# Patient Record
Sex: Male | Born: 1950
Health system: Southern US, Community
[De-identification: ages and names within clinical notes are randomized; demographics above are authoritative.]

## PROBLEM LIST (undated history)

## (undated) ENCOUNTER — Emergency Department (HOSPITAL_COMMUNITY): Admission: EM | Payer: Medicare Other | Source: Home / Self Care

## (undated) DIAGNOSIS — K5790 Diverticulosis of intestine, part unspecified, without perforation or abscess without bleeding: Secondary | ICD-10-CM

## (undated) DIAGNOSIS — I739 Peripheral vascular disease, unspecified: Secondary | ICD-10-CM

## (undated) DIAGNOSIS — I714 Abdominal aortic aneurysm, without rupture, unspecified: Secondary | ICD-10-CM

## (undated) DIAGNOSIS — I519 Heart disease, unspecified: Secondary | ICD-10-CM

## (undated) DIAGNOSIS — N2889 Other specified disorders of kidney and ureter: Secondary | ICD-10-CM

## (undated) DIAGNOSIS — Z8679 Personal history of other diseases of the circulatory system: Secondary | ICD-10-CM

## (undated) DIAGNOSIS — I10 Essential (primary) hypertension: Secondary | ICD-10-CM

## (undated) DIAGNOSIS — I7410 Embolism and thrombosis of unspecified parts of aorta: Secondary | ICD-10-CM

## (undated) DIAGNOSIS — C649 Malignant neoplasm of unspecified kidney, except renal pelvis: Secondary | ICD-10-CM

## (undated) DIAGNOSIS — I700 Atherosclerosis of aorta: Secondary | ICD-10-CM

## (undated) DIAGNOSIS — N281 Cyst of kidney, acquired: Secondary | ICD-10-CM

## (undated) DIAGNOSIS — Z905 Acquired absence of kidney: Secondary | ICD-10-CM

## (undated) DIAGNOSIS — I701 Atherosclerosis of renal artery: Secondary | ICD-10-CM

## (undated) DIAGNOSIS — K219 Gastro-esophageal reflux disease without esophagitis: Secondary | ICD-10-CM

## (undated) DIAGNOSIS — I517 Cardiomegaly: Secondary | ICD-10-CM

## (undated) DIAGNOSIS — E785 Hyperlipidemia, unspecified: Secondary | ICD-10-CM

## (undated) DIAGNOSIS — D1779 Benign lipomatous neoplasm of other sites: Secondary | ICD-10-CM

## (undated) DIAGNOSIS — G459 Transient cerebral ischemic attack, unspecified: Secondary | ICD-10-CM

## (undated) HISTORY — PX: VASCULAR SURGERY: SHX849

## (undated) HISTORY — PX: NEPHRECTOMY: SHX65

## (undated) HISTORY — DX: Atherosclerosis of renal artery: I70.1

## (undated) HISTORY — DX: Peripheral vascular disease, unspecified: I73.9

## (undated) HISTORY — PX: CHOLECYSTECTOMY: SHX55

## (undated) HISTORY — PX: COLONOSCOPY: SHX174

## (undated) HISTORY — DX: Hyperlipidemia, unspecified: E78.5

---

## 2013-04-24 DIAGNOSIS — G459 Transient cerebral ischemic attack, unspecified: Secondary | ICD-10-CM

## 2013-04-24 HISTORY — DX: Transient cerebral ischemic attack, unspecified: G45.9

## 2013-05-03 DIAGNOSIS — R200 Anesthesia of skin: Secondary | ICD-10-CM | POA: Insufficient documentation

## 2013-08-24 HISTORY — PX: RENAL ARTERY STENT: SHX2321

## 2013-12-07 ENCOUNTER — Encounter (HOSPITAL_COMMUNITY): Payer: Self-pay | Admitting: Emergency Medicine

## 2013-12-07 ENCOUNTER — Emergency Department (HOSPITAL_COMMUNITY): Payer: BC Managed Care – PPO

## 2013-12-07 ENCOUNTER — Emergency Department (HOSPITAL_COMMUNITY)
Admission: EM | Admit: 2013-12-07 | Discharge: 2013-12-07 | Disposition: A | Discharge status: Home / Self Care | Payer: BC Managed Care – PPO | Attending: Emergency Medicine | Admitting: Emergency Medicine

## 2013-12-07 DIAGNOSIS — I10 Essential (primary) hypertension: Secondary | ICD-10-CM | POA: Insufficient documentation

## 2013-12-07 DIAGNOSIS — E785 Hyperlipidemia, unspecified: Secondary | ICD-10-CM | POA: Insufficient documentation

## 2013-12-07 DIAGNOSIS — Z87891 Personal history of nicotine dependence: Secondary | ICD-10-CM | POA: Insufficient documentation

## 2013-12-07 DIAGNOSIS — Z8673 Personal history of transient ischemic attack (TIA), and cerebral infarction without residual deficits: Secondary | ICD-10-CM | POA: Insufficient documentation

## 2013-12-07 HISTORY — DX: Essential (primary) hypertension: I10

## 2013-12-07 HISTORY — DX: Transient cerebral ischemic attack, unspecified: G45.9

## 2013-12-07 LAB — CBC
HCT: 49.3 % (ref 39.0–52.0)
HEMOGLOBIN: 17.7 g/dL — AB (ref 13.0–17.0)
MCH: 33.1 pg (ref 26.0–34.0)
MCHC: 35.9 g/dL (ref 30.0–36.0)
MCV: 92.3 fL (ref 78.0–100.0)
Platelets: 144 10*3/uL — ABNORMAL LOW (ref 150–400)
RBC: 5.34 MIL/uL (ref 4.22–5.81)
RDW: 13.5 % (ref 11.5–15.5)
WBC: 8 10*3/uL (ref 4.0–10.5)

## 2013-12-07 LAB — I-STAT TROPONIN, ED: Troponin i, poc: 0 ng/mL (ref 0.00–0.08)

## 2013-12-07 LAB — I-STAT CHEM 8, ED
BUN: 19 mg/dL (ref 6–23)
CHLORIDE: 102 meq/L (ref 96–112)
CREATININE: 1.6 mg/dL — AB (ref 0.50–1.35)
Calcium, Ion: 1.17 mmol/L (ref 1.13–1.30)
GLUCOSE: 86 mg/dL (ref 70–99)
HEMATOCRIT: 52 % (ref 39.0–52.0)
Hemoglobin: 17.7 g/dL — ABNORMAL HIGH (ref 13.0–17.0)
Potassium: 3.8 mEq/L (ref 3.7–5.3)
SODIUM: 141 meq/L (ref 137–147)
TCO2: 29 mmol/L (ref 0–100)

## 2013-12-07 NOTE — ED Provider Notes (Signed)
CSN: [id number]     Arrival date & time 12/07/13  2045 History   First MD Initiated Contact with Patient 12/07/13 2139     Chief Complaint  Patient presents with  . Hypertension     (Consider location/radiation/quality/duration/timing/severity/associated sxs/prior Treatment) The history is provided by the patient and medical records.   History of present illness: 63 year old male who presented from urgent care for hypertension and concern for possible change on EKG. Patient presented to urgent care today because his blood pressure was elevated to 190/100. After getting off work today. He had seen his PCP yesterday who refilled his blood pressure medication. He was concerned that his blood pressure was still elevated so went to urgent care. He has not had any chest pain, shortness of breath, nausea, vomiting, diaphoresis today or at any time. EKG was obtained because patient was hypertensive. No history of coronary artery disease.  Past Medical History  Diagnosis Date  . Hypertension   . TIA (transient ischemic attack)    Past Surgical History  Procedure Laterality Date  . Cholecystectomy     History reviewed. No pertinent family history. History  Substance Use Topics  . Smoking status: Former Research scientist (life sciences)  . Smokeless tobacco: Not on file  . Alcohol Use: No    Review of Systems  Constitutional: Negative for fever and chills.  HENT: Negative.   Eyes: Negative.   Respiratory: Negative for cough and shortness of breath.   Cardiovascular: Negative for chest pain and palpitations.  Gastrointestinal: Negative for nausea, vomiting, abdominal pain, diarrhea and constipation.  Genitourinary: Negative.   Musculoskeletal: Negative.   Skin: Negative.   Neurological: Negative.   All other systems reviewed and are negative.     Allergies  Review of patient's allergies indicates no known allergies.  Home Medications   Prior to Admission medications   Not on File   BP 172/91  Pulse  59  Temp(Src) 98.5 F (36.9 C) (Oral)  Resp 18  Ht 6\' 2"  (1.88 m)  Wt 180 lb (81.647 kg)  BMI 23.10 kg/m2  SpO2 96% Physical Exam  Nursing note and vitals reviewed. Constitutional: He is oriented to person, place, and time. He appears well-developed and well-nourished. No distress.  HENT:  Head: Normocephalic and atraumatic.  Eyes: Conjunctivae are normal.  Neck: Neck supple.  Cardiovascular: Normal rate, regular rhythm, normal heart sounds and intact distal pulses.   Pulmonary/Chest: Effort normal and breath sounds normal. He has no wheezes. He has no rales.  Abdominal: Soft. He exhibits no distension. There is no tenderness.  Musculoskeletal: Normal range of motion.  Neurological: He is alert and oriented to person, place, and time.  Skin: Skin is warm and dry.    ED Course  Procedures (including critical care time) Labs Review Labs Reviewed  CBC - Abnormal; Notable for the following:    Hemoglobin 17.7 (*)    Platelets 144 (*)    All other components within normal limits  I-STAT CHEM 8, ED - Abnormal; Notable for the following:    Creatinine, Ser 1.60 (*)    Hemoglobin 17.7 (*)    All other components within normal limits  I-STAT TROPOININ, ED    Imaging Review Dg Chest 2 View  12/07/2013   CLINICAL DATA:  Right lower extremity numbness for several months  EXAM: CHEST  2 VIEW  COMPARISON:  None.  FINDINGS: The heart size and mediastinal contours are within normal limits. Both lungs are clear. The visualized skeletal structures are unremarkable.  IMPRESSION:  No active cardiopulmonary disease.   Electronically Signed   By: Kathreen Devoid   On: 12/07/2013 21:36     EKG Interpretation   Date/Time:  Thursday December 07 2013 21:01:11 EDT Ventricular Rate:  66 PR Interval:  144 QRS Duration: 92 QT Interval:  400 QTC Calculation: 419 R Axis:   76 Text Interpretation:  Normal sinus rhythm Normal ECG No old tracing to  compare Confirmed by GOLDSTON  MD, [place] ([id number]) on  12/07/2013 9:39:33 PM      MDM   Final diagnoses:  Hypertension    63 year old male with history of hypertension and hyperlipidemia was sent from urgent care with concern for possible EKG changes in the setting of hypertension. Patient has had no chest pain, shortness of breath, diaphoresis, nausea, vomiting, abdominal pain at any time. He presented to urgent care only because his blood pressure was high. EKG obtained at urgent care only because of patient's hypertension. On arrival here it was 184/95 with no intervention.  EKG from outside reviewed and new obtained here were essentially the same. Patient with only nonspecific T wave changes in aVL. No signs of acute ischemia on EKG.  Labs were obtained through triage as part of protocol. Troponin negative and labs otherwise unremarkable. As patient is asymptomatic and has had no history of chest pain or symptoms concerning for ACS, do not feel additional workup necessary at this time. Patient has close PCP followup arranged for the morning for adjustment of antihypertensives.    Date: 12/07/2013  Rate: 59  Rhythm: sinus bradycardia  QRS Axis: normal  Intervals: normal  ST/T Wave abnormalities: nonspecific T wave changes  Conduction Disutrbances:none  Narrative Interpretation: Non-specific T wave changes with otherwise normal EKG  Old EKG Reviewed: none available    Renaldo Reel, MD 12/07/13 2351

## 2013-12-07 NOTE — ED Notes (Signed)
Showed Dr. Wilson Singer [place] EKG and [place] 12 lead EKG

## 2013-12-07 NOTE — Discharge Instructions (Signed)
Followup with your doctor as soon as possible for adjustment of blood pressure medication.  SEEK IMMEDIATE MEDICAL ATTENTION IF: You have severe chest pain, especially if the pain is crushing or pressure-like and spreads to the arms, back, neck, or jaw, or if you have sweating, nausea (feeling sick to your stomach), or shortness of breath. THIS IS AN EMERGENCY. Don't wait to see if the pain will go away. Get medical help at once. Call 911 or 0 (operator). DO NOT drive yourself to the hospital.   You wake from sleep with chest pain or shortness of breath.  You feel dizzy or faint.  You have difficulty speaking. You have numbness, tingling, or weakness in your face, arms, or legs.

## 2013-12-07 NOTE — ED Notes (Signed)
Pt. Was transferred from ucc in [place] b/c of changes in 12 lead ecg. Pt. Had no cp on their visit but was given ntg sl. 0.4 mg and 325 mg asa. Pt. Went to ucc for just refills on bp meds. And asymptomatic.

## 2013-12-08 NOTE — ED Provider Notes (Signed)
I saw and evaluated the patient, reviewed the resident's note and I agree with the findings and plan.   EKG Interpretation   Date/Time:  Thursday December 07 2013 21:01:11 EDT Ventricular Rate:  66 PR Interval:  144 QRS Duration: 92 QT Interval:  400 QTC Calculation: 419 R Axis:   76 Text Interpretation:  Normal sinus rhythm Normal ECG No old tracing to  compare Confirmed by Milbern Doescher  MD, [place] Maiolo ([id number]) on 12/07/2013 9:39:33 PM       Patient with asymptomatic hypertension, referred from urgent care. EKG shows evidence of LVH but no acute ischemia. As such she also has no current chest symptoms and has not been having any chest symptoms recently. His blood pressure is poorly controlled and is on multiple blood pressure medicines. As he is asymptomatic and he is already scheduled to call his PCP tomorrow at I will discharge the patient and recommended close PCP first thing in the morning to help regulate better blood pressure control. I discussed return precautions including chest pain, stroke-like symptoms, dyspnea, or other concerning symptoms.  Ephraim Hamburger, MD 12/08/13 [phone]

## 2013-12-18 ENCOUNTER — Other Ambulatory Visit: Payer: Self-pay | Admitting: *Deleted

## 2013-12-18 DIAGNOSIS — M79609 Pain in unspecified limb: Secondary | ICD-10-CM

## 2013-12-18 DIAGNOSIS — I10 Essential (primary) hypertension: Secondary | ICD-10-CM

## 2013-12-18 DIAGNOSIS — R0989 Other specified symptoms and signs involving the circulatory and respiratory systems: Secondary | ICD-10-CM

## 2014-01-25 ENCOUNTER — Encounter: Payer: Self-pay | Admitting: Vascular Surgery

## 2014-01-26 ENCOUNTER — Ambulatory Visit (INDEPENDENT_AMBULATORY_CARE_PROVIDER_SITE_OTHER): Payer: BC Managed Care – PPO | Admitting: Vascular Surgery

## 2014-01-26 ENCOUNTER — Encounter (HOSPITAL_COMMUNITY): Payer: Self-pay | Admitting: Pharmacist

## 2014-01-26 ENCOUNTER — Ambulatory Visit (HOSPITAL_COMMUNITY)
Admission: RE | Admit: 2014-01-26 | Discharge: 2014-01-26 | Disposition: A | Discharge status: Home / Self Care | Payer: BC Managed Care – PPO | Source: Ambulatory Visit | Attending: Vascular Surgery | Admitting: Vascular Surgery

## 2014-01-26 ENCOUNTER — Ambulatory Visit (INDEPENDENT_AMBULATORY_CARE_PROVIDER_SITE_OTHER)
Admission: RE | Admit: 2014-01-26 | Discharge: 2014-01-26 | Disposition: A | Discharge status: Home / Self Care | Payer: BC Managed Care – PPO | Source: Ambulatory Visit | Attending: Vascular Surgery | Admitting: Vascular Surgery

## 2014-01-26 ENCOUNTER — Encounter: Payer: Self-pay | Admitting: Vascular Surgery

## 2014-01-26 VITALS — BP 168/85 | HR 49 | Ht 74.0 in | Wt 175.0 lb

## 2014-01-26 DIAGNOSIS — M79609 Pain in unspecified limb: Secondary | ICD-10-CM

## 2014-01-26 DIAGNOSIS — R0989 Other specified symptoms and signs involving the circulatory and respiratory systems: Secondary | ICD-10-CM | POA: Insufficient documentation

## 2014-01-26 DIAGNOSIS — I70219 Atherosclerosis of native arteries of extremities with intermittent claudication, unspecified extremity: Secondary | ICD-10-CM

## 2014-01-26 DIAGNOSIS — I701 Atherosclerosis of renal artery: Secondary | ICD-10-CM | POA: Insufficient documentation

## 2014-01-26 DIAGNOSIS — I10 Essential (primary) hypertension: Secondary | ICD-10-CM | POA: Insufficient documentation

## 2014-01-26 NOTE — Progress Notes (Signed)
Referred by:  [name]  Reason for referral: poorly controlled HTN and right leg numbness  History of Present Illness  Jeffrey Stout is a 63 y.o. (25-Mar-1951) male who presents with chief complaint: Right leg numbness.  Onset of symptom occurred years ago without obvious trigger.  Pt denies pain but notes numbness from right hip down to leg with ambulation.  Patient notes this resolves with rest.  The patient has no rest pain or classic intermittent claudication symptoms. He has never had ulcers or gangrene.  Additionally, this patient with known history of HTN since 63 y/o has had recently escalation in his BP despite being on 5 agents per the patient.  He was recently seen in the ED for poorly controlled BP.  The patient denies any change in urinary habits.  He denies any sx consistent with a pheochromocytoma.  Atherosclerotic risk factors include: HTN, prior smoking history.  Past Medical History  Diagnosis Date  . Hypertension   . TIA (transient ischemic attack)     Past Surgical History  Procedure Laterality Date  . Cholecystectomy      History   Social History  . Marital Status: Married    Spouse Name: N/A    Number of Children: N/A  . Years of Education: N/A   Occupational History  . Not on file.   Social History Main Topics  . Smoking status: Former Smoker    Quit date: 04/24/2013  . Smokeless tobacco: Not on file  . Alcohol Use: No  . Drug Use: No  . Sexual Activity: Not on file   Other Topics Concern  . Not on file   Social History Narrative  . No narrative on file    Family History  Problem Relation Age of Onset  . Diabetes Mother   . Hypertension Brother   . Heart attack Brother     Current Outpatient Prescriptions  Medication Sig Dispense Refill  . amLODipine (NORVASC) 10 MG tablet Take 10 mg by mouth daily.      . benazepril (LOTENSIN) 40 MG tablet Take 40 mg by mouth daily.      . carvedilol (COREG) 6.25 MG tablet Take 6.25 mg by  mouth.      . cloNIDine (CATAPRES - DOSED IN MG/24 HR) 0.1 mg/24hr patch Place 1 patch onto the skin.      . hydrochlorothiazide (HYDRODIURIL) 25 MG tablet Take 25 mg by mouth daily.      . pravastatin (PRAVACHOL) 40 MG tablet Take 40 mg by mouth.      Marland Kitchen aspirin 81 MG tablet Take 81 mg by mouth daily.       No current facility-administered medications for this visit.     No Known Allergies  REVIEW OF SYSTEMS:  (Positives checked otherwise negative)  CARDIOVASCULAR:  [ ]  chest pain, [ ]  chest pressure, [ ]  palpitations, [ ]  shortness of breath when laying flat, [x]  shortness of breath with exertion,   [x]  pain in feet when walking, [ ]  pain in feet when laying flat, [ ]  history of blood clot in veins (DVT), [ ]  history of phlebitis, [ ]  swelling in legs, [ ]  varicose veins  PULMONARY:  [ ]  productive cough, [ ]  asthma, [ ]  wheezing  NEUROLOGIC:  [ ]  weakness in arms or legs, [x]  numbness in arms or legs, [ ]  difficulty speaking or slurred speech, [ ]  temporary loss of vision in one eye, [ ]  dizziness  HEMATOLOGIC:  [ ]   bleeding problems, [ ]  problems with blood clotting too easily  MUSCULOSKEL:  [ ]  joint pain, [ ]  joint swelling  GASTROINTEST:  [ ]   Vomiting blood, [ ]   Blood in stool     GENITOURINARY:  [ ]   Burning with urination, [ ]   Blood in urine  PSYCHIATRIC:  [ ]  history of major depression  INTEGUMENTARY:  [ ]  rashes, [ ]  ulcers  CONSTITUTIONAL:  [ ]  fever, [ ]  chills  For VQI Use Only  PRE-ADM LIVING: Home  AMB STATUS: Ambulatory  CAD Sx: None  PRIOR CHF: None  STRESS TEST: [x]  No, [ ]  Normal, [ ]  + ischemia, [ ]  + MI, [ ]  Both  Physical Examination Filed Vitals:   01/26/14 0951  BP: 168/85  Pulse: 49  Height: 6\' 2"  (1.88 m)  Weight: 175 lb (79.379 kg)  SpO2: 100%   Body mass index is 22.46 kg/(m^2).  General: A&O x 3, WDWN  Head: [place]/AT  Ear/Nose/Throat: Hearing grossly intact, nares w/o erythema or drainage, oropharynx w/o  Erythema/Exudate  Eyes: PERRLA, EOMI  Neck: Supple, no nuchal rigidity, no palpable LAD  Pulmonary: Sym exp, good air movt, CTAB, no rales, rhonchi, & wheezing  Cardiac: RRR, Nl S1, S2, no Murmurs, rubs or gallops  Vascular: Vessel Right Left  Radial Palpable Palpable  Brachial  Palpable Palpable  Carotid Palpable, without bruit Palpable, without bruit  Aorta Not palpable N/A  Femoral Not Palpable Palpable  Popliteal Not palpable Not palpable  PT Palpable Palpable  DP Palpable Palpable   Gastrointestinal: soft, NTND, -G/R, - HSM, - masses, - CVAT B  Musculoskeletal: M/S 5/5 throughout , Extremities without ischemic changes   Neurologic: CN 2-12 intact , Pain and light touch intact in extremities , Motor exam as listed above  Psychiatric: Judgment intact, Mood & affect appropriatefor pt's clinical situation  Dermatologic: See M/S exam for extremity exam, no rashes otherwise noted  Lymph : No Cervical, Axillary, or Inguinal lymphadenopathy   Non-Invasive Vascular Imaging  ABI (Date: 01/26/2014)  R: 0.59, DP: mono, PT: mono, TBI: 0.49, R CFA: mono  L: 0.93, DP: bi, PT: bi, TBI: 0.71  B renal duplex (01/26/2014)  L: kidney 12.3 cm, RAR 5.7 c/w >60% stenosis, RA PSV 337 c/s  R: kidney 9.1 cm, RAR 1.2  Outside Studies/Documentation 3 pages of outside documents were reviewed including: outpatient PCP records.  Medical Decision Making  Jeffrey Stout is a 63 y.o. male who presents with: RLE PAD with likely inflow disease, malignant hypertension, possible L renal artery stenosis   The patient's right leg sx are no consistent with type intermittent claudication but his ABI are consistent with such.  He also likely has an inflow lesion in his R iliac system.  He should be evaluated for a possible mechanical or neurologic etiology for his right leg sx also.  I discussed with the patient the natural history of intermittent claudication: 75% of patients have stable or improved  symptoms in a year an only 2% require amputation. Eventually 20% may require intervention in a year.  I discussed in depth with the patient the nature of atherosclerosis, and emphasized the importance of maximal medical management including strict control of blood pressure, blood glucose, and lipid levels, antiplatelet agent, obtaining regular exercise, and cessation of smoking.    The patient is aware that without maximal medical management the underlying atherosclerotic disease process will progress, limiting the benefit of any interventions.  I discussed briefly a walking plan but I  doubt he will be able to complete such given his neurologic sx.  In regards to his malignant hypertension, it is concerning that on 4-5 agents this patient's BP remains elevated.  I have some questions of compliance in this patient.  The left renal duplex suggests a lesion, so I would recommend: Aortogram, bilateral leg runoff, possible left renal intervention, possible right iliac intervention. I discussed with the patient the nature of angiographic procedures, especially the limited patencies of any endovascular intervention.  The patient is aware of that the risks of an angiographic procedure include but are not limited to: bleeding, infection, access site complications, renal failure, embolization, rupture of vessel, dissection, possible need for emergent surgical intervention, possible need for surgical procedures to treat the patient's pathology, anaphylactic reaction to contrast, and stroke and death.   The patient is aware of the risks and agrees to proceed. The patient is currently on a statin: Pravachol. The patient is currently on an anti-platelet: ASA.  Thank you for allowing [place] to participate in this patient's care.  Adele Barthel, MD [facility] [place] Office: [phone] Pager: [phone]  01/26/2014, 10:28 AM

## 2014-01-31 ENCOUNTER — Other Ambulatory Visit: Payer: Self-pay

## 2014-01-31 MED ORDER — SODIUM CHLORIDE 0.9 % IV SOLN
INTRAVENOUS | Status: DC
  Administered 2014-02-01: 10:00:00 via INTRAVENOUS

## 2014-02-01 ENCOUNTER — Ambulatory Visit (HOSPITAL_COMMUNITY)
Admission: RE | Admit: 2014-02-01 | Discharge: 2014-02-01 | Disposition: A | Discharge status: Home / Self Care | Payer: BC Managed Care – PPO | Source: Ambulatory Visit | Attending: Vascular Surgery | Admitting: Vascular Surgery

## 2014-02-01 ENCOUNTER — Other Ambulatory Visit: Payer: Self-pay

## 2014-02-01 ENCOUNTER — Encounter (HOSPITAL_COMMUNITY)
Admission: RE | Disposition: A | Discharge status: Home / Self Care | Payer: BC Managed Care – PPO | Source: Ambulatory Visit | Attending: Vascular Surgery

## 2014-02-01 DIAGNOSIS — Z538 Procedure and treatment not carried out for other reasons: Secondary | ICD-10-CM | POA: Insufficient documentation

## 2014-02-01 DIAGNOSIS — Z87891 Personal history of nicotine dependence: Secondary | ICD-10-CM | POA: Insufficient documentation

## 2014-02-01 DIAGNOSIS — Z7982 Long term (current) use of aspirin: Secondary | ICD-10-CM | POA: Insufficient documentation

## 2014-02-01 DIAGNOSIS — Z8673 Personal history of transient ischemic attack (TIA), and cerebral infarction without residual deficits: Secondary | ICD-10-CM | POA: Insufficient documentation

## 2014-02-01 DIAGNOSIS — I10 Essential (primary) hypertension: Secondary | ICD-10-CM | POA: Insufficient documentation

## 2014-02-01 DIAGNOSIS — R209 Unspecified disturbances of skin sensation: Secondary | ICD-10-CM | POA: Insufficient documentation

## 2014-02-01 LAB — POCT I-STAT, CHEM 8
BUN: 33 mg/dL — ABNORMAL HIGH (ref 6–23)
Calcium, Ion: 1.17 mmol/L (ref 1.13–1.30)
Chloride: 103 mEq/L (ref 96–112)
Creatinine, Ser: 2.1 mg/dL — ABNORMAL HIGH (ref 0.50–1.35)
Glucose, Bld: 106 mg/dL — ABNORMAL HIGH (ref 70–99)
HCT: 46 % (ref 39.0–52.0)
Hemoglobin: 15.6 g/dL (ref 13.0–17.0)
POTASSIUM: 4 meq/L (ref 3.7–5.3)
SODIUM: 140 meq/L (ref 137–147)
TCO2: 22 mmol/L (ref 0–100)

## 2014-02-01 SURGERY — ABDOMINAL AORTAGRAM
Anesthesia: LOCAL

## 2014-02-01 NOTE — Interval H&P Note (Signed)
[facility] [place]  History and Physical Update  The patient was interviewed and re-examined.  The patient's previous History and Physical has been reviewed and is unchanged.  There is no change in the plan of care: Aortgram, bilateral leg runoff, possible right iliac and left renal intervention.  Adele Barthel, MD [facility] [place] Office: [phone] Pager: [phone]  02/01/2014, 9:42 AM

## 2014-02-01 NOTE — H&P (View-Only) (Signed)
Referred by:  [facility]  Reason for referral: poorly controlled HTN and right leg numbness  History of Present Illness  Jeffrey Stout is a 63 y.o. ([birth year]) male who presents with chief complaint: Right leg numbness.  Onset of symptom occurred years ago without obvious trigger.  Pt denies pain but notes numbness from right hip down to leg with ambulation.  Patient notes this resolves with rest.  The patient has no rest pain or classic intermittent claudication symptoms. He has never had ulcers or gangrene.  Additionally, this patient with known history of HTN since 63 y/o has had recently escalation in his BP despite being on 5 agents per the patient.  He was recently seen in the ED for poorly controlled BP.  The patient denies any change in urinary habits.  He denies any sx consistent with a pheochromocytoma.  Atherosclerotic risk factors include: HTN, prior smoking history.  Past Medical History  Diagnosis Date  . Hypertension   . TIA (transient ischemic attack)     Past Surgical History  Procedure Laterality Date  . Cholecystectomy      History   Social History  . Marital Status: Married    Spouse Name: N/A    Number of Children: N/A  . Years of Education: N/A   Occupational History  . Not on file.   Social History Main Topics  . Smoking status: Former Smoker    Quit date: 04/24/2013  . Smokeless tobacco: Not on file  . Alcohol Use: No  . Drug Use: No  . Sexual Activity: Not on file   Other Topics Concern  . Not on file   Social History Narrative  . No narrative on file    Family History  Problem Relation Age of Onset  . Diabetes Mother   . Hypertension Brother   . Heart attack Brother     Current Outpatient Prescriptions  Medication Sig Dispense Refill  . amLODipine (NORVASC) 10 MG tablet Take 10 mg by mouth daily.      . benazepril (LOTENSIN) 40 MG tablet Take 40 mg by mouth daily.      . carvedilol (COREG) 6.25 MG tablet Take 6.25 mg by  mouth.      . cloNIDine (CATAPRES - DOSED IN MG/24 HR) 0.1 mg/24hr patch Place 1 patch onto the skin.      . hydrochlorothiazide (HYDRODIURIL) 25 MG tablet Take 25 mg by mouth daily.      . pravastatin (PRAVACHOL) 40 MG tablet Take 40 mg by mouth.      Marland Kitchen aspirin 81 MG tablet Take 81 mg by mouth daily.       No current facility-administered medications for this visit.     No Known Allergies  REVIEW OF SYSTEMS:  (Positives checked otherwise negative)  CARDIOVASCULAR:  [ ]  chest pain, [ ]  chest pressure, [ ]  palpitations, [ ]  shortness of breath when laying flat, [x]  shortness of breath with exertion,   [x]  pain in feet when walking, [ ]  pain in feet when laying flat, [ ]  history of blood clot in veins (DVT), [ ]  history of phlebitis, [ ]  swelling in legs, [ ]  varicose veins  PULMONARY:  [ ]  productive cough, [ ]  asthma, [ ]  wheezing  NEUROLOGIC:  [ ]  weakness in arms or legs, [x]  numbness in arms or legs, [ ]  difficulty speaking or slurred speech, [ ]  temporary loss of vision in one eye, [ ]  dizziness  HEMATOLOGIC:  [ ]   bleeding problems, [ ]  problems with blood clotting too easily  MUSCULOSKEL:  [ ]  joint pain, [ ]  joint swelling  GASTROINTEST:  [ ]   Vomiting blood, [ ]   Blood in stool     GENITOURINARY:  [ ]   Burning with urination, [ ]   Blood in urine  PSYCHIATRIC:  [ ]  history of major depression  INTEGUMENTARY:  [ ]  rashes, [ ]  ulcers  CONSTITUTIONAL:  [ ]  fever, [ ]  chills  For VQI Use Only  PRE-ADM LIVING: Home  AMB STATUS: Ambulatory  CAD Sx: None  PRIOR CHF: None  STRESS TEST: [x]  No, [ ]  Normal, [ ]  + ischemia, [ ]  + MI, [ ]  Both  Physical Examination Filed Vitals:   01/26/14 0951  BP: 168/85  Pulse: 49  Height: 6\' 2"  (1.88 m)  Weight: 175 lb (79.379 kg)  SpO2: 100%   Body mass index is 22.46 kg/(m^2).  General: A&O x 3, WDWN  Head: [place]/AT  Ear/Nose/Throat: Hearing grossly intact, nares w/o erythema or drainage, oropharynx w/o  Erythema/Exudate  Eyes: PERRLA, EOMI  Neck: Supple, no nuchal rigidity, no palpable LAD  Pulmonary: Sym exp, good air movt, CTAB, no rales, rhonchi, & wheezing  Cardiac: RRR, Nl S1, S2, no Murmurs, rubs or gallops  Vascular: Vessel Right Left  Radial Palpable Palpable  Brachial  Palpable Palpable  Carotid Palpable, without bruit Palpable, without bruit  Aorta Not palpable N/A  Femoral Not Palpable Palpable  Popliteal Not palpable Not palpable  PT Palpable Palpable  DP Palpable Palpable   Gastrointestinal: soft, NTND, -G/R, - HSM, - masses, - CVAT B  Musculoskeletal: M/S 5/5 throughout , Extremities without ischemic changes   Neurologic: CN 2-12 intact , Pain and light touch intact in extremities , Motor exam as listed above  Psychiatric: Judgment intact, Mood & affect appropriatefor pt's clinical situation  Dermatologic: See M/S exam for extremity exam, no rashes otherwise noted  Lymph : No Cervical, Axillary, or Inguinal lymphadenopathy   Non-Invasive Vascular Imaging  ABI (Date: 01/26/2014)  R: 0.59, DP: mono, PT: mono, TBI: 0.49, R CFA: mono  L: 0.93, DP: bi, PT: bi, TBI: 0.71  B renal duplex (01/26/2014)  L: kidney 12.3 cm, RAR 5.7 c/w >60% stenosis, RA PSV 337 c/s  R: kidney 9.1 cm, RAR 1.2  Outside Studies/Documentation 3 pages of outside documents were reviewed including: outpatient PCP records.  Medical Decision Making  Jeffrey Stout is a 63 y.o. male who presents with: RLE PAD with likely inflow disease, malignant hypertension, possible L renal artery stenosis   The patient's right leg sx are no consistent with type intermittent claudication but his ABI are consistent with such.  He also likely has an inflow lesion in his R iliac system.  He should be evaluated for a possible mechanical or neurologic etiology for his right leg sx also.  I discussed with the patient the natural history of intermittent claudication: 75% of patients have stable or improved  symptoms in a year an only 2% require amputation. Eventually 20% may require intervention in a year.  I discussed in depth with the patient the nature of atherosclerosis, and emphasized the importance of maximal medical management including strict control of blood pressure, blood glucose, and lipid levels, antiplatelet agent, obtaining regular exercise, and cessation of smoking.    The patient is aware that without maximal medical management the underlying atherosclerotic disease process will progress, limiting the benefit of any interventions.  I discussed briefly a walking plan but I  doubt he will be able to complete such given his neurologic sx.  In regards to his malignant hypertension, it is concerning that on 4-5 agents this patient's BP remains elevated.  I have some questions of compliance in this patient.  The left renal duplex suggests a lesion, so I would recommend: Aortogram, bilateral leg runoff, possible left renal intervention, possible right iliac intervention. I discussed with the patient the nature of angiographic procedures, especially the limited patencies of any endovascular intervention.  The patient is aware of that the risks of an angiographic procedure include but are not limited to: bleeding, infection, access site complications, renal failure, embolization, rupture of vessel, dissection, possible need for emergent surgical intervention, possible need for surgical procedures to treat the patient's pathology, anaphylactic reaction to contrast, and stroke and death.   The patient is aware of the risks and agrees to proceed. The patient is currently on a statin: Pravachol. The patient is currently on an anti-platelet: ASA.  Thank you for allowing [place] to participate in this patient's care.  Adele Barthel, MD [facility] [place] Office: [phone] Pager: [phone]  01/26/2014, 10:28 AM

## 2014-02-07 MED FILL — Sodium Chloride IV Soln 0.9%: INTRAVENOUS | Qty: 1000 | Status: AC

## 2014-02-13 ENCOUNTER — Encounter (HOSPITAL_COMMUNITY): Payer: Self-pay | Admitting: Pharmacy Technician

## 2014-02-14 ENCOUNTER — Observation Stay (HOSPITAL_COMMUNITY)
Admission: RE | Admit: 2014-02-14 | Discharge: 2014-02-16 | Disposition: A | Discharge status: Home / Self Care | Payer: BC Managed Care – PPO | Source: Ambulatory Visit | Attending: Vascular Surgery | Admitting: Vascular Surgery

## 2014-02-14 ENCOUNTER — Encounter (HOSPITAL_COMMUNITY): Payer: Self-pay | Admitting: General Practice

## 2014-02-14 ENCOUNTER — Ambulatory Visit (HOSPITAL_COMMUNITY)
Admission: RE | Admit: 2014-02-14 | Payer: BC Managed Care – PPO | Source: Ambulatory Visit | Admitting: Vascular Surgery

## 2014-02-14 DIAGNOSIS — I10 Essential (primary) hypertension: Secondary | ICD-10-CM | POA: Insufficient documentation

## 2014-02-14 DIAGNOSIS — Z7982 Long term (current) use of aspirin: Secondary | ICD-10-CM | POA: Insufficient documentation

## 2014-02-14 DIAGNOSIS — Z87891 Personal history of nicotine dependence: Secondary | ICD-10-CM | POA: Insufficient documentation

## 2014-02-14 DIAGNOSIS — I739 Peripheral vascular disease, unspecified: Secondary | ICD-10-CM | POA: Diagnosis present

## 2014-02-14 DIAGNOSIS — Z8673 Personal history of transient ischemic attack (TIA), and cerebral infarction without residual deficits: Secondary | ICD-10-CM | POA: Insufficient documentation

## 2014-02-14 DIAGNOSIS — I70219 Atherosclerosis of native arteries of extremities with intermittent claudication, unspecified extremity: Principal | ICD-10-CM | POA: Insufficient documentation

## 2014-02-14 DIAGNOSIS — I701 Atherosclerosis of renal artery: Secondary | ICD-10-CM | POA: Insufficient documentation

## 2014-02-14 LAB — COMPREHENSIVE METABOLIC PANEL
ALBUMIN: 4.2 g/dL (ref 3.5–5.2)
ALT: 23 U/L (ref 0–53)
AST: 17 U/L (ref 0–37)
Alkaline Phosphatase: 76 U/L (ref 39–117)
BILIRUBIN TOTAL: 0.4 mg/dL (ref 0.3–1.2)
BUN: 22 mg/dL (ref 6–23)
CHLORIDE: 101 meq/L (ref 96–112)
CO2: 26 mEq/L (ref 19–32)
CREATININE: 1.56 mg/dL — AB (ref 0.50–1.35)
Calcium: 9.6 mg/dL (ref 8.4–10.5)
GFR, EST AFRICAN AMERICAN: 53 mL/min — AB (ref >90)
GFR, EST NON AFRICAN AMERICAN: 46 mL/min — AB (ref >90)
GLUCOSE: 81 mg/dL (ref 70–99)
Potassium: 4 mEq/L (ref 3.7–5.3)
Sodium: 140 mEq/L (ref 137–147)
Total Protein: 7.3 g/dL (ref 6.0–8.3)

## 2014-02-14 LAB — CBC
HEMATOCRIT: 46.6 % (ref 39.0–52.0)
Hemoglobin: 16.5 g/dL (ref 13.0–17.0)
MCH: 32.6 pg (ref 26.0–34.0)
MCHC: 35.4 g/dL (ref 30.0–36.0)
MCV: 92.1 fL (ref 78.0–100.0)
Platelets: 142 10*3/uL — ABNORMAL LOW (ref 150–400)
RBC: 5.06 MIL/uL (ref 4.22–5.81)
RDW: 12.8 % (ref 11.5–15.5)
WBC: 6.1 10*3/uL (ref 4.0–10.5)

## 2014-02-14 LAB — PROTIME-INR
INR: 0.99 (ref 0.00–1.49)
PROTHROMBIN TIME: 13.1 s (ref 11.6–15.2)

## 2014-02-14 MED ORDER — ACETAMINOPHEN 650 MG RE SUPP: 325.0000 mg | RECTAL | Status: DC | PRN

## 2014-02-14 MED ORDER — ASPIRIN EC 81 MG PO TBEC
81.0000 mg | DELAYED_RELEASE_TABLET | Freq: Every day | ORAL | Status: DC
  Administered 2014-02-15 – 2014-02-16 (×2): 81 mg via ORAL
  Filled 2014-02-14 (×2): qty 1

## 2014-02-14 MED ORDER — OXYCODONE HCL 5 MG PO TABS
5.0000 mg | ORAL_TABLET | ORAL | Status: DC | PRN
  Administered 2014-02-15: 5 mg via ORAL
  Filled 2014-02-14: qty 1

## 2014-02-14 MED ORDER — LABETALOL HCL 5 MG/ML IV SOLN
10.0000 mg | INTRAVENOUS | Status: DC | PRN
  Filled 2014-02-14: qty 4

## 2014-02-14 MED ORDER — CARVEDILOL 6.25 MG PO TABS
6.2500 mg | ORAL_TABLET | Freq: Two times a day (BID) | ORAL | Status: DC
  Administered 2014-02-15 – 2014-02-16 (×2): 6.25 mg via ORAL
  Filled 2014-02-14 (×6): qty 1

## 2014-02-14 MED ORDER — ENOXAPARIN SODIUM 40 MG/0.4ML ~~LOC~~ SOLN
40.0000 mg | SUBCUTANEOUS | Status: DC
  Administered 2014-02-14 – 2014-02-15 (×2): 40 mg via SUBCUTANEOUS
  Filled 2014-02-14 (×4): qty 0.4

## 2014-02-14 MED ORDER — ONDANSETRON HCL 4 MG/2ML IJ SOLN: 4.0000 mg | Freq: Four times a day (QID) | INTRAMUSCULAR | Status: DC | PRN

## 2014-02-14 MED ORDER — PHENOL 1.4 % MT LIQD
1.0000 | OROMUCOSAL | Status: DC | PRN
  Filled 2014-02-14: qty 177

## 2014-02-14 MED ORDER — ALUM & MAG HYDROXIDE-SIMETH 200-200-20 MG/5ML PO SUSP: 15.0000 mL | ORAL | Status: DC | PRN

## 2014-02-14 MED ORDER — AMLODIPINE BESYLATE 10 MG PO TABS
10.0000 mg | ORAL_TABLET | Freq: Every day | ORAL | Status: DC
  Administered 2014-02-15 – 2014-02-16 (×2): 10 mg via ORAL
  Filled 2014-02-14 (×2): qty 1

## 2014-02-14 MED ORDER — SIMVASTATIN 20 MG PO TABS
20.0000 mg | ORAL_TABLET | Freq: Every day | ORAL | Status: DC
  Administered 2014-02-14 – 2014-02-15 (×2): 20 mg via ORAL
  Filled 2014-02-14 (×3): qty 1

## 2014-02-14 MED ORDER — HYDRALAZINE HCL 20 MG/ML IJ SOLN
10.0000 mg | INTRAMUSCULAR | Status: DC | PRN
Start: 2014-02-14 — End: 2014-02-16

## 2014-02-14 MED ORDER — HYDROCHLOROTHIAZIDE 25 MG PO TABS
25.0000 mg | ORAL_TABLET | Freq: Every day | ORAL | Status: DC
  Administered 2014-02-15: 25 mg via ORAL
  Filled 2014-02-14 (×3): qty 1

## 2014-02-14 MED ORDER — PANTOPRAZOLE SODIUM 40 MG PO TBEC
40.0000 mg | DELAYED_RELEASE_TABLET | Freq: Every day | ORAL | Status: DC
  Administered 2014-02-15: 40 mg via ORAL
  Filled 2014-02-14 (×2): qty 1

## 2014-02-14 MED ORDER — SODIUM CHLORIDE 0.9 % IV SOLN
INTRAVENOUS | Status: DC
  Administered 2014-02-14 – 2014-02-16 (×4): via INTRAVENOUS

## 2014-02-14 MED ORDER — CLONIDINE HCL 0.1 MG/24HR TD PTWK
0.1000 mg | MEDICATED_PATCH | TRANSDERMAL | Status: DC
  Administered 2014-02-15: 0.1 mg via TRANSDERMAL
  Filled 2014-02-14: qty 1

## 2014-02-14 MED ORDER — ACETAMINOPHEN 325 MG PO TABS: 325.0000 mg | ORAL_TABLET | ORAL | Status: DC | PRN

## 2014-02-14 MED ORDER — METOPROLOL TARTRATE 1 MG/ML IV SOLN: 2.0000 mg | INTRAVENOUS | Status: DC | PRN

## 2014-02-14 MED ORDER — GUAIFENESIN-DM 100-10 MG/5ML PO SYRP: 15.0000 mL | ORAL_SOLUTION | ORAL | Status: DC | PRN

## 2014-02-14 NOTE — H&P (Signed)
[facility] [place]  History and Physical Update  The patient was interviewed and re-examined.  The patient's previous History and Physical has been reviewed and is unchanged from my consult on: 01/26/14 except for: interval cancellation last week due to renal insufficiency.  There is no change in the plan of care: aortogram, possible left renal angiogram, possible intervention, and possible right iliac intervention.  Adele Barthel, MD [facility] [place] Office: [phone] Pager: [phone]  02/14/2014, 1:42 PM '

## 2014-02-15 ENCOUNTER — Encounter (HOSPITAL_COMMUNITY)
Admission: RE | Disposition: A | Discharge status: Home / Self Care | Payer: Self-pay | Source: Ambulatory Visit | Attending: Vascular Surgery

## 2014-02-15 ENCOUNTER — Other Ambulatory Visit: Payer: Self-pay | Admitting: *Deleted

## 2014-02-15 DIAGNOSIS — I739 Peripheral vascular disease, unspecified: Secondary | ICD-10-CM

## 2014-02-15 DIAGNOSIS — I70219 Atherosclerosis of native arteries of extremities with intermittent claudication, unspecified extremity: Secondary | ICD-10-CM

## 2014-02-15 DIAGNOSIS — Z7689 Persons encountering health services in other specified circumstances: Secondary | ICD-10-CM

## 2014-02-15 HISTORY — PX: ABDOMINAL AORTAGRAM: SHX5454

## 2014-02-15 LAB — CBC
HCT: 41.8 % (ref 39.0–52.0)
HEMATOCRIT: 44.1 % (ref 39.0–52.0)
HEMOGLOBIN: 15.6 g/dL (ref 13.0–17.0)
Hemoglobin: 14.7 g/dL (ref 13.0–17.0)
MCH: 32.1 pg (ref 26.0–34.0)
MCH: 32.4 pg (ref 26.0–34.0)
MCHC: 35.2 g/dL (ref 30.0–36.0)
MCHC: 35.4 g/dL (ref 30.0–36.0)
MCV: 91.3 fL (ref 78.0–100.0)
MCV: 91.5 fL (ref 78.0–100.0)
PLATELETS: 124 10*3/uL — AB (ref 150–400)
Platelets: 133 10*3/uL — ABNORMAL LOW (ref 150–400)
RBC: 4.58 MIL/uL (ref 4.22–5.81)
RBC: 4.82 MIL/uL (ref 4.22–5.81)
RDW: 12.9 % (ref 11.5–15.5)
RDW: 12.7 % (ref 11.5–15.5)
WBC: 6.2 10*3/uL (ref 4.0–10.5)
WBC: 8.6 10*3/uL (ref 4.0–10.5)

## 2014-02-15 LAB — BASIC METABOLIC PANEL
BUN: 21 mg/dL (ref 6–23)
CALCIUM: 8.8 mg/dL (ref 8.4–10.5)
CO2: 26 meq/L (ref 19–32)
Chloride: 104 mEq/L (ref 96–112)
Creatinine, Ser: 1.65 mg/dL — ABNORMAL HIGH (ref 0.50–1.35)
GFR calc Af Amer: 49 mL/min — ABNORMAL LOW (ref >90)
GFR calc non Af Amer: 43 mL/min — ABNORMAL LOW (ref >90)
Glucose, Bld: 145 mg/dL — ABNORMAL HIGH (ref 70–99)
POTASSIUM: 3.5 meq/L — AB (ref 3.7–5.3)
Sodium: 142 mEq/L (ref 137–147)

## 2014-02-15 LAB — POCT ACTIVATED CLOTTING TIME
ACTIVATED CLOTTING TIME: 174 s
Activated Clotting Time: 220 seconds

## 2014-02-15 LAB — CREATININE, SERUM
Creatinine, Ser: 1.47 mg/dL — ABNORMAL HIGH (ref 0.50–1.35)
GFR calc Af Amer: 57 mL/min — ABNORMAL LOW (ref >90)
GFR calc non Af Amer: 49 mL/min — ABNORMAL LOW (ref >90)

## 2014-02-15 SURGERY — ABDOMINAL AORTAGRAM
Anesthesia: LOCAL

## 2014-02-15 MED ORDER — LIDOCAINE HCL (PF) 1 % IJ SOLN
INTRAMUSCULAR | Status: AC
  Filled 2014-02-15: qty 30

## 2014-02-15 MED ORDER — CLOPIDOGREL BISULFATE 300 MG PO TABS
300.0000 mg | ORAL_TABLET | Freq: Once | ORAL | Status: AC
  Administered 2014-02-15: 300 mg via ORAL
  Filled 2014-02-15: qty 1

## 2014-02-15 MED ORDER — ONDANSETRON HCL 4 MG/2ML IJ SOLN
INTRAMUSCULAR | Status: AC
  Filled 2014-02-15: qty 2

## 2014-02-15 MED ORDER — CLOPIDOGREL BISULFATE 75 MG PO TABS
75.0000 mg | ORAL_TABLET | Freq: Every day | ORAL | Status: DC
Start: 2014-02-16 — End: 2014-02-16
  Administered 2014-02-16: 75 mg via ORAL
  Filled 2014-02-15: qty 1

## 2014-02-15 MED ORDER — HEPARIN SODIUM (PORCINE) 1000 UNIT/ML IJ SOLN
INTRAMUSCULAR | Status: AC
  Filled 2014-02-15: qty 1

## 2014-02-15 MED ORDER — HEPARIN (PORCINE) IN NACL 2-0.9 UNIT/ML-% IJ SOLN
INTRAMUSCULAR | Status: AC
  Filled 2014-02-15: qty 1000

## 2014-02-15 MED ORDER — MIDAZOLAM HCL 2 MG/2ML IJ SOLN
INTRAMUSCULAR | Status: AC
  Filled 2014-02-15: qty 2

## 2014-02-15 MED ORDER — ENOXAPARIN SODIUM 40 MG/0.4ML ~~LOC~~ SOLN: 40.0000 mg | SUBCUTANEOUS | Status: DC

## 2014-02-15 MED ORDER — FENTANYL CITRATE 0.05 MG/ML IJ SOLN
INTRAMUSCULAR | Status: AC
  Filled 2014-02-15: qty 2

## 2014-02-15 MED ORDER — NITROGLYCERIN 0.2 MG/ML ON CALL CATH LAB
INTRAVENOUS | Status: AC
  Filled 2014-02-15: qty 1

## 2014-02-15 NOTE — Progress Notes (Signed)
Called Dr. Bridgett Larsson regarding HR of 58 and bp in 180's/90's and Dr. Bridgett Larsson stated it was okay to apply the pt's clonidine patch and give him his coreg.  Both were administered.  Will continue to monitor.

## 2014-02-15 NOTE — H&P (View-Only) (Signed)
Referred by:  [facility]  Reason for referral: poorly controlled HTN and right leg numbness  History of Present Illness  Jeffrey Stout is a 63 y.o. (1950/12/18) male who presents with chief complaint: Right leg numbness.  Onset of symptom occurred years ago without obvious trigger.  Pt denies pain but notes numbness from right hip down to leg with ambulation.  Patient notes this resolves with rest.  The patient has no rest pain or classic intermittent claudication symptoms. He has never had ulcers or gangrene.  Additionally, this patient with known history of HTN since 63 y/o has had recently escalation in his BP despite being on 5 agents per the patient.  He was recently seen in the ED for poorly controlled BP.  The patient denies any change in urinary habits.  He denies any sx consistent with a pheochromocytoma.  Atherosclerotic risk factors include: HTN, prior smoking history.  Past Medical History  Diagnosis Date  . Hypertension   . TIA (transient ischemic attack)     Past Surgical History  Procedure Laterality Date  . Cholecystectomy      History   Social History  . Marital Status: Married    Spouse Name: N/A    Number of Children: N/A  . Years of Education: N/A   Occupational History  . Not on file.   Social History Main Topics  . Smoking status: Former Smoker    Quit date: 04/24/2013  . Smokeless tobacco: Not on file  . Alcohol Use: No  . Drug Use: No  . Sexual Activity: Not on file   Other Topics Concern  . Not on file   Social History Narrative  . No narrative on file    Family History  Problem Relation Age of Onset  . Diabetes Mother   . Hypertension Brother   . Heart attack Brother     Current Outpatient Prescriptions  Medication Sig Dispense Refill  . amLODipine (NORVASC) 10 MG tablet Take 10 mg by mouth daily.      . benazepril (LOTENSIN) 40 MG tablet Take 40 mg by mouth daily.      . carvedilol (COREG) 6.25 MG tablet Take 6.25 mg by  mouth.      . cloNIDine (CATAPRES - DOSED IN MG/24 HR) 0.1 mg/24hr patch Place 1 patch onto the skin.      . hydrochlorothiazide (HYDRODIURIL) 25 MG tablet Take 25 mg by mouth daily.      . pravastatin (PRAVACHOL) 40 MG tablet Take 40 mg by mouth.      Marland Kitchen aspirin 81 MG tablet Take 81 mg by mouth daily.       No current facility-administered medications for this visit.     No Known Allergies  REVIEW OF SYSTEMS:  (Positives checked otherwise negative)  CARDIOVASCULAR:  [ ]  chest pain, [ ]  chest pressure, [ ]  palpitations, [ ]  shortness of breath when laying flat, [x]  shortness of breath with exertion,   [x]  pain in feet when walking, [ ]  pain in feet when laying flat, [ ]  history of blood clot in veins (DVT), [ ]  history of phlebitis, [ ]  swelling in legs, [ ]  varicose veins  PULMONARY:  [ ]  productive cough, [ ]  asthma, [ ]  wheezing  NEUROLOGIC:  [ ]  weakness in arms or legs, [x]  numbness in arms or legs, [ ]  difficulty speaking or slurred speech, [ ]  temporary loss of vision in one eye, [ ]  dizziness  HEMATOLOGIC:  [ ]   bleeding problems, [ ]  problems with blood clotting too easily  MUSCULOSKEL:  [ ]  joint pain, [ ]  joint swelling  GASTROINTEST:  [ ]   Vomiting blood, [ ]   Blood in stool     GENITOURINARY:  [ ]   Burning with urination, [ ]   Blood in urine  PSYCHIATRIC:  [ ]  history of major depression  INTEGUMENTARY:  [ ]  rashes, [ ]  ulcers  CONSTITUTIONAL:  [ ]  fever, [ ]  chills  For VQI Use Only  PRE-ADM LIVING: Home  AMB STATUS: Ambulatory  CAD Sx: None  PRIOR CHF: None  STRESS TEST: [x]  No, [ ]  Normal, [ ]  + ischemia, [ ]  + MI, [ ]  Both  Physical Examination Filed Vitals:   01/26/14 0951  BP: 168/85  Pulse: 49  Height: 6\' 2"  (1.88 m)  Weight: 175 lb (79.379 kg)  SpO2: 100%   Body mass index is 22.46 kg/(m^2).  General: A&O x 3, WDWN  Head: [place]/AT  Ear/Nose/Throat: Hearing grossly intact, nares w/o erythema or drainage, oropharynx w/o  Erythema/Exudate  Eyes: PERRLA, EOMI  Neck: Supple, no nuchal rigidity, no palpable LAD  Pulmonary: Sym exp, good air movt, CTAB, no rales, rhonchi, & wheezing  Cardiac: RRR, Nl S1, S2, no Murmurs, rubs or gallops  Vascular: Vessel Right Left  Radial Palpable Palpable  Brachial  Palpable Palpable  Carotid Palpable, without bruit Palpable, without bruit  Aorta Not palpable N/A  Femoral Not Palpable Palpable  Popliteal Not palpable Not palpable  PT Palpable Palpable  DP Palpable Palpable   Gastrointestinal: soft, NTND, -G/R, - HSM, - masses, - CVAT B  Musculoskeletal: M/S 5/5 throughout , Extremities without ischemic changes   Neurologic: CN 2-12 intact , Pain and light touch intact in extremities , Motor exam as listed above  Psychiatric: Judgment intact, Mood & affect appropriatefor pt's clinical situation  Dermatologic: See M/S exam for extremity exam, no rashes otherwise noted  Lymph : No Cervical, Axillary, or Inguinal lymphadenopathy   Non-Invasive Vascular Imaging  ABI (Date: 01/26/2014)  R: 0.59, DP: mono, PT: mono, TBI: 0.49, R CFA: mono  L: 0.93, DP: bi, PT: bi, TBI: 0.71  B renal duplex (01/26/2014)  L: kidney 12.3 cm, RAR 5.7 c/w >60% stenosis, RA PSV 337 c/s  R: kidney 9.1 cm, RAR 1.2  Outside Studies/Documentation 3 pages of outside documents were reviewed including: outpatient PCP records.  Medical Decision Making  Jeffrey Stout is a 63 y.o. male who presents with: RLE PAD with likely inflow disease, malignant hypertension, possible L renal artery stenosis   The patient's right leg sx are no consistent with type intermittent claudication but his ABI are consistent with such.  He also likely has an inflow lesion in his R iliac system.  He should be evaluated for a possible mechanical or neurologic etiology for his right leg sx also.  I discussed with the patient the natural history of intermittent claudication: 75% of patients have stable or improved  symptoms in a year an only 2% require amputation. Eventually 20% may require intervention in a year.  I discussed in depth with the patient the nature of atherosclerosis, and emphasized the importance of maximal medical management including strict control of blood pressure, blood glucose, and lipid levels, antiplatelet agent, obtaining regular exercise, and cessation of smoking.    The patient is aware that without maximal medical management the underlying atherosclerotic disease process will progress, limiting the benefit of any interventions.  I discussed briefly a walking plan but I  doubt he will be able to complete such given his neurologic sx.  In regards to his malignant hypertension, it is concerning that on 4-5 agents this patient's BP remains elevated.  I have some questions of compliance in this patient.  The left renal duplex suggests a lesion, so I would recommend: Aortogram, bilateral leg runoff, possible left renal intervention, possible right iliac intervention. I discussed with the patient the nature of angiographic procedures, especially the limited patencies of any endovascular intervention.  The patient is aware of that the risks of an angiographic procedure include but are not limited to: bleeding, infection, access site complications, renal failure, embolization, rupture of vessel, dissection, possible need for emergent surgical intervention, possible need for surgical procedures to treat the patient's pathology, anaphylactic reaction to contrast, and stroke and death.   The patient is aware of the risks and agrees to proceed. The patient is currently on a statin: Pravachol. The patient is currently on an anti-platelet: ASA.  Thank you for allowing [place] to participate in this patient's care.  Adele Barthel, MD [facility] [place] Office: [phone] Pager: [phone]  01/26/2014, 10:28 AM

## 2014-02-15 NOTE — Op Note (Signed)
OPERATIVE NOTE   PROCEDURE: 1.  Bilateral common femoral artery cannulation under ultrasound guidance 2.  Placement of catheter in aorta 3.  Aortogram 4.  Angioplasty and stenting left common iliac artery (iCAST 10 mm x 38 mm) 5.  Left renal angiogram 6.  Left angioplasty and stenting left renal artery (6 mm x 12 mm)  PRE-OPERATIVE DIAGNOSIS: poorly controlled hypertension, right leg claudication  POST-OPERATIVE DIAGNOSIS: same as above   SURGEON: Adele Barthel, MD  ANESTHESIA: conscious sedation  ESTIMATED BLOOD LOSS: 50 cc  CONTRAST: 115 cc  FINDING(S):  Aorta: Patent but diffusely disease with extensive calcification, intrarenal stenoses <50%, <30%  Superior mesenteric artery: Incompletely imaged Celiac artery: Patent   Right Left  RA Not visualized Heavily calcified disease shortly after orifice (>75%): resolved with stenting  CIA Occluded chronically with heavy calcification Patent with 75-90% stenosis with heavy calcification: resolved after stenting  EIA Reconstituted via collaterals via lumbar artery Patent  IIA Patent Patent with orifical stenosis 50-75%  CFA Patent Patent   SPECIMEN(S):  none  INDICATIONS:   Jeffrey Stout is a 63 y.o. male who presents with poorly controlled hypertension requiring 5 medications for control and lifestyle limiting claudication and likely right iliac artery occlusion.  The patient presents for: aortogram, bilateral leg runoff, and possible left renal intervention and possible right iliac intervention.  The patient was initially scheduled two weeks ago but he had acute worsening of his renal function so he was rescheduled.  I discussed with the patient the nature of angiographic procedures, especially the limited patencies of any endovascular intervention.  The patient is aware of that the risks of an angiographic procedure include but are not limited to: bleeding, infection, access site complications, renal failure, embolization,  rupture of vessel, dissection, possible need for emergent surgical intervention, possible need for surgical procedures to treat the patient's pathology, and stroke and death.  The patient is aware of the risks and agrees to proceed.  DESCRIPTION: After full informed consent was obtained from the patient, the patient was brought back to the angiography suite.  The patient was placed supine upon the angiography table and connected to monitoring equipment.  The patient was then given conscious sedation, the amounts of which are documented in the patient's chart.  The patient was prepped and drape in the standard fashion for an angiographic procedure.  At this point, attention was turned to the left groin.  Under ultrasound guidance, the left common femoral artery was cannulated with a micropuncture needle.  The microwire was advanced into the iliac arterial system.  The needle was exchanged for a microsheath, which was loaded into the common femoral artery over the wire.  The microwire was exchanged for a [facility] wire which was advanced into the common iliac artery.  The microsheath was then exchanged for a 5-Fr sheath which was loaded into the common femoral artery.  The [facility] wire would not advance any further up the left iliac system, so I exchanged it for a Glidewire and KMP catheter.  Using this combination, I able to traverse a tight stenosis in the left common iliac artery.  With difficulty, the Omniflush catheter was then loaded over the wire up to the level of L1.  The catheter was connected to the power injector circuit.  After de-airring and de-clotting the circuit, a carbon dioxide aortogram was completed.  This demonstrated near occlusion of left renal artery and right common iliac artery, so I felt contrast was going to be needed.  The catheter was connected to the power injector circuit.  An power injector aortogram was completed.  The findings are listed above.  I felt an attempt at endovascular  recannulation of the right common iliac artery was indicated, though likely to failed due to the extensive calcification in the right common iliac artery.  Under ultrasound guidance, the right common femoral artery was cannulated with a micropuncture needle.  The microwire was advanced into the iliac arterial system.  The needle was exchanged for a microsheath, which was loaded into the common femoral artery over the wire.  The microwire was exchanged for a [facility] wire which was advanced into the aorta.  The microsheath was then exchanged for a 5-Fr sheath which was loaded into the common femoral artery.  I placed the glidewire and KMP in the right sheath.  Despite multiple attempts, the right common iliac artery occlusion could not be traversed.    At this point, my plan was to intervene on the left common iliac artery to facilitate a future femorofemoral bypass.  I exchanged the left wire for a Rosen wire.  Based on measurements, a 10 mm x 38 mm iCAST stent was selected as I had concerns the left common iliac artery would rupture due to the severe calcification.  The left sheath was exchanged for a long 7-Fr sheath.  The patient was given 7000 units of Heparin intravenously, which was a therapeutic bolus.  In total, 9000 units of Heparin was administrated to achieve and maintain a therapeutic level of anticoagulation.  I did another hand injection to verify position of the stenosis.  I positioned the stent to extend from the orifice of left common iliac artery to distal to the stenosis.  The stent was deployed at 14 atm.  Hand injection demonstrated near resolution of the lesion.    At this point, I felt further interrogation of the left renal artery was necessary.  I exchanged the long 7-Fr sheath for a short 7-Fr sheath.  Using a SOS catheter and glidewire, I selected the renal artery.  The wire would not advance, however.  I did a hand injection which verified >75% heavily calcified left renal artery  orifice with severe disease extending into the proximal 1-1.5 cm of the left renal artery.  The orifice had an asymmetric funnel anatomy, so unfortunately positioning the stent to address the entire inflow would require extending the stent more into the aorta than usual.  The wire was exchanged for a Versacore wire and the catheter was exchanged double curved renal guide.  Using the guide and Versacore, I was able to get into the renal artery main channel.  I placed an endhole catheter over the wire and exchanged the wire for a 0.014" stabilizer wire.  I exchanged the catheter for 6 mm x 1.2 mm uncovered balloon expandable stent.  I engaged the renal artery and delivered the stent.  I then pulled the guide back and did a hand injection.  I adjusted the stent position so it aligned with lower edge of the renal artery.  The stent was deployed at nominal pressure, demonstrating significant waist in the process.  I deflated the balloon and recaptured it with the guide.  Complete angiogram demonstrated patent renal artery stent with substantial calcific disease surrounding the stent.  As anticipated, the lower edge of the stent was aligned with renal artery but the superior edge was protruding 3 mm into the lumen.  As this was an open cell stent, no obstruction  of blood flow was visualized.  At this point, I pulled back the guide and wire.  Both sheaths were aspirated.  No clots were present in either sheath, and both sheaths were reloaded with heparinized saline.  The plan is to obtain preoperative cardiac optimization prior to proceeding with left-to-right femorofemoral bypass.  The patient will remain admitted overnight to continue hydration and observe for possible drop in blood pressure due to correction of left renal artery stenosis.  COMPLICATIONS: none  CONDITION: stable  Adele Barthel, MD [facility] [place] Office: [phone] Pager: [phone]  02/15/2014, 1:06 PM

## 2014-02-15 NOTE — Care Management Note (Unsigned)
    Page 1 of 1   02/15/2014     3:45:08 PM CARE MANAGEMENT NOTE 02/15/2014  Patient:  Jeffrey Stout, Jeffrey Stout   Account Number:  [id number]  Date Initiated:  02/15/2014  Documentation initiated by:  AMERSON,JULIE  Subjective/Objective Assessment:   Pt adm on 02/14/13 for IVF prior to angiogram on 02/15/14. PTA, pt independent, lives with spouse.     Action/Plan:   Will follow for dc needs as pt progresses.   Anticipated DC Date:  02/16/2014   Anticipated DC Plan:  [place]  CM consult      Choice offered to / List presented to:             Status of service:  In process, will continue to follow Medicare Important Message given?   (If response is "NO", the following Medicare IM given date fields will be blank) Date Medicare IM given:   Date Additional Medicare IM given:    Discharge Disposition:    Per UR Regulation:  Reviewed for med. necessity/level of care/duration of stay  If discussed at [place] of Stay Meetings, dates discussed:    Comments:

## 2014-02-15 NOTE — Progress Notes (Signed)
Utilization review completed.  

## 2014-02-15 NOTE — Interval H&P Note (Signed)
[facility] [place]  History and Physical Update  The patient was interviewed and re-examined.  The patient's previous History and Physical has been reviewed and is unchanged.  There is no change in the plan of care: aortogram with carbon dioxide, possible renal angiogram, possible right iliac intervention.  Adele Barthel, MD [facility] [place] Office: [phone] Pager: [phone]  02/15/2014, 10:41 AM

## 2014-02-16 LAB — HEMOGLOBIN A1C
HEMOGLOBIN A1C: 6 % — AB (ref <5.7)
MEAN PLASMA GLUCOSE: 126 mg/dL — AB (ref <117)

## 2014-02-16 MED ORDER — CLOPIDOGREL BISULFATE 75 MG PO TABS: 75.0000 mg | ORAL_TABLET | Freq: Every day | ORAL | Status: DC

## 2014-02-16 NOTE — Discharge Summary (Signed)
[facility] Discharge Summary  Jeffrey Stout [birth year] 63 y.o. male  [id number]  Admission Date: 02/14/2014  Discharge Date: 02/16/2014  Physician: Adele Barthel, MD  Admission Diagnosis: Poorly controlled hypertension, right leg claudication   HPI:   This is a 63 y.o. male who presented with chief complaint: Right leg numbness. Onset of symptom occurred years ago without obvious trigger. Pt denies pain but notes numbness from right hip down to leg with ambulation. Patient notes this resolves with rest. The patient has no rest pain or classic intermittent claudication symptoms. He has never had ulcers or gangrene.  Additionally, this patient with known history of HTN since 63 y/o has had recently escalation in his BP despite being on 5 agents per the patient. He was recently seen in the ED for poorly controlled BP. The patient denies any change in urinary habits. He denies any sx consistent with a pheochromocytoma. Atherosclerotic risk factors include: HTN, prior smoking history.  Hospital Course:  The patient was admitted to the hospital and taken to the angiography suite on 02/15/2014 - 02/15/2014 and underwent:  1. Bilateral common femoral artery cannulation under ultrasound guidance  2. Placement of catheter in aorta  3. Aortogram  4. Angioplasty and stenting left common iliac artery (iCAST 10 mm x 38 mm)  5. Left renal angiogram  6. Left angioplasty and stenting left renal artery (6 mm x 12 mm)    The pt tolerated the procedure well and was admitted overnight for hydration and blood pressure monitoring due to correction of left renal artery stenosis.   On POD 1, he experienced an improvement in his creatinine. He had no complications with his bilateral groin sheath sties. He was discharged home on POD 1. He was discharged with plavix. The patient was told to monitor his blood pressure twice daily and to call his PCP if it drops below 120/80. He will ill follow up  in the [facility] clinic in 2-3 weeks following outpatient cardiac risk stratification for a fem-fem bypass graft. He will need to reduplex his right renal artery.     CBC    Component Value Date/Time   WBC 8.6 02/15/2014 1914   RBC 4.82 02/15/2014 1914   HGB 15.6 02/15/2014 1914   HCT 44.1 02/15/2014 1914   PLT 133* 02/15/2014 1914   MCV 91.5 02/15/2014 1914   MCH 32.4 02/15/2014 1914   MCHC 35.4 02/15/2014 1914   RDW 12.7 02/15/2014 1914    BMET    Component Value Date/Time   NA 142 02/15/2014 0255   K 3.5* 02/15/2014 0255   CL 104 02/15/2014 0255   CO2 26 02/15/2014 0255   GLUCOSE 145* 02/15/2014 0255   BUN 21 02/15/2014 0255   CREATININE 1.47* 02/15/2014 1914   CALCIUM 8.8 02/15/2014 0255   GFRNONAA 49* 02/15/2014 1914   GFRAA 57* 02/15/2014 1914     Discharge Instructions:   The patient is discharged to home with extensive instructions on wound care and progressive ambulation.  They are instructed not to drive or perform any heavy lifting until returning to see the physician in his office.  Discharge Instructions   Call MD for:  redness, tenderness, or signs of infection (pain, swelling, bleeding, redness, odor or green/yellow discharge around incision site)    Complete by:  As directed      Call MD for:  severe or increased pain, loss or decreased feeling  in affected limb(s)    Complete by:  As directed  Call MD for:  temperature >100.5    Complete by:  As directed      Driving Restrictions    Complete by:  As directed   No driving restrictions.     Increase activity slowly    Complete by:  As directed   Walk with assistance use walker or cane as needed     Lifting restrictions    Complete by:  As directed   No lifting for 2 weeks     No dressing needed    Complete by:  As directed   May apply bandage to groins, but no dressings necessary.     Resume previous diet    Complete by:  As directed            Discharge Diagnosis:  Poorly controlled hypertension, right leg  claudication   Secondary Diagnosis: Patient Active Problem List   Diagnosis Date Noted  . PAD (peripheral artery disease) 02/14/2014  . Unspecified essential hypertension 01/26/2014  . Atherosclerosis of native arteries of the extremities with intermittent claudication 01/26/2014  . Renal artery stenosis 01/26/2014  . Loss of feeling or sensation 05/03/2013   Past Medical History  Diagnosis Date  . Hypertension   . TIA (transient ischemic attack)        Medication List         amLODipine 10 MG tablet  Commonly known as:  NORVASC  Take 10 mg by mouth daily.     aspirin 81 MG tablet  Take 81 mg by mouth daily.     benazepril 40 MG tablet  Commonly known as:  LOTENSIN  Take 40 mg by mouth daily.     carvedilol 6.25 MG tablet  Commonly known as:  COREG  Take 6.25 mg by mouth 2 (two) times daily with a meal.     cloNIDine 0.1 mg/24hr patch  Commonly known as:  CATAPRES - Dosed in mg/24 hr  Place 1 patch onto the skin once a week. Change patch on Thursdays     clopidogrel 75 MG tablet  Commonly known as:  PLAVIX  Take 1 tablet (75 mg total) by mouth daily.     hydrochlorothiazide 25 MG tablet  Commonly known as:  HYDRODIURIL  Take 25 mg by mouth daily.     pravastatin 40 MG tablet  Commonly known as:  PRAVACHOL  Take 40 mg by mouth at bedtime.        Prescriptions: Plavix 75mg  daily #30, 11 RFs  Disposition: Home  Patient's condition: is Good  Follow up: 1. Dr. Bridgett Larsson in 2-3 weeks after outpatient cardiac risk stratification for a fem-fem BPG and right renal artery duplex.    Virgina Jock, PA-C [facility] [phone] 02/16/2014  12:39 PM  Addendum  I have independently interviewed and examined the patient, and I agree with the physician assistant's discharge summary.  This patient was admitted for pre-procedural hydration given significant increase of creatinine two weeks ago when his angiographic procedure was scheduled.  On  02/15/14, he underwent aortogram which revealed near occlusion of L renal artery, CTO R CIA not amendable to endovascular recannulation, and L CIA stenosis.  He underwent L RAS and L CIA stenting.  Of note, I could not visualize a R renal artery despite having one present pre-procedure.  He will need repeat R renal duplex upon follow up.  After preop cardiac risk stratification, I will likely offer the patient a L-to-R fem-fem BPG.  Adele Barthel, MD [facility] [place] Office: [phone] Pager: [phone]  02/16/2014, 8:28 PM

## 2014-02-16 NOTE — Progress Notes (Signed)
Nursing note Patient discharge instructions reviewed with patient and wife medication list and AVS given to patient. Patient prescription sent to personal pharmacy. Will discharge home as ordered. [place], [employer] RNe

## 2014-02-16 NOTE — Progress Notes (Signed)
  [facility] Progress Note  02/16/2014 8:36 AM 1 Day Post-Op  Subjective:  Patient doing well this am. No complaints.   Tmax 97.6 BP sys 140s-180s 02 99% RA  Filed Vitals:   02/16/14 0750  BP: 156/94  Pulse: 73  Temp:   Resp:     Physical Exam: Incisions:  Bilateral groin sheath sites without active bleeding. No hematoma.  Extremities:  Right 1+ DP pulse. Non palpable right PT pulse. Left 2+ DP/PT pulses.   CBC    Component Value Date/Time   WBC 8.6 02/15/2014 1914   RBC 4.82 02/15/2014 1914   HGB 15.6 02/15/2014 1914   HCT 44.1 02/15/2014 1914   PLT 133* 02/15/2014 1914   MCV 91.5 02/15/2014 1914   MCH 32.4 02/15/2014 1914   MCHC 35.4 02/15/2014 1914   RDW 12.7 02/15/2014 1914    BMET    Component Value Date/Time   NA 142 02/15/2014 0255   K 3.5* 02/15/2014 0255   CL 104 02/15/2014 0255   CO2 26 02/15/2014 0255   GLUCOSE 145* 02/15/2014 0255   BUN 21 02/15/2014 0255   CREATININE 1.47* 02/15/2014 1914   CALCIUM 8.8 02/15/2014 0255   GFRNONAA 49* 02/15/2014 1914   GFRAA 57* 02/15/2014 1914    INR    Component Value Date/Time   INR 0.99 02/14/2014 1437     Intake/Output Summary (Last 24 hours) at 02/16/14 0836 Last data filed at 02/16/14 0807  Gross per 24 hour  Intake 1551.67 ml  Output   3350 ml  Net -1798.33 ml     Assessment:  63 y.o. male is s/p:  1. Bilateral common femoral artery cannulation under ultrasound guidance  2. Placement of catheter in aorta  3. Aortogram  4. Angioplasty and stenting left common iliac artery (iCAST 10 mm x 38 mm)  5. Left renal angiogram  6. Left angioplasty and stenting left renal artery (6 mm x 12 mm)  1 Day Post-Op  Plan: -Creatinine trending down. 1.47 today from 1.65 earlier this morning.  -Palpable bilateral pulses  -Will discharge home today. Patient will follow-up as an outpatient with Dr. Bridgett Larsson after cardiac work-up.    Virgina Jock, PA-C [facility] Office:  [phone] Pager: [phone] 02/16/2014 8:36 AM

## 2014-02-16 NOTE — Progress Notes (Signed)
Utilization review completed.  

## 2014-02-16 NOTE — Progress Notes (Signed)
   Daily Progress Note  Assessment/Planning: POD #1 s/p L RAS, L CIA PTA+S   Interval improvement in creatinine hopeful for improvement long term   Will need to reduplex right renal artery in clinic, as I could not see on aortogram  Continue plavix  Discussed with patient need to BID BP measurements as his HTN may resolve if truly renovascular in nature.  If BP starts to drop below 120/80, he will call his PCP for further Rx titration  D/C home  F/U in 2-3 weeks after outpatient cardiac risk stratification for a fem-fem BPG  Subjective  - 1 Day Post-Op  No complains  Objective Filed Vitals:   02/15/14 1815 02/15/14 2031 02/16/14 0402 02/16/14 0750  BP: 148/68 146/79 146/85 156/94  Pulse: 60 74 67 73  Temp:  97.4 F (36.3 C) 97.6 F (36.4 C)   TempSrc:  Oral Oral   Resp:  18 18   Height:      Weight:      SpO2:  97% 97%     Intake/Output Summary (Last 24 hours) at 02/16/14 0841 Last data filed at 02/16/14 0807  Gross per 24 hour  Intake 1551.67 ml  Output   3350 ml  Net -1798.33 ml    PULM  CTAB CV  RRR GI  soft, NTND VASC  No hematoma or PSA in either groin, palpable L pedal pulses, room temp R foot  Laboratory CBC    Component Value Date/Time   WBC 8.6 02/15/2014 1914   HGB 15.6 02/15/2014 1914   HCT 44.1 02/15/2014 1914   PLT 133* 02/15/2014 1914    BMET    Component Value Date/Time   NA 142 02/15/2014 0255   K 3.5* 02/15/2014 0255   CL 104 02/15/2014 0255   CO2 26 02/15/2014 0255   GLUCOSE 145* 02/15/2014 0255   BUN 21 02/15/2014 0255   CREATININE 1.47* 02/15/2014 1914   CALCIUM 8.8 02/15/2014 0255   GFRNONAA 49* 02/15/2014 1914   GFRAA 57* 02/15/2014 1914    Adele Barthel, MD [facility] [place] Office: [phone] Pager: [phone]  02/16/2014, 8:41 AM

## 2014-02-20 ENCOUNTER — Telehealth: Payer: Self-pay | Admitting: Vascular Surgery

## 2014-02-20 NOTE — Telephone Encounter (Signed)
Message copied by Gena Fray on Tue Feb 20, 2014  1:19 PM ------      Message from: Peter Minium K      Created: Thu Feb 15, 2014  4:45 PM      Regarding: Schedule                   ----- Message -----         From: Conrad [place], MD         Sent: 02/15/2014   1:31 PM           To: [facility] [address]            Jeffrey Stout      [id number]      Jan 08, 1951            PROCEDURE:      1.  Bilateral common femoral artery cannulation under ultrasound guidance      2.  Placement of catheter in aorta      3.  Aortogram      4.  Angioplasty and stenting left common iliac artery (iCAST 10 mm x 38 mm)      5.  Left renal angiogram      6.  Left angioplasty and stenting left renal artery (6 mm x 12 mm)            Follow-up: 2-3 weeks            Orders(s) for follow-up:       1.  Preop cardiology evaluation (pt prefers a [place] MD)      2.  Referral to [facility] (pt prefers a [place] MD)             ------

## 2014-02-20 NOTE — Telephone Encounter (Signed)
02/20/14: spoke with Jeffrey Stout to inform him of an appointment with Jeffrey Stout ([place]) on 03/20/2014 @ 8:30am, [address], [address] [place]  A referral has been placed to [facility]- I am waiting to hear back from their office. Jeffrey Stout is aware that I will contact him once I receive an appointment with the kidney MDs

## 2014-02-22 NOTE — Telephone Encounter (Signed)
02/22/14: received a call from Borden at [facility]. She received the faxed clinical notes, labwork and demographics. She stated that she could not schedule until she had received the "New Patient Referral Form" that is specific to their office.   This was faxed back to Rhonda's attention on 02/22/14.  Received another call from [place] on 02/22/14 stating that the MD had reviewed the information and was not sure why we were referring to Nephrology. I stated that his creatinine had been steadily rising. She stated "it is not that elevated" and "we need more to go on" to schedule the patient.  This is all the information that I have documented. I will have Dr Bridgett Larsson call MD at [facility] to expedite the referral.  DPM

## 2014-02-26 ENCOUNTER — Telehealth: Payer: Self-pay | Admitting: Vascular Surgery

## 2014-02-26 NOTE — Telephone Encounter (Signed)
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 I included last 2 months worth of lab work to show elevated creatinine.             I received a call today from [place] with referrals stating that the MD reviewing the information did not see the reason for the referral to nephrology. (refer to my telephone encounter dated 02/20/14)  I have sent all our notes and have nothing else to send.             Would you like to call [facility] regarding the clinical reason for referral? If so, their phone # is [phone].            Thanks,      Hinton Dyer       ------

## 2014-02-26 NOTE — Telephone Encounter (Signed)
Faxed additional diagnosis from below along with referral sheet to [facility], dpm

## 2014-03-05 ENCOUNTER — Telehealth: Payer: Self-pay | Admitting: *Deleted

## 2014-03-05 NOTE — Telephone Encounter (Signed)
Patient called inquiring if he could return to work.  Upon discharge he was instructed to wait 2 weeks and no lifting anything heavier than 10 lbs. Patient is returning to work 03/06/14 but will notify us of any complications.

## 2014-03-20 ENCOUNTER — Ambulatory Visit (INDEPENDENT_AMBULATORY_CARE_PROVIDER_SITE_OTHER): Payer: BC Managed Care – PPO | Admitting: Cardiovascular Disease

## 2014-03-20 ENCOUNTER — Encounter: Payer: Self-pay | Admitting: Cardiovascular Disease

## 2014-03-20 VITALS — BP 124/86 | HR 58 | Ht 74.0 in | Wt 175.8 lb

## 2014-03-20 DIAGNOSIS — E782 Mixed hyperlipidemia: Secondary | ICD-10-CM

## 2014-03-20 DIAGNOSIS — I70219 Atherosclerosis of native arteries of extremities with intermittent claudication, unspecified extremity: Secondary | ICD-10-CM

## 2014-03-20 DIAGNOSIS — I739 Peripheral vascular disease, unspecified: Secondary | ICD-10-CM

## 2014-03-20 DIAGNOSIS — I10 Essential (primary) hypertension: Secondary | ICD-10-CM

## 2014-03-20 DIAGNOSIS — E785 Hyperlipidemia, unspecified: Secondary | ICD-10-CM

## 2014-03-20 DIAGNOSIS — Z79899 Other long term (current) drug therapy: Secondary | ICD-10-CM

## 2014-03-20 DIAGNOSIS — Z01818 Encounter for other preprocedural examination: Secondary | ICD-10-CM

## 2014-03-20 LAB — HEPATIC FUNCTION PANEL
ALT: 36 U/L (ref 0–53)
AST: 25 U/L (ref 0–37)
Albumin: 4.7 g/dL (ref 3.5–5.2)
Alkaline Phosphatase: 70 U/L (ref 39–117)
BILIRUBIN DIRECT: 0.1 mg/dL (ref 0.0–0.3)
BILIRUBIN INDIRECT: 0.5 mg/dL (ref 0.2–1.2)
Total Bilirubin: 0.6 mg/dL (ref 0.2–1.2)
Total Protein: 7.6 g/dL (ref 6.0–8.3)

## 2014-03-20 LAB — LIPID PANEL
Cholesterol: 119 mg/dL (ref 0–200)
HDL: 34 mg/dL — ABNORMAL LOW (ref >39)
LDL CALC: 52 mg/dL (ref 0–99)
TRIGLYCERIDES: 163 mg/dL — AB (ref <150)
Total CHOL/HDL Ratio: 3.5 Ratio
VLDL: 33 mg/dL (ref 0–40)

## 2014-03-20 NOTE — Progress Notes (Signed)
03/20/2014 Jeffrey Stout   06-Feb-1951  [id number]  Primary Physician No primary provider on file. Primary Cardiologist: Lorretta Harp MD Renae Gloss   HPI:  Jeffrey Stout is a very pleasant 63 year old thin appearing married Caucasian male father of 3 children, grandfather 5 grandchildren who works at a [employer]. He was referred by Dr. Bridgett Larsson for cardiovascular clearance before elective lower extremity surgical revascularization. His carotids are 2+ positive to 75 pack years of tobacco abuse having quit one year ago. History of hypertension, hyperlipidemia. His mother also had a history of heart disease. He has never had a heart attack or stroke, denies chest pain or shortness of breath.   Current Outpatient Prescriptions  Medication Sig Dispense Refill  . amLODipine (NORVASC) 10 MG tablet Take 10 mg by mouth daily.      Marland Kitchen aspirin 81 MG tablet Take 81 mg by mouth daily.      . benazepril (LOTENSIN) 40 MG tablet Take 40 mg by mouth. TAKES 1/2 TABLET      . clopidogrel (PLAVIX) 75 MG tablet Take 1 tablet (75 mg total) by mouth daily.  30 tablet  11  . hydrochlorothiazide (HYDRODIURIL) 25 MG tablet Take 25 mg by mouth daily.      . pravastatin (PRAVACHOL) 40 MG tablet Take 40 mg by mouth at bedtime.        No current facility-administered medications for this visit.    No Known Allergies  History   Social History  . Marital Status: Married    Spouse Name: N/A    Number of Children: N/A  . Years of Education: N/A   Occupational History  . Not on file.   Social History Main Topics  . Smoking status: Former Smoker    Quit date: 04/24/2013  . Smokeless tobacco: Never Used  . Alcohol Use: No  . Drug Use: No  . Sexual Activity: Not on file   Other Topics Concern  . Not on file   Social History Narrative  . No narrative on file     Review of Systems: General: negative for chills, fever, night sweats or weight changes.  Cardiovascular: negative for chest  pain, dyspnea on exertion, edema, orthopnea, palpitations, paroxysmal nocturnal dyspnea or shortness of breath Dermatological: negative for rash Respiratory: negative for cough or wheezing Urologic: negative for hematuria Abdominal: negative for nausea, vomiting, diarrhea, bright red blood per rectum, melena, or hematemesis Neurologic: negative for visual changes, syncope, or dizziness All other systems reviewed and are otherwise negative except as noted above.    Blood pressure 124/86, pulse 58, height 6\' 2"  (1.88 m), weight 175 lb 12.8 oz (79.742 kg).  General appearance: alert and no distress Neck: no adenopathy, no carotid bruit, no JVD, supple, symmetrical, trachea midline and thyroid not enlarged, symmetric, no tenderness/mass/nodules Lungs: clear to auscultation bilaterally Heart: regular rate and rhythm, S1, S2 normal, no murmur, click, rub or gallop Extremities: extremities normal, atraumatic, no cyanosis or edema and absent pedal pulses  EKG sinus bradycardia at 58 without ST or T wave changes  ASSESSMENT AND PLAN:   PAD (peripheral artery disease) Patient was referred by Dr. Bridgett Larsson for preoperative clearance before open surgical revascularization of his lower extremities. He recently had a left common iliac artery covered stent placed as well as left renal artery intervention. Factors include family history, hypertension and hyperlipidemia. He denies chest pain or shortness of breath. I'm going to get a pharmacologic Myoview stress test because of his inability  to exercise risk stratify him prior to his upcoming open surgical revascularization procedure.  Unspecified essential hypertension Controlled on current medications  Hyperlipidemia On statin therapy. We will check a lipid and liver profile      Lorretta Harp MD [facility], [facility] 03/20/2014 10:23 AM

## 2014-03-20 NOTE — Assessment & Plan Note (Signed)
Controlled on current medications 

## 2014-03-20 NOTE — Patient Instructions (Addendum)
Your physician has requested that you have a lexiscan myoview. For further information please visit [email or web address]. Please follow instruction sheet, as given.  Dr Gwenlyn Found has ordered to have blood work done at your earliest convienence. (Lipid, Liver)  Your physician recommends that you schedule a follow-up appointment as needed to see Dr. Gwenlyn Found.

## 2014-03-20 NOTE — Assessment & Plan Note (Signed)
Patient was referred by Dr. Bridgett Larsson for preoperative clearance before open surgical revascularization of his lower extremities. He recently had a left common iliac artery covered stent placed as well as left renal artery intervention. Factors include family history, hypertension and hyperlipidemia. He denies chest pain or shortness of breath. I'm going to get a pharmacologic Myoview stress test because of his inability to exercise risk stratify him prior to his upcoming open surgical revascularization procedure.

## 2014-03-20 NOTE — Assessment & Plan Note (Signed)
On statin therapy. We will check a lipid and liver profile

## 2014-03-22 ENCOUNTER — Encounter: Payer: Self-pay | Admitting: Vascular Surgery

## 2014-03-23 ENCOUNTER — Encounter: Payer: Self-pay | Admitting: *Deleted

## 2014-03-23 ENCOUNTER — Encounter: Payer: Self-pay | Admitting: Vascular Surgery

## 2014-03-23 ENCOUNTER — Ambulatory Visit (INDEPENDENT_AMBULATORY_CARE_PROVIDER_SITE_OTHER): Payer: BC Managed Care – PPO | Admitting: Vascular Surgery

## 2014-03-23 VITALS — BP 129/80 | HR 85 | Ht 74.0 in | Wt 175.4 lb

## 2014-03-23 DIAGNOSIS — I70219 Atherosclerosis of native arteries of extremities with intermittent claudication, unspecified extremity: Secondary | ICD-10-CM

## 2014-03-23 DIAGNOSIS — I701 Atherosclerosis of renal artery: Secondary | ICD-10-CM

## 2014-03-23 DIAGNOSIS — I150 Renovascular hypertension: Secondary | ICD-10-CM

## 2014-03-23 NOTE — Progress Notes (Signed)
Postoperative Visit   History of Present Illness  Jeffrey Stout is a 63 y.o. male who presents for postoperative follow-up from procedure on Date: 02/16/15: L CIA PTA+S, L renal artery PTA+S.  The angiogram demonstrated calcified CTO R CIA.  His right leg sx remain as previously: numbness in R leg not consistent with his PAD.  He denies rest pain or classic intermittent claudication sx.  Pt is scheduled for a nuclear stress test as part of his preop evaluation for possible femorofemoral bypass.  In regards to his HTN: he has come down from 5 medications to 2 medications.  He denies any change in bowel and bladder habits.    Past Medical History  Diagnosis Date  . Hypertension   . TIA (transient ischemic attack)   . Peripheral arterial disease   . Hyperlipidemia     Past Surgical History  Procedure Laterality Date  . Cholecystectomy      History   Social History  . Marital Status: Married    Spouse Name: N/A    Number of Children: N/A  . Years of Education: N/A   Occupational History  . Not on file.   Social History Main Topics  . Smoking status: Former Smoker    Quit date: 04/24/2013  . Smokeless tobacco: Never Used  . Alcohol Use: No  . Drug Use: No  . Sexual Activity: Not on file   Other Topics Concern  . Not on file   Social History Narrative  . No narrative on file    Family History  Problem Relation Age of Onset  . Diabetes Mother   . Hypertension Brother   . Heart attack Brother      Current Outpatient Prescriptions on File Prior to Visit  Medication Sig Dispense Refill  . amLODipine (NORVASC) 10 MG tablet Take 10 mg by mouth daily.      Marland Kitchen aspirin 81 MG tablet Take 81 mg by mouth daily.      . benazepril (LOTENSIN) 40 MG tablet Take 40 mg by mouth. TAKES 1/2 TABLET      . clopidogrel (PLAVIX) 75 MG tablet Take 1 tablet (75 mg total) by mouth daily.  30 tablet  11  . hydrochlorothiazide (HYDRODIURIL) 25 MG tablet Take 25 mg by mouth daily.        . pravastatin (PRAVACHOL) 40 MG tablet Take 40 mg by mouth at bedtime.        No current facility-administered medications on file prior to visit.    No Known Allergies  REVIEW OF SYSTEMS:  (Positives checked otherwise negative)  CARDIOVASCULAR:  []  chest pain, []  chest pressure, []  palpitations, []  shortness of breath when laying flat, []  shortness of breath with exertion,  []  pain in feet when walking, []  pain in feet when laying flat, []  history of blood clot in veins (DVT), []  history of phlebitis, []  swelling in legs, []  varicose veins  PULMONARY:  []  productive cough, []  asthma, []  wheezing  NEUROLOGIC:  []  weakness in arms or legs, [x]  numbness in arms or legs, []  difficulty speaking or slurred speech, []  temporary loss of vision in one eye, []  dizziness  HEMATOLOGIC:  []  bleeding problems, []  problems with blood clotting too easily  MUSCULOSKEL:  []  joint pain, []  joint swelling  GASTROINTEST:  []  vomiting blood, []  blood in stool     GENITOURINARY:  []  burning with urination, []  blood in urine  PSYCHIATRIC:  []  history of major depression  INTEGUMENTARY:  []   rashes, []  ulcers     For VQI Use Only  PRE-ADM LIVING: Home  AMB STATUS: Ambulatory  Physical Examination  Filed Vitals:   03/23/14 1331  BP: 129/80  Pulse: 85  Height: 6\' 2"  (1.88 m)  Weight: 175 lb 6.4 oz (79.561 kg)  SpO2: 98%   Body mass index is 22.51 kg/(m^2).  General: A&O x 3, WDWN  Pulmonary: Sym exp, good air movt, CTAB, no rales, rhonchi, & wheezing  Cardiac: RRR, Nl S1, S2, no Murmurs, rubs or gallops  Vascular: Vessel Right Left  Radial Palpable Palpable  Ulnar Palpable Palpable  Brachial Palpable Palpable  Carotid Palpable, without bruit Palpable, without bruit  Aorta Not palpable N/A  Femoral Palpable Palpable  Popliteal Not palpable Not palpable  PT Not Palpable Palpable  DP Not Palpable Palpable    Gastrointestinal: soft, NTND, -G/R, - HSM, - masses, - CVAT  B  Musculoskeletal: M/S 5/5 throughout , Extremities without ischemic changes , Bil. groin without hematoma, no echymosis present at either cannulation sites  Neurologic:  Pain and light touch intact in extremities , Motor exam as listed above  Medical Decision Making  Jeffrey Stout is a 63 y.o. male who presents s/p L RAS for renovascular HTN, L CIA PTA+S, R CIA CTO.  Surprisingly this patient had a significant result to stenting the L renal artery leading to significant reduction in his BP regimen.  This strongly suggest renovascular HTN in this patient.  He will need q3 month renal duplex to watch this L RAS.  The pt's anatomy was not compatible with commercial stent so there is protrusion of the anterior edge of the stent into the flow lumen of the aorta/renal artery border Based on his angiographic findings, this patient needs: L to R femorofemoral bypass. The risk, benefits, and alternative for bypass operations were discussed with the patient.  The patient is aware the risks include but are not limited to: bleeding, infection, myocardial infarction, stroke, limb loss, nerve damage, need for additional procedures in the future, wound complications, and inability to complete the bypass.   The patient is aware of these risks and agreed to proceed.  He will be scheduled for 19 AUG 15 assuming his cardiac risk stratification and optimization does not delay his case. I discussed in depth with the patient the nature of atherosclerosis, and emphasized the importance of maximal medical management including strict control of blood pressure, blood glucose, and lipid levels, obtaining regular exercise, and cessation of smoking.  The patient is aware that without maximal medical management the underlying atherosclerotic disease process will progress, limiting the benefit of any interventions. The patient is currently on a statin: Pravachol.    The patient is currently on an anti-platelet: Plavix.     Thank you for allowing [place] to participate in this patient's care.  Adele Barthel, MD [facility] [place] Office: [phone] Pager: [phone]

## 2014-03-28 ENCOUNTER — Telehealth (HOSPITAL_COMMUNITY): Payer: Self-pay

## 2014-03-28 NOTE — Telephone Encounter (Signed)
Encounter complete. 

## 2014-03-30 ENCOUNTER — Other Ambulatory Visit: Payer: Self-pay

## 2014-03-30 ENCOUNTER — Ambulatory Visit (HOSPITAL_COMMUNITY)
Admission: RE | Admit: 2014-03-30 | Discharge: 2014-03-30 | Disposition: A | Discharge status: Home / Self Care | Payer: BC Managed Care – PPO | Source: Ambulatory Visit | Attending: Cardiology | Admitting: Cardiology

## 2014-03-30 DIAGNOSIS — I739 Peripheral vascular disease, unspecified: Secondary | ICD-10-CM

## 2014-03-30 DIAGNOSIS — Z01810 Encounter for preprocedural cardiovascular examination: Secondary | ICD-10-CM

## 2014-03-30 DIAGNOSIS — Z01818 Encounter for other preprocedural examination: Secondary | ICD-10-CM | POA: Insufficient documentation

## 2014-03-30 MED ORDER — REGADENOSON 0.4 MG/5ML IV SOLN
0.4000 mg | Freq: Once | INTRAVENOUS | Status: AC
  Administered 2014-03-30: 0.4 mg via INTRAVENOUS

## 2014-03-30 MED ORDER — TECHNETIUM TC 99M SESTAMIBI GENERIC - CARDIOLITE
30.0000 | Freq: Once | INTRAVENOUS | Status: AC | PRN
  Administered 2014-03-30: 30 via INTRAVENOUS

## 2014-03-30 MED ORDER — TECHNETIUM TC 99M SESTAMIBI GENERIC - CARDIOLITE
10.0000 | Freq: Once | INTRAVENOUS | Status: AC | PRN
  Administered 2014-03-30: 10 via INTRAVENOUS

## 2014-03-30 NOTE — Procedures (Addendum)
[facility] [address] [place] [address] [phone]  Cardiology Nuclear Med Study  Burt Piatek is a 63 y.o. male     MRN : [id number]     DOB: 1950-09-17  Procedure Date: 03/30/2014  Nuclear Med Background Indication for Stress Test:  Surgical Clearance and [facility] History:  stent;No priro respiratory history reported;No prior NUC MPI for comparison. Cardiac Risk Factors: Carotid Disease, Claudication, Family History - CAD, History of Smoking, Hypertension, Lipids, PVD, TIA and Renal artery stenosis.  Symptoms:  Dizziness, Light-Headedness and Palpitations   Nuclear Pre-Procedure Caffeine/Decaff Intake:  9:00pm NPO After: 7:00am   IV Site: R Hand  IV 0.9% NS with Angio Cath:  22g  Chest Size (in):  42"  IV Started by: Rolene Course, RN  Height: 6\' 2"  (1.88 m)  Cup Size: n/a  BMI:  Body mass index is 22.46 kg/(m^2). Weight:  175 lb (79.379 kg)   Tech Comments:  n/a    Nuclear Med Study 1 or 2 day study: 1 day  Stress Test Type:  [place] Provider:  Quay Burow, MD   Resting Radionuclide: Technetium 71m Sestamibi  Resting Radionuclide Dose: 10.4 mCi   Stress Radionuclide:  Technetium 25m Sestamibi  Stress Radionuclide Dose: 31.9 mCi           Stress Protocol Rest HR:57 Stress HR: 61  Rest BP: 132/78 Stress BP: 139/84  Exercise Time (min): n/a METS: n/a   Predicted Max HR: 157 bpm % Max HR: 56.05 bpm Rate Pressure Product: 12584  Dose of Adenosine (mg):  n/a Dose of Lexiscan: 0.4 mg  Dose of Atropine (mg): n/a Dose of Dobutamine: n/a mcg/kg/min (at max HR)  Stress Test Technologist: Leane Para, CCT Nuclear Technologist: Otho Perl, CNMT   Rest Procedure:  Myocardial perfusion imaging was performed at rest 45 minutes following the intravenous administration of Technetium 51m Sestamibi. Stress Procedure:  The patient received IV Lexiscan 0.4 mg over 15-seconds.   Technetium 16m Sestamibi injected IV at 30-seconds.  There were no significant changes with Lexiscan.  Quantitative spect images were obtained after a 45 minute delay.  Transient Ischemic Dilatation (Normal <1.22):  1.22 QGS EDV:  136 ml QGS ESV:  80 ml LV Ejection Fraction: 41%        Rest ECG: NSR - Normal EKG  Stress ECG: No significant change from baseline ECG  QPS Raw Data Images:  Normal; no motion artifact; normal heart/lung ratio. Stress Images:  There is decreased uptake in the inferior wall. Rest Images:  There is decreased uptake in the inferior wall. Subtraction (SDS):  There is a fixed inferior defect that is most consistent with diaphragmatic attenuation.  Impression Exercise Capacity:  Lexiscan with no exercise. BP Response:  Normal blood pressure response. Clinical Symptoms:  No significant symptoms noted. ECG Impression:  No significant ST segment change suggestive of ischemia. Comparison with Prior Nuclear Study: No images to compare  Overall Impression:  Diapgragmatic attenuation with apical thinning  LV Wall Motion:  Global hypokinesia. EF 41%. Suggest 2D echo to further characterize   Lorretta Harp, MD  04/02/2014 7:19 AM

## 2014-04-03 ENCOUNTER — Telehealth: Payer: Self-pay | Admitting: *Deleted

## 2014-04-03 ENCOUNTER — Telehealth: Payer: Self-pay | Admitting: Cardiovascular Disease

## 2014-04-03 ENCOUNTER — Encounter (HOSPITAL_COMMUNITY): Payer: Self-pay | Admitting: Pharmacy Technician

## 2014-04-03 DIAGNOSIS — I519 Heart disease, unspecified: Secondary | ICD-10-CM

## 2014-04-03 NOTE — Telephone Encounter (Signed)
Zigmund Daniel is calling to see whether Mr. Renteria has been cleared for surgery on the 19th of this month .Marland Kitchen Please Call  Thanks

## 2014-04-03 NOTE — Telephone Encounter (Signed)
Patient agreeable to proceed with echo.  Test ordered and sent to [facility] for scheduling.

## 2014-04-03 NOTE — Telephone Encounter (Signed)
L-> R fem fem scheduled for 19th

## 2014-04-03 NOTE — Telephone Encounter (Signed)
We will await echo results

## 2014-04-03 NOTE — Telephone Encounter (Signed)
Message forwarded to Dr. Cecil Cobbs, RN to advise on clearance

## 2014-04-03 NOTE — Telephone Encounter (Signed)
Message copied by Chauncy Lean on Tue Apr 03, 2014 12:23 PM ------      Message from: Lorretta Harp      Created: Mon Apr 02, 2014  8:27 PM       Low risk myoview. Check 2D echo to confirm LV fxn ------

## 2014-04-04 ENCOUNTER — Ambulatory Visit (HOSPITAL_COMMUNITY)
Admission: RE | Admit: 2014-04-04 | Discharge: 2014-04-04 | Disposition: A | Discharge status: Home / Self Care | Payer: BC Managed Care – PPO | Source: Ambulatory Visit | Attending: Cardiovascular Disease | Admitting: Cardiovascular Disease

## 2014-04-04 DIAGNOSIS — I519 Heart disease, unspecified: Secondary | ICD-10-CM | POA: Insufficient documentation

## 2014-04-04 DIAGNOSIS — Z01810 Encounter for preprocedural cardiovascular examination: Secondary | ICD-10-CM

## 2014-04-04 DIAGNOSIS — Z01818 Encounter for other preprocedural examination: Secondary | ICD-10-CM | POA: Diagnosis present

## 2014-04-04 DIAGNOSIS — I517 Cardiomegaly: Secondary | ICD-10-CM

## 2014-04-04 DIAGNOSIS — I5189 Other ill-defined heart diseases: Secondary | ICD-10-CM

## 2014-04-04 HISTORY — DX: Cardiomegaly: I51.7

## 2014-04-04 HISTORY — DX: Other ill-defined heart diseases: I51.89

## 2014-04-04 NOTE — Progress Notes (Signed)
2D Echo Performed 04/04/2014    Jeffrey Stout, RCS

## 2014-04-05 ENCOUNTER — Other Ambulatory Visit: Payer: Self-pay

## 2014-04-05 ENCOUNTER — Encounter (HOSPITAL_COMMUNITY)
Admission: RE | Admit: 2014-04-05 | Discharge: 2014-04-05 | Disposition: A | Discharge status: Home / Self Care | Payer: BC Managed Care – PPO | Source: Ambulatory Visit | Attending: Vascular Surgery | Admitting: Vascular Surgery

## 2014-04-05 ENCOUNTER — Encounter (HOSPITAL_COMMUNITY): Payer: Self-pay

## 2014-04-05 DIAGNOSIS — Z01812 Encounter for preprocedural laboratory examination: Secondary | ICD-10-CM | POA: Insufficient documentation

## 2014-04-05 DIAGNOSIS — Z01818 Encounter for other preprocedural examination: Secondary | ICD-10-CM | POA: Insufficient documentation

## 2014-04-05 HISTORY — DX: Gastro-esophageal reflux disease without esophagitis: K21.9

## 2014-04-05 HISTORY — DX: Personal history of other diseases of the circulatory system: Z86.79

## 2014-04-05 LAB — URINALYSIS, ROUTINE W REFLEX MICROSCOPIC
BILIRUBIN URINE: NEGATIVE
GLUCOSE, UA: NEGATIVE mg/dL
Hgb urine dipstick: NEGATIVE
KETONES UR: NEGATIVE mg/dL
LEUKOCYTES UA: NEGATIVE
Nitrite: NEGATIVE
PH: 6.5 (ref 5.0–8.0)
Protein, ur: NEGATIVE mg/dL
SPECIFIC GRAVITY, URINE: 1.009 (ref 1.005–1.030)
Urobilinogen, UA: 0.2 mg/dL (ref 0.0–1.0)

## 2014-04-05 LAB — CBC
HEMATOCRIT: 50.2 % (ref 39.0–52.0)
Hemoglobin: 17.8 g/dL — ABNORMAL HIGH (ref 13.0–17.0)
MCH: 33.8 pg (ref 26.0–34.0)
MCHC: 35.5 g/dL (ref 30.0–36.0)
MCV: 95.4 fL (ref 78.0–100.0)
PLATELETS: 185 10*3/uL (ref 150–400)
RBC: 5.26 MIL/uL (ref 4.22–5.81)
RDW: 13.2 % (ref 11.5–15.5)
WBC: 9.5 10*3/uL (ref 4.0–10.5)

## 2014-04-05 LAB — COMPREHENSIVE METABOLIC PANEL
ALT: 41 U/L (ref 0–53)
ANION GAP: 15 (ref 5–15)
AST: 25 U/L (ref 0–37)
Albumin: 4.3 g/dL (ref 3.5–5.2)
Alkaline Phosphatase: 79 U/L (ref 39–117)
BUN: 17 mg/dL (ref 6–23)
CALCIUM: 9.6 mg/dL (ref 8.4–10.5)
CO2: 26 mEq/L (ref 19–32)
Chloride: 95 mEq/L — ABNORMAL LOW (ref 96–112)
Creatinine, Ser: 1.4 mg/dL — ABNORMAL HIGH (ref 0.50–1.35)
GFR calc non Af Amer: 52 mL/min — ABNORMAL LOW (ref >90)
GFR, EST AFRICAN AMERICAN: 60 mL/min — AB (ref >90)
Glucose, Bld: 91 mg/dL (ref 70–99)
Potassium: 3.8 mEq/L (ref 3.7–5.3)
Sodium: 136 mEq/L — ABNORMAL LOW (ref 137–147)
Total Bilirubin: 0.5 mg/dL (ref 0.3–1.2)
Total Protein: 7.9 g/dL (ref 6.0–8.3)

## 2014-04-05 LAB — PROTIME-INR
INR: 1.02 (ref 0.00–1.49)
Prothrombin Time: 13.4 seconds (ref 11.6–15.2)

## 2014-04-05 LAB — TYPE AND SCREEN
ABO/RH(D): O POS
Antibody Screen: NEGATIVE

## 2014-04-05 LAB — SURGICAL PCR SCREEN
MRSA, PCR: NEGATIVE
Staphylococcus aureus: NEGATIVE

## 2014-04-05 LAB — APTT: aPTT: 30 seconds (ref 24–37)

## 2014-04-05 LAB — ABO/RH: ABO/RH(D): O POS

## 2014-04-05 NOTE — Pre-Procedure Instructions (Signed)
Traquan Duarte  04/05/2014   Your procedure is scheduled on:  Wednesday, August 19th  Report to [facility] at 0630 AM.  Call this number if you have problems the morning of surgery: [phone]   Remember:   Do not eat food or drink liquids after midnight.   Take these medicines the morning of surgery with A SIP OF WATER: norvasc   Do not wear jewelry.  Do not wear lotions, powders, or perfumes. You may wear deodorant.  Do not shave 48 hours prior to surgery. Men may shave face and neck.  Do not bring valuables to the hospital.  [facility] is not responsible  for any belongings or valuables.               Contacts, dentures or bridgework may not be worn into surgery.  Leave suitcase in the car. After surgery it may be brought to your room.  For patients admitted to the hospital, discharge time is determined by your   treatment team.               Patients discharged the day of surgery will not be allowed to drive home.  Please read over the following fact sheets that you were given: Pain Booklet, Coughing and Deep Breathing, Blood Transfusion Information, MRSA Information and Surgical Site Infection Prevention [place] - Preparing for Surgery  Before surgery, you can play an important role.  Because skin is not sterile, your skin needs to be as free of germs as possible.  You can reduce the number of germs on you skin by washing with CHG (chlorahexidine gluconate) soap before surgery.  CHG is an antiseptic cleaner which kills germs and bonds with the skin to continue killing germs even after washing.  Please DO NOT use if you have an allergy to CHG or antibacterial soaps.  If your skin becomes reddened/irritated stop using the CHG and inform your nurse when you arrive at Short Stay.  Do not shave (including legs and underarms) for at least 48 hours prior to the first CHG shower.  You may shave your face.  Please follow these instructions carefully:   1.   Shower with CHG Soap the night before surgery and the morning of Surgery.  2.  If you choose to wash your hair, wash your hair first as usual with your normal shampoo.  3.  After you shampoo, rinse your hair and body thoroughly to remove the shampoo.  4.  Use CHG as you would any other liquid soap.  You can apply CHG directly to the skin and wash gently with scrungie or a clean washcloth.  5.  Apply the CHG Soap to your body ONLY FROM THE NECK DOWN.  Do not use on open wounds or open sores.  Avoid contact with your eyes, ears, mouth and genitals (private parts).  Wash genitals (private parts) with your normal soap.  6.  Wash thoroughly, paying special attention to the area where your surgery will be performed.  7.  Thoroughly rinse your body with warm water from the neck down.  8.  DO NOT shower/wash with your normal soap after using and rinsing off the CHG Soap.  9.  Pat yourself dry with a clean towel.            10.  Wear clean pajamas.            11.  Place clean sheets on your bed the night of your first  shower and do not sleep with pets.  Day of Surgery  Do not apply any lotions/deoderants the morning of surgery.  Please wear clean clothes to the hospital/surgery center.

## 2014-04-09 NOTE — Telephone Encounter (Signed)
Cleared for PV surgery with Dr. Bridgett Larsson at low risk  JJB

## 2014-04-10 MED ORDER — DEXTROSE 5 % IV SOLN
1.5000 g | INTRAVENOUS | Status: AC
  Administered 2014-04-11: 1.5 g via INTRAVENOUS
  Filled 2014-04-10: qty 1.5

## 2014-04-10 NOTE — Telephone Encounter (Signed)
Left message for Jeffrey Stout at Dr Lianne Moris office that Mr Kinker was cleared for surgery.

## 2014-04-10 NOTE — Telephone Encounter (Signed)
Patient notified of results.

## 2014-04-10 NOTE — Anesthesia Preprocedure Evaluation (Addendum)
Anesthesia Evaluation  Patient identified by MRN, date of birth, ID band Patient awake    Reviewed: Allergy & Precautions, H&P , NPO status , Patient's Chart, lab work & pertinent test results  History of Anesthesia Complications Negative for: history of anesthetic complications  Airway Mallampati: II TM Distance: >3 FB Neck ROM: Full    Dental no notable dental hx. (+) Edentulous Upper, Edentulous Lower, Dental Advisory Given   Pulmonary Current Smoker, former smoker,  breath sounds clear to auscultation  Pulmonary exam normal       Cardiovascular hypertension, Pt. on medications + Peripheral Vascular Disease Rhythm:Regular Rate:Normal  Echo 04/07/2014 - Left ventricle: Mid and basal inferior wall hypokinesis. The  cavity size was mildly dilated. Wall thickness was increased in a  pattern of mild LVH. Systolic function was normal. The estimated  ejection fraction was in the range of 50% to 55%. Doppler  parameters are consistent with abnormal left ventricular  relaxation (grade 1 diastolic dysfunction). - Aortic valve: There was trivial regurgitation. - Atrial septum: No defect or patent foramen ovale was identified.   Neuro/Psych TIAnegative psych ROS   GI/Hepatic Neg liver ROS, GERD-  ,  Endo/Other  negative endocrine ROS  Renal/GU Renal disease     Musculoskeletal negative musculoskeletal ROS (+)   Abdominal   Peds  Hematology negative hematology ROS (+)   Anesthesia Other Findings   Reproductive/Obstetrics                        Anesthesia Physical Anesthesia Plan  ASA: II  Anesthesia Plan: General   Post-op Pain Management:    Induction: Intravenous  Airway Management Planned: Oral ETT  Additional Equipment: Arterial line  Intra-op Plan:   Post-operative Plan: Extubation in OR  Informed Consent: I have reviewed the patients History and Physical, chart, labs and discussed the  procedure including the risks, benefits and alternatives for the proposed anesthesia with the patient or authorized representative who has indicated his/her understanding and acceptance.   Dental advisory given and Dental Advisory Given  Plan Discussed with: CRNA and Anesthesiologist  Anesthesia Plan Comments:       Anesthesia Quick Evaluation

## 2014-04-11 ENCOUNTER — Encounter (HOSPITAL_COMMUNITY): Payer: Self-pay | Admitting: *Deleted

## 2014-04-11 ENCOUNTER — Inpatient Hospital Stay (HOSPITAL_COMMUNITY): Payer: BC Managed Care – PPO | Admitting: Anesthesiology

## 2014-04-11 ENCOUNTER — Encounter (HOSPITAL_COMMUNITY)
Admission: RE | Disposition: A | Discharge status: Home / Self Care | Payer: Self-pay | Source: Ambulatory Visit | Attending: Vascular Surgery

## 2014-04-11 ENCOUNTER — Inpatient Hospital Stay (HOSPITAL_COMMUNITY)
Admission: RE | Admit: 2014-04-11 | Discharge: 2014-04-13 | DRG: 254 | Disposition: A | Discharge status: Home / Self Care | Payer: BC Managed Care – PPO | Source: Ambulatory Visit | Attending: Vascular Surgery | Admitting: Vascular Surgery

## 2014-04-11 ENCOUNTER — Encounter (HOSPITAL_COMMUNITY): Payer: BC Managed Care – PPO | Admitting: Vascular Surgery

## 2014-04-11 DIAGNOSIS — I70209 Unspecified atherosclerosis of native arteries of extremities, unspecified extremity: Principal | ICD-10-CM | POA: Diagnosis present

## 2014-04-11 DIAGNOSIS — I150 Renovascular hypertension: Secondary | ICD-10-CM | POA: Diagnosis present

## 2014-04-11 DIAGNOSIS — Z8673 Personal history of transient ischemic attack (TIA), and cerebral infarction without residual deficits: Secondary | ICD-10-CM | POA: Diagnosis not present

## 2014-04-11 DIAGNOSIS — Z87891 Personal history of nicotine dependence: Secondary | ICD-10-CM | POA: Diagnosis not present

## 2014-04-11 DIAGNOSIS — Z7902 Long term (current) use of antithrombotics/antiplatelets: Secondary | ICD-10-CM | POA: Diagnosis not present

## 2014-04-11 DIAGNOSIS — I739 Peripheral vascular disease, unspecified: Secondary | ICD-10-CM

## 2014-04-11 DIAGNOSIS — I745 Embolism and thrombosis of iliac artery: Secondary | ICD-10-CM | POA: Diagnosis present

## 2014-04-11 DIAGNOSIS — Z7982 Long term (current) use of aspirin: Secondary | ICD-10-CM | POA: Diagnosis not present

## 2014-04-11 DIAGNOSIS — Z79899 Other long term (current) drug therapy: Secondary | ICD-10-CM

## 2014-04-11 DIAGNOSIS — I708 Atherosclerosis of other arteries: Secondary | ICD-10-CM | POA: Diagnosis present

## 2014-04-11 DIAGNOSIS — E785 Hyperlipidemia, unspecified: Secondary | ICD-10-CM | POA: Diagnosis present

## 2014-04-11 DIAGNOSIS — I743 Embolism and thrombosis of arteries of the lower extremities: Secondary | ICD-10-CM

## 2014-04-11 HISTORY — PX: FEMORAL-FEMORAL BYPASS GRAFT: SHX936

## 2014-04-11 LAB — CREATININE, SERUM
CREATININE: 1.4 mg/dL — AB (ref 0.50–1.35)
GFR, EST AFRICAN AMERICAN: 60 mL/min — AB (ref >90)
GFR, EST NON AFRICAN AMERICAN: 52 mL/min — AB (ref >90)

## 2014-04-11 LAB — CBC
HEMATOCRIT: 40 % (ref 39.0–52.0)
HEMOGLOBIN: 13.8 g/dL (ref 13.0–17.0)
MCH: 33.3 pg (ref 26.0–34.0)
MCHC: 34.5 g/dL (ref 30.0–36.0)
MCV: 96.6 fL (ref 78.0–100.0)
Platelets: 120 10*3/uL — ABNORMAL LOW (ref 150–400)
RBC: 4.14 MIL/uL — ABNORMAL LOW (ref 4.22–5.81)
RDW: 13.4 % (ref 11.5–15.5)
WBC: 10.1 10*3/uL (ref 4.0–10.5)

## 2014-04-11 SURGERY — CREATION, BYPASS, ARTERIAL, FEMORAL TO FEMORAL, USING GRAFT
Anesthesia: General | Site: Groin | Laterality: Bilateral

## 2014-04-11 MED ORDER — SODIUM CHLORIDE 0.9 % IV SOLN
10.0000 mg | INTRAVENOUS | Status: DC | PRN
  Administered 2014-04-11: 25 ug/min via INTRAVENOUS

## 2014-04-11 MED ORDER — 0.9 % SODIUM CHLORIDE (POUR BTL) OPTIME
TOPICAL | Status: DC | PRN
  Administered 2014-04-11 (×4): 1000 mL

## 2014-04-11 MED ORDER — PROPOFOL 10 MG/ML IV BOLUS
INTRAVENOUS | Status: DC | PRN
  Administered 2014-04-11: 150 mg via INTRAVENOUS

## 2014-04-11 MED ORDER — PANTOPRAZOLE SODIUM 40 MG PO TBEC
40.0000 mg | DELAYED_RELEASE_TABLET | Freq: Every day | ORAL | Status: DC
  Administered 2014-04-11 – 2014-04-13 (×3): 40 mg via ORAL
  Filled 2014-04-11 (×3): qty 1

## 2014-04-11 MED ORDER — NEOSTIGMINE METHYLSULFATE 10 MG/10ML IV SOLN
INTRAVENOUS | Status: AC
  Filled 2014-04-11: qty 2

## 2014-04-11 MED ORDER — THROMBIN 20000 UNITS EX SOLR
CUTANEOUS | Status: AC
  Filled 2014-04-11: qty 20000

## 2014-04-11 MED ORDER — ONDANSETRON HCL 4 MG/2ML IJ SOLN
INTRAMUSCULAR | Status: DC | PRN
  Administered 2014-04-11: 4 mg via INTRAVENOUS

## 2014-04-11 MED ORDER — ASPIRIN 81 MG PO TABS
81.0000 mg | ORAL_TABLET | Freq: Every day | ORAL | Status: DC
  Administered 2014-04-12 – 2014-04-13 (×2): 81 mg via ORAL
  Filled 2014-04-11 (×3): qty 1

## 2014-04-11 MED ORDER — SUCCINYLCHOLINE CHLORIDE 20 MG/ML IJ SOLN
INTRAMUSCULAR | Status: AC
  Filled 2014-04-11: qty 1

## 2014-04-11 MED ORDER — GUAIFENESIN-DM 100-10 MG/5ML PO SYRP
15.0000 mL | ORAL_SOLUTION | ORAL | Status: DC | PRN
Start: 2014-04-11 — End: 2014-04-13

## 2014-04-11 MED ORDER — PROTAMINE SULFATE 10 MG/ML IV SOLN
INTRAVENOUS | Status: DC | PRN
  Administered 2014-04-11 (×2): 20 mg via INTRAVENOUS
  Administered 2014-04-11: 10 mg via INTRAVENOUS

## 2014-04-11 MED ORDER — BENAZEPRIL HCL 20 MG PO TABS
20.0000 mg | ORAL_TABLET | Freq: Every day | ORAL | Status: DC
  Administered 2014-04-11 – 2014-04-13 (×3): 20 mg via ORAL
  Filled 2014-04-11 (×3): qty 1

## 2014-04-11 MED ORDER — ACETAMINOPHEN 325 MG PO TABS: 325.0000 mg | ORAL_TABLET | ORAL | Status: DC | PRN

## 2014-04-11 MED ORDER — ALBUMIN HUMAN 5 % IV SOLN
INTRAVENOUS | Status: DC | PRN
  Administered 2014-04-11: 10:00:00 via INTRAVENOUS

## 2014-04-11 MED ORDER — LIDOCAINE HCL (CARDIAC) 20 MG/ML IV SOLN
INTRAVENOUS | Status: DC | PRN
  Administered 2014-04-11: 50 mg via INTRAVENOUS

## 2014-04-11 MED ORDER — MAGNESIUM SULFATE 40 MG/ML IJ SOLN
2.0000 g | Freq: Every day | INTRAMUSCULAR | Status: DC | PRN
  Filled 2014-04-11: qty 50

## 2014-04-11 MED ORDER — MIDAZOLAM HCL 2 MG/2ML IJ SOLN
INTRAMUSCULAR | Status: AC
  Filled 2014-04-11: qty 2

## 2014-04-11 MED ORDER — HEPARIN SODIUM (PORCINE) 1000 UNIT/ML IJ SOLN
INTRAMUSCULAR | Status: DC | PRN
  Administered 2014-04-11: 6000 [IU] via INTRAVENOUS
  Administered 2014-04-11 (×2): 2000 [IU] via INTRAVENOUS

## 2014-04-11 MED ORDER — AMLODIPINE BESYLATE 10 MG PO TABS
10.0000 mg | ORAL_TABLET | Freq: Every day | ORAL | Status: DC
  Administered 2014-04-13: 10 mg via ORAL
  Filled 2014-04-11 (×2): qty 1

## 2014-04-11 MED ORDER — HYDROCHLOROTHIAZIDE 25 MG PO TABS
25.0000 mg | ORAL_TABLET | Freq: Every day | ORAL | Status: DC
  Administered 2014-04-13: 25 mg via ORAL
  Filled 2014-04-11 (×2): qty 1

## 2014-04-11 MED ORDER — DOPAMINE-DEXTROSE 3.2-5 MG/ML-% IV SOLN: 3.0000 ug/kg/min | INTRAVENOUS | Status: DC

## 2014-04-11 MED ORDER — STERILE WATER FOR IRRIGATION IR SOLN
Status: DC | PRN
  Administered 2014-04-11: 1000 mL

## 2014-04-11 MED ORDER — ROCURONIUM BROMIDE 50 MG/5ML IV SOLN
INTRAVENOUS | Status: AC
  Filled 2014-04-11: qty 1

## 2014-04-11 MED ORDER — FENTANYL CITRATE 0.05 MG/ML IJ SOLN
INTRAMUSCULAR | Status: AC
  Filled 2014-04-11: qty 5

## 2014-04-11 MED ORDER — MORPHINE SULFATE 2 MG/ML IJ SOLN
2.0000 mg | INTRAMUSCULAR | Status: DC | PRN
  Administered 2014-04-11: 2 mg via INTRAVENOUS
  Filled 2014-04-11: qty 1

## 2014-04-11 MED ORDER — DEXTROSE 5 % IV SOLN
1.5000 g | Freq: Two times a day (BID) | INTRAVENOUS | Status: AC
  Administered 2014-04-11 – 2014-04-12 (×2): 1.5 g via INTRAVENOUS
  Filled 2014-04-11 (×2): qty 1.5

## 2014-04-11 MED ORDER — GLYCOPYRROLATE 0.2 MG/ML IJ SOLN
INTRAMUSCULAR | Status: AC
  Filled 2014-04-11: qty 4

## 2014-04-11 MED ORDER — SENNOSIDES-DOCUSATE SODIUM 8.6-50 MG PO TABS
1.0000 | ORAL_TABLET | Freq: Every evening | ORAL | Status: DC | PRN
  Administered 2014-04-11: 1 via ORAL
  Filled 2014-04-11: qty 1

## 2014-04-11 MED ORDER — SODIUM CHLORIDE 0.9 % IV SOLN
500.0000 mL | Freq: Once | INTRAVENOUS | Status: AC | PRN
  Administered 2014-04-11: 500 mL via INTRAVENOUS

## 2014-04-11 MED ORDER — OXYCODONE-ACETAMINOPHEN 5-325 MG PO TABS
1.0000 | ORAL_TABLET | ORAL | Status: DC | PRN
  Administered 2014-04-11: 2 via ORAL
  Administered 2014-04-12: 1 via ORAL
  Filled 2014-04-11: qty 1
  Filled 2014-04-11: qty 2
  Filled 2014-04-11: qty 1

## 2014-04-11 MED ORDER — MEPERIDINE HCL 25 MG/ML IJ SOLN: 6.2500 mg | INTRAMUSCULAR | Status: DC | PRN

## 2014-04-11 MED ORDER — SODIUM CHLORIDE 0.9 % IV SOLN: INTRAVENOUS | Status: DC

## 2014-04-11 MED ORDER — FENTANYL CITRATE 0.05 MG/ML IJ SOLN
INTRAMUSCULAR | Status: DC | PRN
  Administered 2014-04-11: 50 ug via INTRAVENOUS
  Administered 2014-04-11: 100 ug via INTRAVENOUS
  Administered 2014-04-11 (×2): 50 ug via INTRAVENOUS

## 2014-04-11 MED ORDER — LABETALOL HCL 5 MG/ML IV SOLN
10.0000 mg | INTRAVENOUS | Status: DC | PRN
  Filled 2014-04-11: qty 4

## 2014-04-11 MED ORDER — PHENYLEPHRINE HCL 10 MG/ML IJ SOLN
INTRAMUSCULAR | Status: DC | PRN
  Administered 2014-04-11: 120 ug via INTRAVENOUS
  Administered 2014-04-11 (×2): 80 ug via INTRAVENOUS

## 2014-04-11 MED ORDER — ONDANSETRON HCL 4 MG/2ML IJ SOLN
INTRAMUSCULAR | Status: AC
  Filled 2014-04-11: qty 2

## 2014-04-11 MED ORDER — NEOSTIGMINE METHYLSULFATE 10 MG/10ML IV SOLN
INTRAVENOUS | Status: DC | PRN
  Administered 2014-04-11: 3 mg via INTRAVENOUS

## 2014-04-11 MED ORDER — SODIUM CHLORIDE 0.9 % IV SOLN
INTRAVENOUS | Status: DC
  Administered 2014-04-11 – 2014-04-12 (×2): via INTRAVENOUS

## 2014-04-11 MED ORDER — METOPROLOL TARTRATE 1 MG/ML IV SOLN
2.0000 mg | INTRAVENOUS | Status: DC | PRN
Start: 2014-04-11 — End: 2014-04-13

## 2014-04-11 MED ORDER — VECURONIUM BROMIDE 10 MG IV SOLR
INTRAVENOUS | Status: DC | PRN
  Administered 2014-04-11: 2 mg via INTRAVENOUS

## 2014-04-11 MED ORDER — CHLORHEXIDINE GLUCONATE 4 % EX LIQD
60.0000 mL | Freq: Once | CUTANEOUS | Status: DC
  Filled 2014-04-11: qty 60

## 2014-04-11 MED ORDER — SODIUM CHLORIDE 0.9 % IR SOLN
Status: DC | PRN
  Administered 2014-04-11: 09:00:00

## 2014-04-11 MED ORDER — VECURONIUM BROMIDE 10 MG IV SOLR
INTRAVENOUS | Status: AC
  Filled 2014-04-11: qty 10

## 2014-04-11 MED ORDER — THROMBIN 20000 UNITS EX SOLR
OROMUCOSAL | Status: DC | PRN
  Administered 2014-04-11: 13:00:00 via TOPICAL

## 2014-04-11 MED ORDER — EPHEDRINE SULFATE 50 MG/ML IJ SOLN
INTRAMUSCULAR | Status: AC
  Filled 2014-04-11: qty 1

## 2014-04-11 MED ORDER — OXYCODONE HCL 5 MG/5ML PO SOLN: 5.0000 mg | Freq: Once | ORAL | Status: DC | PRN

## 2014-04-11 MED ORDER — SIMVASTATIN 20 MG PO TABS
20.0000 mg | ORAL_TABLET | Freq: Every day | ORAL | Status: DC
Start: 2014-04-11 — End: 2014-04-13
  Administered 2014-04-11 – 2014-04-12 (×2): 20 mg via ORAL
  Filled 2014-04-11 (×3): qty 1

## 2014-04-11 MED ORDER — STERILE WATER FOR INJECTION IJ SOLN
INTRAMUSCULAR | Status: AC
  Filled 2014-04-11: qty 10

## 2014-04-11 MED ORDER — ROCURONIUM BROMIDE 100 MG/10ML IV SOLN
INTRAVENOUS | Status: DC | PRN
  Administered 2014-04-11: 50 mg via INTRAVENOUS

## 2014-04-11 MED ORDER — ONDANSETRON HCL 4 MG/2ML IJ SOLN: 4.0000 mg | Freq: Four times a day (QID) | INTRAMUSCULAR | Status: DC | PRN

## 2014-04-11 MED ORDER — LACTATED RINGERS IV SOLN
INTRAVENOUS | Status: DC | PRN
  Administered 2014-04-11 (×2): via INTRAVENOUS

## 2014-04-11 MED ORDER — PROTAMINE SULFATE 10 MG/ML IV SOLN
INTRAVENOUS | Status: AC
  Filled 2014-04-11: qty 5

## 2014-04-11 MED ORDER — HYDROMORPHONE HCL PF 1 MG/ML IJ SOLN: 0.2500 mg | INTRAMUSCULAR | Status: DC | PRN

## 2014-04-11 MED ORDER — MIDAZOLAM HCL 5 MG/5ML IJ SOLN
INTRAMUSCULAR | Status: DC | PRN
  Administered 2014-04-11: 2 mg via INTRAVENOUS

## 2014-04-11 MED ORDER — PHENYLEPHRINE HCL 10 MG/ML IJ SOLN
INTRAMUSCULAR | Status: AC
  Filled 2014-04-11: qty 2

## 2014-04-11 MED ORDER — LIDOCAINE HCL (CARDIAC) 20 MG/ML IV SOLN
INTRAVENOUS | Status: AC
  Filled 2014-04-11: qty 5

## 2014-04-11 MED ORDER — CLOPIDOGREL BISULFATE 75 MG PO TABS
75.0000 mg | ORAL_TABLET | Freq: Every day | ORAL | Status: DC
  Administered 2014-04-11 – 2014-04-13 (×3): 75 mg via ORAL
  Filled 2014-04-11 (×3): qty 1

## 2014-04-11 MED ORDER — ALUM & MAG HYDROXIDE-SIMETH 200-200-20 MG/5ML PO SUSP: 15.0000 mL | ORAL | Status: DC | PRN

## 2014-04-11 MED ORDER — ARTIFICIAL TEARS OP OINT
TOPICAL_OINTMENT | OPHTHALMIC | Status: AC
  Filled 2014-04-11: qty 3.5

## 2014-04-11 MED ORDER — GLYCOPYRROLATE 0.2 MG/ML IJ SOLN
INTRAMUSCULAR | Status: DC | PRN
  Administered 2014-04-11: .4 mg via INTRAVENOUS
  Administered 2014-04-11: .2 mg via INTRAVENOUS

## 2014-04-11 MED ORDER — SODIUM CHLORIDE 0.9 % IJ SOLN
INTRAMUSCULAR | Status: AC
  Filled 2014-04-11: qty 10

## 2014-04-11 MED ORDER — PROMETHAZINE HCL 25 MG/ML IJ SOLN: 6.2500 mg | INTRAMUSCULAR | Status: DC | PRN

## 2014-04-11 MED ORDER — ENOXAPARIN SODIUM 30 MG/0.3ML ~~LOC~~ SOLN
30.0000 mg | SUBCUTANEOUS | Status: DC
  Administered 2014-04-12: 30 mg via SUBCUTANEOUS
  Filled 2014-04-11 (×2): qty 0.3

## 2014-04-11 MED ORDER — GLYCOPYRROLATE 0.2 MG/ML IJ SOLN
INTRAMUSCULAR | Status: AC
  Filled 2014-04-11: qty 1

## 2014-04-11 MED ORDER — BISACODYL 10 MG RE SUPP: 10.0000 mg | Freq: Every day | RECTAL | Status: DC | PRN

## 2014-04-11 MED ORDER — LACTATED RINGERS IV SOLN
INTRAVENOUS | Status: DC | PRN
  Administered 2014-04-11 (×3): via INTRAVENOUS

## 2014-04-11 MED ORDER — POTASSIUM CHLORIDE CRYS ER 20 MEQ PO TBCR
20.0000 meq | EXTENDED_RELEASE_TABLET | Freq: Every day | ORAL | Status: AC | PRN
  Administered 2014-04-12: 40 meq via ORAL
  Filled 2014-04-11: qty 2

## 2014-04-11 MED ORDER — THROMBIN 20000 UNITS EX SOLR
CUTANEOUS | Status: DC | PRN
  Administered 2014-04-11: 11:00:00 via TOPICAL

## 2014-04-11 MED ORDER — ARTIFICIAL TEARS OP OINT
TOPICAL_OINTMENT | OPHTHALMIC | Status: DC | PRN
  Administered 2014-04-11: 1 via OPHTHALMIC

## 2014-04-11 MED ORDER — ACETAMINOPHEN 650 MG RE SUPP: 325.0000 mg | RECTAL | Status: DC | PRN

## 2014-04-11 MED ORDER — PHENOL 1.4 % MT LIQD: 1.0000 | OROMUCOSAL | Status: DC | PRN

## 2014-04-11 MED ORDER — HEPARIN SODIUM (PORCINE) 1000 UNIT/ML IJ SOLN
INTRAMUSCULAR | Status: AC
  Filled 2014-04-11: qty 1

## 2014-04-11 MED ORDER — DOCUSATE SODIUM 100 MG PO CAPS
100.0000 mg | ORAL_CAPSULE | Freq: Every day | ORAL | Status: DC
  Administered 2014-04-12 – 2014-04-13 (×2): 100 mg via ORAL
  Filled 2014-04-11 (×2): qty 1

## 2014-04-11 MED ORDER — OXYCODONE HCL 5 MG PO TABS: 5.0000 mg | ORAL_TABLET | Freq: Once | ORAL | Status: DC | PRN

## 2014-04-11 MED ORDER — EPHEDRINE SULFATE 50 MG/ML IJ SOLN
INTRAMUSCULAR | Status: DC | PRN
  Administered 2014-04-11: 15 mg via INTRAVENOUS
  Administered 2014-04-11 (×2): 10 mg via INTRAVENOUS

## 2014-04-11 MED ORDER — HYDRALAZINE HCL 20 MG/ML IJ SOLN: 10.0000 mg | INTRAMUSCULAR | Status: DC | PRN

## 2014-04-11 SURGICAL SUPPLY — 47 items
BLADE 10 SAFETY STRL DISP (BLADE) ×3 IMPLANT
CANISTER SUCTION 2500CC (MISCELLANEOUS) ×3 IMPLANT
CLIP TI MEDIUM 24 (CLIP) ×3 IMPLANT
CLIP TI WIDE RED SMALL 24 (CLIP) ×3 IMPLANT
COVER PROBE W GEL 5X96 (DRAPES) ×3 IMPLANT
COVER SURGICAL LIGHT HANDLE (MISCELLANEOUS) ×3 IMPLANT
DERMABOND ADVANCED (GAUZE/BANDAGES/DRESSINGS) ×2
DERMABOND ADVANCED .7 DNX12 (GAUZE/BANDAGES/DRESSINGS) ×1 IMPLANT
DRAIN CHANNEL 15F RND FF W/TCR (WOUND CARE) IMPLANT
DRAPE WARM FLUID 44X44 (DRAPE) ×3 IMPLANT
ELECT REM PT RETURN 9FT ADLT (ELECTROSURGICAL) ×3
ELECTRODE REM PT RTRN 9FT ADLT (ELECTROSURGICAL) ×1 IMPLANT
EVACUATOR SILICONE 100CC (DRAIN) IMPLANT
GAUZE SPONGE 4X4 16PLY XRAY LF (GAUZE/BANDAGES/DRESSINGS) ×3 IMPLANT
GLOVE BIO SURGEON STRL SZ7 (GLOVE) ×3 IMPLANT
GLOVE BIOGEL PI IND STRL 7.5 (GLOVE) ×1 IMPLANT
GLOVE BIOGEL PI INDICATOR 7.5 (GLOVE) ×2
GOWN STRL REUS W/ TWL LRG LVL3 (GOWN DISPOSABLE) ×3 IMPLANT
GOWN STRL REUS W/TWL LRG LVL3 (GOWN DISPOSABLE) ×6
GRAFT HEMASHIELD 8MM (Vascular Products) ×2 IMPLANT
GRAFT VASC STRG 30X8KNIT (Vascular Products) ×1 IMPLANT
INSERT FOGARTY SM (MISCELLANEOUS) ×3 IMPLANT
KIT BASIN OR (CUSTOM PROCEDURE TRAY) ×3 IMPLANT
KIT ROOM TURNOVER OR (KITS) ×3 IMPLANT
LOOP VESSEL MINI RED (MISCELLANEOUS) ×9 IMPLANT
NS IRRIG 1000ML POUR BTL (IV SOLUTION) ×6 IMPLANT
PACK PERIPHERAL VASCULAR (CUSTOM PROCEDURE TRAY) ×3 IMPLANT
PAD ARMBOARD 7.5X6 YLW CONV (MISCELLANEOUS) ×6 IMPLANT
PATCH VASCULAR VASCU GUARD 1X6 (Vascular Products) ×6 IMPLANT
PENCIL BUTTON HOLSTER BLD 10FT (ELECTRODE) ×3 IMPLANT
SPONGE SURGIFOAM ABS GEL 100 (HEMOSTASIS) ×6 IMPLANT
STAPLER VISISTAT 35W (STAPLE) IMPLANT
SUT MNCRL AB 4-0 PS2 18 (SUTURE) ×6 IMPLANT
SUT PROLENE 5 0 C 1 24 (SUTURE) ×9 IMPLANT
SUT PROLENE 6 0 BV (SUTURE) ×42 IMPLANT
SUT SILK 2 0 FS (SUTURE) IMPLANT
SUT SILK 3 0 (SUTURE) ×4
SUT SILK 3-0 18XBRD TIE 12 (SUTURE) ×2 IMPLANT
SUT VIC AB 2-0 CT1 27 (SUTURE) ×4
SUT VIC AB 2-0 CT1 TAPERPNT 27 (SUTURE) ×2 IMPLANT
SUT VIC AB 3-0 SH 27 (SUTURE) ×4
SUT VIC AB 3-0 SH 27X BRD (SUTURE) ×2 IMPLANT
TOWEL OR 17X24 6PK STRL BLUE (TOWEL DISPOSABLE) ×6 IMPLANT
TOWEL OR 17X26 10 PK STRL BLUE (TOWEL DISPOSABLE) ×3 IMPLANT
TRAY FOLEY CATH 16FRSI W/METER (SET/KITS/TRAYS/PACK) ×3 IMPLANT
UNDERPAD 30X30 INCONTINENT (UNDERPADS AND DIAPERS) ×3 IMPLANT
WATER STERILE IRR 1000ML POUR (IV SOLUTION) ×3 IMPLANT

## 2014-04-11 NOTE — Progress Notes (Signed)
Care assumed from d walker rn

## 2014-04-11 NOTE — Anesthesia Postprocedure Evaluation (Signed)
Anesthesia Post Note  Patient: Jeffrey Stout  Procedure(s) Performed: Procedure(s) (LRB): BYPASS GRAFT LEFT-RIGHT FEMORAL-FEMORAL ARTERY and bilateral femoral endarterectomies with Bovine Patch Angioplasties. (Bilateral)  Anesthesia type: General  Patient location: PACU  Post pain: Pain level controlled  Post assessment: Post-op Vital signs reviewed  Last Vitals: BP 110/59  Pulse 68  Temp(Src) 36.1 C (Oral)  Resp 12  Ht 6\' 2"  (1.88 m)  Wt 176 lb (79.833 kg)  BMI 22.59 kg/m2  SpO2 100%  Post vital signs: Reviewed  Level of consciousness: sedated  Complications: No apparent anesthesia complications

## 2014-04-11 NOTE — H&P (View-Only) (Signed)
Postoperative Visit   History of Present Illness  Jeffrey Stout is a 63 y.o. male who presents for postoperative follow-up from procedure on Date: 02/16/15: L CIA PTA+S, L renal artery PTA+S.  The angiogram demonstrated calcified CTO R CIA.  His right leg sx remain as previously: numbness in R leg not consistent with his PAD.  He denies rest pain or classic intermittent claudication sx.  Pt is scheduled for a nuclear stress test as part of his preop evaluation for possible femorofemoral bypass.  In regards to his HTN: he has come down from 5 medications to 2 medications.  He denies any change in bowel and bladder habits.    Past Medical History  Diagnosis Date  . Hypertension   . TIA (transient ischemic attack)   . Peripheral arterial disease   . Hyperlipidemia     Past Surgical History  Procedure Laterality Date  . Cholecystectomy      History   Social History  . Marital Status: Married    Spouse Name: N/A    Number of Children: N/A  . Years of Education: N/A   Occupational History  . Not on file.   Social History Main Topics  . Smoking status: Former Smoker    Quit date: 04/24/2013  . Smokeless tobacco: Never Used  . Alcohol Use: No  . Drug Use: No  . Sexual Activity: Not on file   Other Topics Concern  . Not on file   Social History Narrative  . No narrative on file    Family History  Problem Relation Age of Onset  . Diabetes Mother   . Hypertension Brother   . Heart attack Brother      Current Outpatient Prescriptions on File Prior to Visit  Medication Sig Dispense Refill  . amLODipine (NORVASC) 10 MG tablet Take 10 mg by mouth daily.      Marland Kitchen aspirin 81 MG tablet Take 81 mg by mouth daily.      . benazepril (LOTENSIN) 40 MG tablet Take 40 mg by mouth. TAKES 1/2 TABLET      . clopidogrel (PLAVIX) 75 MG tablet Take 1 tablet (75 mg total) by mouth daily.  30 tablet  11  . hydrochlorothiazide (HYDRODIURIL) 25 MG tablet Take 25 mg by mouth daily.        . pravastatin (PRAVACHOL) 40 MG tablet Take 40 mg by mouth at bedtime.        No current facility-administered medications on file prior to visit.    No Known Allergies  REVIEW OF SYSTEMS:  (Positives checked otherwise negative)  CARDIOVASCULAR:  []  chest pain, []  chest pressure, []  palpitations, []  shortness of breath when laying flat, []  shortness of breath with exertion,  []  pain in feet when walking, []  pain in feet when laying flat, []  history of blood clot in veins (DVT), []  history of phlebitis, []  swelling in legs, []  varicose veins  PULMONARY:  []  productive cough, []  asthma, []  wheezing  NEUROLOGIC:  []  weakness in arms or legs, [x]  numbness in arms or legs, []  difficulty speaking or slurred speech, []  temporary loss of vision in one eye, []  dizziness  HEMATOLOGIC:  []  bleeding problems, []  problems with blood clotting too easily  MUSCULOSKEL:  []  joint pain, []  joint swelling  GASTROINTEST:  []  vomiting blood, []  blood in stool     GENITOURINARY:  []  burning with urination, []  blood in urine  PSYCHIATRIC:  []  history of major depression  INTEGUMENTARY:  []   rashes, []  ulcers     For VQI Use Only  PRE-ADM LIVING: Home  AMB STATUS: Ambulatory  Physical Examination  Filed Vitals:   03/23/14 1331  BP: 129/80  Pulse: 85  Height: 6\' 2"  (1.88 m)  Weight: 175 lb 6.4 oz (79.561 kg)  SpO2: 98%   Body mass index is 22.51 kg/(m^2).  General: A&O x 3, WDWN  Pulmonary: Sym exp, good air movt, CTAB, no rales, rhonchi, & wheezing  Cardiac: RRR, Nl S1, S2, no Murmurs, rubs or gallops  Vascular: Vessel Right Left  Radial Palpable Palpable  Ulnar Palpable Palpable  Brachial Palpable Palpable  Carotid Palpable, without bruit Palpable, without bruit  Aorta Not palpable N/A  Femoral Palpable Palpable  Popliteal Not palpable Not palpable  PT Not Palpable Palpable  DP Not Palpable Palpable    Gastrointestinal: soft, NTND, -G/R, - HSM, - masses, - CVAT  B  Musculoskeletal: M/S 5/5 throughout , Extremities without ischemic changes , Bil. groin without hematoma, no echymosis present at either cannulation sites  Neurologic:  Pain and light touch intact in extremities , Motor exam as listed above  Medical Decision Making  Jeffrey Stout is a 63 y.o. male who presents s/p L RAS for renovascular HTN, L CIA PTA+S, R CIA CTO.  Surprisingly this patient had a significant result to stenting the L renal artery leading to significant reduction in his BP regimen.  This strongly suggest renovascular HTN in this patient.  He will need q3 month renal duplex to watch this L RAS.  The pt's anatomy was not compatible with commercial stent so there is protrusion of the anterior edge of the stent into the flow lumen of the aorta/renal artery border Based on his angiographic findings, this patient needs: L to R femorofemoral bypass. The risk, benefits, and alternative for bypass operations were discussed with the patient.  The patient is aware the risks include but are not limited to: bleeding, infection, myocardial infarction, stroke, limb loss, nerve damage, need for additional procedures in the future, wound complications, and inability to complete the bypass.   The patient is aware of these risks and agreed to proceed.  He will be scheduled for 19 AUG 15 assuming his cardiac risk stratification and optimization does not delay his case. I discussed in depth with the patient the nature of atherosclerosis, and emphasized the importance of maximal medical management including strict control of blood pressure, blood glucose, and lipid levels, obtaining regular exercise, and cessation of smoking.  The patient is aware that without maximal medical management the underlying atherosclerotic disease process will progress, limiting the benefit of any interventions. The patient is currently on a statin: Pravachol.    The patient is currently on an anti-platelet: Plavix.     Thank you for allowing [place] to participate in this patient's care.  Adele Barthel, MD [facility] Office: [phone] Pager: [phone]

## 2014-04-11 NOTE — Transfer of Care (Signed)
Immediate Anesthesia Transfer of Care Note  Patient: Jeffrey Stout  Procedure(s) Performed: Procedure(s): BYPASS GRAFT LEFT-RIGHT FEMORAL-FEMORAL ARTERY and bilateral femoral endarterectomies with Bovine Patch Angioplasties. (Bilateral)  Patient Location: PACU  Anesthesia Type:General  Level of Consciousness: awake, alert  and oriented  Airway & Oxygen Therapy: Patient Spontanous Breathing and Patient connected to face mask oxygen  Post-op Assessment: Report given to PACU RN, Post -op Vital signs reviewed and stable and Patient moving all extremities X 4  Post vital signs: Reviewed and stable  Complications: No apparent anesthesia complications

## 2014-04-11 NOTE — Progress Notes (Signed)
Dr Bridgett Larsson notified of inability to palpate or dopple pulses r foot, yet color is good, cap refill is good, pt has no pain, movement and sensation intact. Staes this was case in or end of case, not alarmed. Came by now to [facility] and same, is ok with this finding.

## 2014-04-11 NOTE — Progress Notes (Signed)
   Daily Progress Note  Assessment/Planning: POD #0 s/p L-to-R fem-fem BPG, L SFA EA, L fem BPA, R fem EA w/ BPA   Pt has no pain in right foot with complete motor and sensation.  The right foot is pink and viable.  Obviously this is in complete conflict with the inability to obtain doppler signals.  I suspect his R tibial arteries are in some vasospasm from the cold in the OR.  Hopefully in a few hours, a doppler signal can be found.  Subjective  - Day of Surgery  Pain ok  Objective Filed Vitals:   04/11/14 1313 04/11/14 1345 04/11/14 1500 04/11/14 1530  BP: 99/62 110/59    Pulse:  79  68  Temp: 98.4 F (36.9 C)  97 F (36.1 C)   TempSrc:      Resp:  19  12  Height:      Weight:      SpO2:  99%  100%    Intake/Output Summary (Last 24 hours) at 04/11/14 1612 Last data filed at 04/11/14 1240  Gross per 24 hour  Intake   3750 ml  Output    650 ml  Net   3100 ml    PULM  BLL rales CV  RRR GI  soft, NTND VASC  B feet pink and viable, L palpable DP, R faint DP, no signals in R foot though  Laboratory CBC    Component Value Date/Time   WBC 9.5 04/05/2014 1413   HGB 17.8* 04/05/2014 1413   HCT 50.2 04/05/2014 1413   PLT 185 04/05/2014 1413    BMET    Component Value Date/Time   NA 136* 04/05/2014 1413   K 3.8 04/05/2014 1413   CL 95* 04/05/2014 1413   CO2 26 04/05/2014 1413   GLUCOSE 91 04/05/2014 1413   BUN 17 04/05/2014 1413   CREATININE 1.40* 04/05/2014 1413   CALCIUM 9.6 04/05/2014 1413   GFRNONAA 52* 04/05/2014 [place]* 04/05/2014 [place], MD [facility] Office: [phone] Pager: [phone]  04/11/2014, 4:12 PM

## 2014-04-11 NOTE — Op Note (Signed)
OPERATIVE NOTE   PROCEDURE: 1. Left to right femorofemoral bypass 2. Right femoral endarterectomy with bovine patch angioplasty 3. Limited left femoral endarterectomy with bovine patch angioplasty left common femoral artery   PRE-OPERATIVE DIAGNOSIS: Right common iliac artery occlusion  POST-OPERATIVE DIAGNOSIS: same as above   SURGEON: Adele Barthel, MD  ASSISTANT(S): Gerri Lins, PAC   ANESTHESIA: general  ESTIMATED BLOOD LOSS: 350 cc  FINDING(S): 1. Severely calcified right common femoral artery  2. Near occlusion of proximal left superficial femoral artery with calcific plaque 3. Palpable left dorsalis pedis artery with dopplerable posterior tibial artery  4. Faintly palpable dorsalis pedis artery, difficulty finding tibial signals  SPECIMEN(S):  none  INDICATIONS:   Jeffrey Stout is a 63 y.o. male who presents with atypical right leg claudication with right common iliac artery not amendable to endovascular intervention.  In order to improve perfusion of the right leg, I recommended left to right femorofemoral bypass.  The risk, benefits, and alternative for bypass operations were discussed with the patient.  The patient is aware the risks include but are not limited to: bleeding, infection, myocardial infarction, stroke, limb loss, nerve damage, need for additional procedures in the future, wound complications, and inability to complete the bypass.  The patient is aware of these risks and agreed to proceed.  DESCRIPTION:  After obtaining full informed written consent, the patient was brought back to the operating room and placed supine upon the operating table.  The patient received IV antibiotics prior to induction.  After obtaining adequate anesthesia, the patient was prepped and draped in the standard fashion for: a femorofemoral bypass.  I made longitudinal incision over the left common femoral artery.  Using electrocautery, I dissected out the left femoral artery from  inguinal ligament to femoral bifurcation.  In the process, I found a high femoral bifurcation and realized I had dissected out the proximal left superficial femoral artery.  The artery felt calcified posteriorly so I was hoping to avoid an endarterectomy on the left side.  I placed vessel loops around all branches.  I turned my attention to the right groin.  In a similar fashion, I made an incision over the right common femoral artery and dissected out the entirety of this artery.  This artery was circumferentially calcified, so I was going to have to do an endarterectomy on this side.  I placed vessel loops around all branches.     At this point, I dissected a subcutaneous tissue superficial to the pubic bone for the femorofemoral graft.  I passed a 8 mm Dacron graft through the subcutaneous tunnel, leaving enough length in each groin.   At this point, the patient was given 6000 units of Heparin intravenously, which was a therapeutic bolus.  In total, 10000 units of Heparin was administrated to achieve and maintain a therapeutic level of anticoagulation.  I clamped the external iliac artery and placed the profunda femoral artery and superficial femoral artery under tension.  I made an arteriotomy with a 11-blade and extended it proximally and distally with a Potts scissor.  A large calcific plaque was evident, extending into the superficial femoral artery.  I extended the arteriotomy distally into the proximal superficial femoral artery, distal to the calcific plaque.  There was focal, near lumen occluding plaque located here.  The rest of the common femoral artery demonstrated some posterior plaque that was not compromising the lumen of this artery.  I did a focal endarterectomy on this superficial femoral artery plaque.  I tacked down the residual plaque with 6-0 prolene sutures.  The remaining plaque was densely adherent.  At this point, I fashion a bovine pericardial patch that had been soaking to the  geometry of this arteriotomy.  It was sewn in with a running stitch of 6-0 Prolene.  Prior to completing this patch angioplasty, I backbled all arteries: good flow was noted from each end of the artery and profunda femoral artery.  I made slit in the bovine patch and extended it proximally and distally.  I then spatulated the left end of the graft to meet the geometry of the patch opening.  The graft was sewn to the bovine pericardium patched left common femoral artery with a running stitch of 5-0 Prolene.  After completing the anastomosis, I bled the femorofemoral graft.  No thrombus was present.    At this point, I clamped the graft in the left groin and washed out the graft with heparinized saline.  I turned my attention to the right groin.  I reset my exposure of the entire femoral artery system.  I clamped the profunda femoral artery, superficial femoral artery, and distal external iliac artery.  I made an arteriotomy with some difficult with a 11-blade.  I extended this arteriotomy proximal into the distal external iliac artery and distally into the superficial femoral artery.  Using some difficulty completed an endarterectomy of this severely calcified femoral artery system.  I carried the dissection into the right common femoral artery and then carried the dissection into the proximal superficial femoral artery and profunda arteries.  The plaque appeared to feather out into the profunda femoral artery and superficial femoral artery.  After extracting all loose intimal flaps in the iliofemoral artery, I felt the residual disease could not be removed due to severe attenuation of portions of the arterial wall but the calcific atherosclerosis.  I fashioned a bovine patch for the geometry of this arteriotomy.  The patch was sewn into placed with a 2 running stitches of 6-0 Prolene.  Prior to completing this patch angioplasty, I backbled the external iliac artery, superficial femoral artery and profunda femoral  artery: no thrombus was present, reasonable backbleeding was present.  I completed the patch angioplasty in the usual fashion.  I made a slit in the bovine patch and extended it proximally and distally.  I pulled the femorofemoral graft to appropriate tension.  I then spatulated the right end of the graft to meet the geometry of the patch opening, adjusting the length in the process.  The graft was sewn to the bovine pericardium patched right common femoral artery with a running stitch of 5-0 Prolene.  I again backbled the right common femoral artery and the femorofemoral graft prior to completing the anastomosis: no thrombus was noted.  I completed the anastomosis in the usual fashion.  I released all clamps: immediately a right common femoral artery was palpable as was a pulse in the profunda femoral artery and proximal superficial femoral artery.   Both groins were interrogated with a doppler:  Multiphasic signals were present in the left common femoral artery, profunda femoral artery, and superficial femoral artery.  Multiple phasic signals were present in the right common femoral artery and profunda femoral artery.  There was a waterhammer signal in the proximal superficial femoral artery with no signal distal to this, as expected given prior superficial femoral artery occlusion.    At this point, the patient was given 50 mg of Protamine to reverse anticoagulation.  I  washed out both groins and then repaired bleeding points with 6-0 Prolene.  Thrombin and gelfoam was applied to both groin wounds.  After waiting a few minutes, a few focal areas of of bleeding were repaired with 6-0 prolenes.  After another round of thrombin and gelfoam, no active bleeding was present.  I repaired each incision with a double layer of 2-0 Vicryl deep in the incision, followed by a double layer of 3-0 Vicryl, and then finally a running subcuticular of 4-0 Monocryl in both groins.  The skin was cleaned, dried, and reinforced with  Dermabond.   At the end of the case, both feet were pink and viable.  The patient had palpable left dorsalis pedis pulse with dopplerable posterior tibial artery.  I could palpate a faint right dorsalis pedis pulse but had difficulties identifying a tibial signal.  I expect the patient is in vasospasm given the clinical evidence of grossly viable foot.  I expect his signals to improve once his temperature improves.  COMPLICATIONS: none  CONDITION: stable  Adele Barthel, MD [facility] [place] Office: [phone] Pager: [phone]  04/11/2014, 1:05 PM

## 2014-04-11 NOTE — OR Nursing (Signed)
Gaye Alken, RN charting under Thomasene Mohair, RN from [phone] to [phone].

## 2014-04-11 NOTE — Interval H&P Note (Signed)
History and Physical Interval Note:  04/11/2014 7:34 AM  Jeffrey Stout  has presented today for surgery, with the diagnosis of Peripheral Arterial Disease  The various methods of treatment have been discussed with the patient and family. After consideration of risks, benefits and other options for treatment, the patient has consented to  Procedure(s): BYPASS GRAFT LEFT-RIGHT FEMORAL-FEMORAL ARTERY (Bilateral) as a surgical intervention .  The patient's history has been reviewed, patient examined, no change in status, stable for surgery.  I have reviewed the patient's chart and labs.  Questions were answered to the patient's satisfaction.     CHEN,BRIAN LIANG-YU

## 2014-04-11 NOTE — Progress Notes (Signed)
Pt arrived from PACU, VSS, denies pain, N/V, neuro intact, palp DP pulses bilat. Will continue to monitor.

## 2014-04-12 ENCOUNTER — Encounter (HOSPITAL_COMMUNITY): Payer: Self-pay | Admitting: Vascular Surgery

## 2014-04-12 LAB — BASIC METABOLIC PANEL
ANION GAP: 9 (ref 5–15)
BUN: 14 mg/dL (ref 6–23)
CHLORIDE: 98 meq/L (ref 96–112)
CO2: 29 mEq/L (ref 19–32)
Calcium: 8.6 mg/dL (ref 8.4–10.5)
Creatinine, Ser: 1.38 mg/dL — ABNORMAL HIGH (ref 0.50–1.35)
GFR calc Af Amer: 61 mL/min — ABNORMAL LOW (ref >90)
GFR calc non Af Amer: 53 mL/min — ABNORMAL LOW (ref >90)
Glucose, Bld: 148 mg/dL — ABNORMAL HIGH (ref 70–99)
Potassium: 3.1 mEq/L — ABNORMAL LOW (ref 3.7–5.3)
SODIUM: 136 meq/L — AB (ref 137–147)

## 2014-04-12 LAB — CBC
HCT: 37.5 % — ABNORMAL LOW (ref 39.0–52.0)
HEMOGLOBIN: 12.7 g/dL — AB (ref 13.0–17.0)
MCH: 32.8 pg (ref 26.0–34.0)
MCHC: 33.9 g/dL (ref 30.0–36.0)
MCV: 96.9 fL (ref 78.0–100.0)
PLATELETS: 84 10*3/uL — AB (ref 150–400)
RBC: 3.87 MIL/uL — ABNORMAL LOW (ref 4.22–5.81)
RDW: 13.2 % (ref 11.5–15.5)
WBC: 8.4 10*3/uL (ref 4.0–10.5)

## 2014-04-12 MED ORDER — SODIUM CHLORIDE 0.9 % IV SOLN: INTRAVENOUS | Status: DC

## 2014-04-12 NOTE — Progress Notes (Signed)
OT Cancellation Note  Patient Details Name: Jeffrey Stout MRN: [id number] DOB: 07-Jun-1951   Cancelled Treatment:    Reason Eval/Treat Not Completed: Other (comment) Pt being transferred to 2W. Will see later this pm. [place], OTR/L  [phone] 04/12/2014 04/12/2014, 11:10 AM

## 2014-04-12 NOTE — Progress Notes (Signed)
Utilization review completed.  

## 2014-04-12 NOTE — Progress Notes (Signed)
   Daily Progress Note  Assessment/Planning: POD #1 s/p L to R fem-fem BPG, L SFA EA, L fem BPA, R fem EA and BPA   Ok to transfer to floor  D/C foley  D/C MIVF  PT/OT  ABI  Subjective  - 1 Day Post-Op  Pain controlled  Objective Filed Vitals:   04/11/14 1600 04/11/14 2013 04/12/14 0030 04/12/14 0443  BP: 127/68  121/74 124/68  Pulse:   91 88  Temp: 97.9 F (36.6 C) 97.7 F (36.5 C) 97 F (36.1 C) 99.4 F (37.4 C)  TempSrc: Oral Oral Oral Oral  Resp:   17 16  Height: 6\' 2"  (1.88 m)     Weight: 166 lb 10.7 oz (75.6 kg)     SpO2:   98% 98%    Intake/Output Summary (Last 24 hours) at 04/12/14 0742 Last data filed at 04/12/14 0700  Gross per 24 hour  Intake 5447.5 ml  Output   1350 ml  Net 4097.5 ml    PULM  CTAB CV  RRR GI  soft, NTND VASC  Both feet pink, palpable L DP > R DP, dopplerable this AM  Laboratory CBC    Component Value Date/Time   WBC 8.4 04/12/2014 0307   HGB 12.7* 04/12/2014 0307   HCT 37.5* 04/12/2014 0307   PLT 84* 04/12/2014 0307    BMET    Component Value Date/Time   NA 136* 04/12/2014 0307   K 3.1* 04/12/2014 0307   CL 98 04/12/2014 0307   CO2 29 04/12/2014 0307   GLUCOSE 148* 04/12/2014 0307   BUN 14 04/12/2014 0307   CREATININE 1.38* 04/12/2014 0307   CALCIUM 8.6 04/12/2014 0307   GFRNONAA 53* 04/12/2014 0307   GFRAA 61* 04/12/2014 0307    Adele Barthel, MD [facility] Office: [phone] Pager: [phone]  04/12/2014, 7:42 AM

## 2014-04-12 NOTE — Progress Notes (Signed)
Occupational Therapy Evaluation Patient Details Name: Jeffrey Stout MRN: [id number] DOB: [birth year] Today's Date: 04/12/2014    History of Present Illness Pt is a 63 y.o. male s/p L to R fem-fem BPG, L SFA EA, L fem BPA, R fem EA and BPA.   Clinical Impression   Pt making excellent progress. Completed all education regarding compensatory strategies for ADL and mobility for ADL. Discussed home set up/modifications to reduce risk of falls after D/C. Pt/wife verbalized understanding. Encouraged pt to walk with staff. OT signing off. Thank you.    Follow Up Recommendations  No OT follow up;Supervision - Intermittent    Equipment Recommendations  None recommended by OT    Recommendations for Other Services   none    Precautions / Restrictions Precautions Precautions: None Restrictions Weight Bearing Restrictions: No      Mobility Bed Mobility Overal bed mobility: Modified Independent             General bed mobility comments: Educated on using rolling/side-lying to sit to reduce stress on abdomen  Transfers Overall transfer level: Needs assistance Equipment used: None Transfers: Stand Pivot Transfers;Sit to/from Stand Sit to Stand: Supervision Stand pivot transfers: Min guard       General transfer comment: reaching for IV pole for support; cues for hand placement and sequencing    Balance Overall balance assessment: Needs assistance Sitting-balance support: Feet supported Sitting balance-Leahy Scale: Normal    Standing balance support: Single extremity supported;During functional activity Standing balance-Leahy Scale: Fair                            ADL Overall ADL's : Needs assistance/impaired     Grooming: Modified independent   Upper Body Bathing: Modified independent   Lower Body Bathing: Set up;Supervison/ safety   Upper Body Dressing : Modified independent   Lower Body Dressing: Minimal assistance   Toilet Transfer: Min guard    Toileting- Clothing Manipulation and Hygiene: Modified independent       Functional mobility during ADLs: Min guard General ADL Comments: limited primarily by pain.Educated pt/wife on compensatory techniques for LB ADL. Rec pt use a showerchair initially to reduce risk of falls. Discussed home set up and saefty for fall reduciton. Demonstrated understanding.wife can assist as needed.     Vision                     Perception     Praxis      Pertinent Vitals/Pain Pain Assessment: 0-10 Pain Score: 5  Pain Location: abdomen Pain Descriptors / Indicators: Aching;Burning Pain Intervention(s): Monitored during session;Patient requesting pain meds-RN notified;RN gave pain meds during session     Hand Dominance Right   Extremity/Trunk Assessment Upper Extremity Assessment Upper Extremity Assessment: Overall WFL for tasks assessed   Lower Extremity Assessment Lower Extremity Assessment: Overall WFL for tasks assessed   Cervical / Trunk Assessment Cervical / Trunk Assessment: Normal   Communication Communication Communication: No difficulties   Cognition Arousal/Alertness: Awake/alert Behavior During Therapy: WFL for tasks assessed/performed Overall Cognitive Status: Within Functional Limits for tasks assessed                     General Comments       Exercises       Shoulder Instructions      Home Living Family/patient expects to be discharged to:: Private residence Living Arrangements: Spouse/significant other Available Help at Discharge: Family;Available 24 hours/day Type  of Home: House Home Access: Stairs to enter [employer] of Steps: 3 Entrance Stairs-Rails: None Home Layout: One level     Bathroom Shower/Tub: Tub/shower unit Shower/tub characteristics: Architectural technologist: Standard Bathroom Accessibility: Yes How Accessible: Accessible via walker Home Equipment: None          Prior Functioning/Environment Level of  Independence: Independent        Comments: pt independent with all ADLs; works as a Surveyor, mining at [employer]     OT Diagnosis:     OT Problem List:     OT Treatment/Interventions:      OT Goals(Current goals can be found in the care plan section) Acute Rehab OT Goals Patient Stated Goal: to go home and recover  OT Frequency:     Barriers to D/C:            Co-evaluation              End of Session Equipment Utilized During Treatment: Gait belt Nurse Communication: Mobility status;Patient requests pain meds  Activity Tolerance: Patient tolerated treatment well Patient left: in chair;with call bell/phone within reach;with family/visitor present   Time: 4196-2229 OT Time Calculation (min): 23 min Charges:  OT General Charges $OT Visit: 1 Procedure OT Evaluation $Initial OT Evaluation Tier I: 1 Procedure OT Treatments $Self Care/Home Management : 8-22 mins G-Codes:    [place] Haydon,HILLARY 05-01-2014, 2:53 PM   [facility], OTR/L  [phone] 01-May-2014

## 2014-04-12 NOTE — Progress Notes (Signed)
       Subjective  - Doing well. He ate supper last night without problems.   Objective 124/68 88 99.4 F (37.4 C) (Oral) 16 98%  Intake/Output Summary (Last 24 hours) at 04/12/14 0742 Last data filed at 04/12/14 0700  Gross per 24 hour  Intake 5447.5 ml  Output   1350 ml  Net 4097.5 ml    Palpable DP pulses bilateral Incision C/D soft without hematoma   Assessment/Planning: POD #1 s/p L-to-R fem-fem BPG, L SFA EA, L fem BPA, R fem EA w/ BPA Taking PO's well Pain controlled Lovenox for DVT prophylaxis Plan D/C foley , ambulate transfer to 2W    Laurence Slate [facility] 04/12/2014 7:42 AM --  Laboratory Lab Results:  Recent Labs  04/11/14 1925 04/12/14 0307  WBC 10.1 8.4  HGB 13.8 12.7*  HCT 40.0 37.5*  PLT 120* 84*   BMET  Recent Labs  04/11/14 1925 04/12/14 0307  NA  --  136*  K  --  3.1*  CL  --  98  CO2  --  29  GLUCOSE  --  148*  BUN  --  14  CREATININE 1.40* 1.38*  CALCIUM  --  8.6    COAG Lab Results  Component Value Date   INR 1.02 04/05/2014   INR 0.99 02/14/2014   No results found for this basename: PTT

## 2014-04-12 NOTE — Progress Notes (Signed)
Pt's foley d/c at 0810, post void needed, nursing will cont to monitor

## 2014-04-12 NOTE — Evaluation (Signed)
Physical Therapy Evaluation Patient Details Name: Jeffrey Stout MRN: [id number] DOB: Jul 13, 1951 Today's Date: 04/12/2014   History of Present Illness  Pt is a 63 y.o. male s/p L to R fem-fem BPG, L SFA EA, L fem BPA, R fem EA and BPA.  Clinical Impression  Patient is s/p above surgery resulting in functional limitations due to the deficits listed below (see PT Problem List). Patient will benefit from skilled PT to increase their independence and safety with mobility to allow discharge to the venue listed below. Patient needs to practice stairs next session.       Follow Up Recommendations No PT follow up    Equipment Recommendations  None recommended by PT    Recommendations for Other Services OT consult     Precautions / Restrictions Precautions Precautions: None Restrictions Weight Bearing Restrictions: No      Mobility  Bed Mobility Overal bed mobility: Modified Independent             General bed mobility comments: incr time due to pain; use of handrails and HOB elevated  Transfers Overall transfer level: Needs assistance Equipment used: None Transfers: Sit to/from Stand Sit to Stand: Supervision         General transfer comment: reaching for IV pole for support; cues for hand placement and sequencing  Ambulation/Gait Ambulation/Gait assistance: Min guard Ambulation Distance (Feet): 150 Feet Assistive device:  (IV pole) Gait Pattern/deviations: Step-through pattern;Decreased stride length Gait velocity: guarded due to pain Gait velocity interpretation: Below normal speed for age/gender General Gait Details: pt using IV pole to balance; initially ambulating with RW but wanted to attempt without; no LOB noted but very guarded due to pain  Stairs            Wheelchair Mobility    Modified Rankin (Stroke Patients Only)       Balance Overall balance assessment: Needs assistance Sitting-balance support: Feet supported;No upper extremity  supported Sitting balance-Leahy Scale: Good Sitting balance - Comments: denies any dizziness   Standing balance support: During functional activity;Single extremity supported;No upper extremity supported Standing balance-Leahy Scale: Fair Standing balance comment: able to stand without support for short period of tiem; reaching for UE support intermittently due to pain                             Pertinent Vitals/Pain Pain Assessment: 0-10 Pain Score: 4  Pain Location: surgical site Pain Descriptors / Indicators: Burning Pain Intervention(s): Monitored during session;Repositioned    Home Living Family/patient expects to be discharged to:: Private residence Living Arrangements: Spouse/significant other Available Help at Discharge: Family;Available 24 hours/day Type of Home: House Home Access: Stairs to enter Entrance Stairs-Rails: None Entrance Stairs-Number of Steps: 3 Home Layout: One level Home Equipment: None      Prior Function Level of Independence: Independent         Comments: pt independent with all ADLs; works as a Surveyor, mining at [employer]        Extremity/Trunk Assessment   Upper Extremity Assessment: Defer to OT evaluation           Lower Extremity Assessment: Generalized weakness (due to surgery)      Cervical / Trunk Assessment: Normal  Communication   Communication: No difficulties  Cognition Arousal/Alertness: Awake/alert Behavior During Therapy: WFL for tasks assessed/performed Overall Cognitive Status: Within Functional Limits for tasks assessed  General Comments General comments (skin integrity, edema, etc.): encouraged OOB for meals and ambulating with nurses as tolerated    Exercises        Assessment/Plan    PT Assessment Patient needs continued PT services  PT Diagnosis Difficulty walking;Acute pain;Generalized weakness   PT Problem List Decreased  strength;Decreased activity tolerance;Decreased mobility;Pain  PT Treatment Interventions DME instruction;Gait training;Stair training;Functional mobility training;Therapeutic activities;Therapeutic exercise;Balance training;Neuromuscular re-education;Patient/family education   PT Goals (Current goals can be found in the Care Plan section) Acute Rehab PT Goals Patient Stated Goal: to go home tomorrow or saturday PT Goal Formulation: With patient Time For Goal Achievement: 04/15/14 Potential to Achieve Goals: Good    Frequency Min 4X/week   Barriers to discharge        Co-evaluation               End of Session Equipment Utilized During Treatment: Gait belt Activity Tolerance: Patient tolerated treatment well Patient left: in bed;with call bell/phone within reach;with bed alarm set Nurse Communication: Mobility status         Time: 1131-1146 PT Time Calculation (min): 15 min   Charges:   PT Evaluation $Initial PT Evaluation Tier I: 1 Procedure PT Treatments $Gait Training: 8-22 mins   PT G CodesGustavus Bryant, [place]  [phone] 04/12/2014, 12:00 PM

## 2014-04-13 DIAGNOSIS — I739 Peripheral vascular disease, unspecified: Secondary | ICD-10-CM

## 2014-04-13 MED ORDER — OXYCODONE-ACETAMINOPHEN 5-325 MG PO TABS: 1.0000 | ORAL_TABLET | Freq: Four times a day (QID) | ORAL | Status: DC | PRN

## 2014-04-13 NOTE — Progress Notes (Signed)
Went over discharge instructions with patient and his wife. They have no additional questions or concerns related to discharge instructions. Patient given paper prescription to fill at his pharmacy of choice. IV d/c'd, patient taken off the cardiac monitor. ABI completed. Patient discharged home with wife. Roxan Hockey, RN

## 2014-04-13 NOTE — Progress Notes (Signed)
   Daily Progress Note  Assessment/Planning: POD #2 s/p L to R fem-fem BPG, L SFA EA, L fem BPA, R fem EA and BPA   No rehab needs  Pain controlled  Hemodynamically stable  I reiterated to this patient that his femoral status would worsen if he continues smoking.  I have had multiple patient with early restenosis of endarterectomized femoral arteries with continued smoking.  Ok to D/C  Subjective  - 2 Days Post-Op  No pain  Objective Filed Vitals:   04/12/14 1129 04/12/14 1400 04/12/14 1958 04/13/14 0527  BP: 132/69 129/64 120/58 117/64  Pulse: 77 76 74 77  Temp: 98.7 F (37.1 C) 98.2 F (36.8 C) 98 F (36.7 C) 99 F (37.2 C)  TempSrc: Oral Oral  Oral  Resp: 18 18 18 19   Height:      Weight:      SpO2: 100% 98% 100% 96%    Intake/Output Summary (Last 24 hours) at 04/13/14 0742 Last data filed at 04/12/14 1958  Gross per 24 hour  Intake   1645 ml  Output   1075 ml  Net    570 ml    PULM  CTAB CV  RRR GI  soft, NTND VASC  Easily palpable pulse in right DP today, strong L DP, both groin inc c/d/i, palpable Fem-fem pulse  Laboratory CBC    Component Value Date/Time   WBC 8.4 04/12/2014 0307   HGB 12.7* 04/12/2014 0307   HCT 37.5* 04/12/2014 0307   PLT 84* 04/12/2014 0307    BMET    Component Value Date/Time   NA 136* 04/12/2014 0307   K 3.1* 04/12/2014 0307   CL 98 04/12/2014 0307   CO2 29 04/12/2014 0307   GLUCOSE 148* 04/12/2014 0307   BUN 14 04/12/2014 0307   CREATININE 1.38* 04/12/2014 0307   CALCIUM 8.6 04/12/2014 0307   GFRNONAA 53* 04/12/2014 0307   GFRAA 61* 04/12/2014 0307    Adele Barthel, MD [facility] [place] Office: [phone] Pager: [phone]  04/13/2014, 7:42 AM

## 2014-04-13 NOTE — Progress Notes (Signed)
VASCULAR LAB PRELIMINARY  ARTERIAL  ABI completed:    RIGHT    LEFT    PRESSURE WAVEFORM  PRESSURE WAVEFORM  BRACHIAL 164 triphasic BRACHIAL 160 triphasic  DP   DP    AT 136 triphasic AT 143 monophasic  PT 139 triphasic PT 145 biphasic  PER   PER    GREAT TOE  NA GREAT TOE  NA    RIGHT LEFT  ABI 0.85 0.88     Peachie Barkalow, RVT 04/13/2014, 4:34 PM

## 2014-04-13 NOTE — Care Management Note (Signed)
    Page 1 of 1   04/13/2014     2:25:43 PM CARE MANAGEMENT NOTE 04/13/2014  Patient:  Jeffrey Stout, Jeffrey Stout   Account Number:  [id number]  Date Initiated:  04/12/2014  Documentation initiated by:  Marvetta Gibbons  Subjective/Objective Assessment:   Pt admitted s/p femfem bypass graft     Action/Plan:   PTA pt lived at home with spouse-PT/OT evals pending- NCM to follow for recommendations   Anticipated DC Date:  04/14/2014   Anticipated DC Plan:  [place]  CM consult      Choice offered to / List presented to:             Status of service:  Completed, signed off Medicare Important Message given?  NO (If response is "NO", the following Medicare IM given date fields will be blank) Date Medicare IM given:   Medicare IM given by:   Date Additional Medicare IM given:   Additional Medicare IM given by:    Discharge Disposition:  HOME/SELF CARE  Per UR Regulation:  Reviewed for med. necessity/level of care/duration of stay  If discussed at [place] of Stay Meetings, dates discussed:    Comments:  Medicare IM- N/A  04/13/14 Ellan Lambert, Shelly Bombard, BSN [phone] PT/OT recommending no OP follow up at dc.  Likley dc today with spouse.  No home needs identified.

## 2014-04-13 NOTE — Progress Notes (Signed)
Physical Therapy Treatment Patient Details Name: Rivan Siordia MRN: [id number] DOB: [birth year] Today's Date: 04/13/2014    History of Present Illness Pt is a 63 y.o. male s/p L to R fem-fem BPG, L SFA EA, L fem BPA, R fem EA and BPA.    PT Comments    *Pt walked 350' without assistive device and completed stair training. He's ready to DC home from PT standpoint. PT goals met, PT signing off. **  Follow Up Recommendations  No PT follow up     Equipment Recommendations  None recommended by PT    Recommendations for Other Services       Precautions / Restrictions Precautions Precautions: None Restrictions Weight Bearing Restrictions: No    Mobility  Bed Mobility Overal bed mobility: Modified Independent             General bed mobility comments:  use of handrails   Transfers Overall transfer level: Modified independent Equipment used: None Transfers: Sit to/from Stand Sit to Stand: Modified independent (Device/Increase time)         General transfer comment: pushed up from bedrail  Ambulation/Gait Ambulation/Gait assistance: Independent Ambulation Distance (Feet): 350 Feet Assistive device: None (IV pole) Gait Pattern/deviations: WFL(Within Functional Limits)   Gait velocity interpretation: at or above normal speed for age/gender General Gait Details: pt was steady without AD today   Stairs Stairs: Yes Stairs assistance: Independent Stair Management: No rails;Forwards;Alternating pattern Number of Stairs: 4    Wheelchair Mobility    Modified Rankin (Stroke Patients Only)       Balance Overall balance assessment: Independent                                  Cognition Arousal/Alertness: Awake/alert Behavior During Therapy: WFL for tasks assessed/performed Overall Cognitive Status: Within Functional Limits for tasks assessed                      Exercises      General Comments        Pertinent Vitals/Pain Pain  Assessment: No/denies pain    Home Living                      Prior Function            PT Goals (current goals can now be found in the care plan section) Acute Rehab PT Goals Patient Stated Goal: to go home and recover, return to maintainance work at [employer] Progress towards PT goals: Goals met/education completed, patient discharged from PT    Frequency  Min 4X/week    PT Plan Current plan remains appropriate    Co-evaluation             End of Session Equipment Utilized During Treatment: Gait belt Activity Tolerance: Patient tolerated treatment well Patient left: with call bell/phone within reach;in chair;with family/visitor present     Time: 1128-1140 PT Time Calculation (min): 12 min  Charges:  $Gait Training: 8-22 mins                    G Codes:      Philomena Doheny 04/13/2014, 12:08 PM [phone]

## 2014-04-17 NOTE — Discharge Summary (Signed)
[facility] Discharge Summary   Patient ID:  Jeffrey Stout MRN: [id number] DOB/AGE: 64-Mar-1952 64 y.o.  Admit date: 04/11/2014 Discharge date: 04/17/2014 Date of Surgery: 04/11/2014 Surgeon: Surgeon(s): Conrad Berryville, MD  Admission Diagnosis: Peripheral Arterial Disease  Discharge Diagnoses:  Peripheral Arterial Disease  Secondary Diagnoses: Past Medical History  Diagnosis Date  . Hypertension   . TIA (transient ischemic attack) 04/2013    went to [facility] for evaluation  . Peripheral arterial disease   . Hyperlipidemia   . H/O renal artery stenosis   . GERD (gastroesophageal reflux disease)     Procedure(s): BYPASS GRAFT LEFT-RIGHT FEMORAL-FEMORAL ARTERY and bilateral femoral endarterectomies with Bovine Patch Angioplasties.  Discharged Condition: good  HPI: Jeffrey Stout is a 63 y.o. male who presents for postoperative follow-up from procedure on Date: 02/16/15: L CIA PTA+S, L renal artery PTA+S. The angiogram demonstrated calcified CTO R CIA. His right leg sx remain as previously: numbness in R leg not consistent with his PAD. He denies rest pain or classic intermittent claudication sx. Pt is scheduled for a nuclear stress test as part of his preop evaluation for possible femorofemoral bypass.    Hospital Course:  Jeffrey Stout is a 63 y.o. male is S/P Procedure(s): BYPASS GRAFT LEFT-RIGHT FEMORAL-FEMORAL ARTERY and bilateral femoral endarterectomies with Bovine Patch Angioplasties. He was admitted post operatively.  POD#1 his pain was well controlled.  He was transferred to 2W.  ABI's show right 0.85 and left 0.88.  Dr. Bridgett Larsson reiterated to this patient that his femoral status would worsen if he continues smoking.  POD#2 he was discharged home in stable condition.  Consults:     Significant Diagnostic Studies: CBC Lab Results  Component Value Date   WBC 8.4 04/12/2014   HGB 12.7* 04/12/2014   HCT 37.5* 04/12/2014   MCV 96.9 04/12/2014   PLT 84*  04/12/2014    BMET    Component Value Date/Time   NA 136* 04/12/2014 0307   K 3.1* 04/12/2014 0307   CL 98 04/12/2014 0307   CO2 29 04/12/2014 0307   GLUCOSE 148* 04/12/2014 0307   BUN 14 04/12/2014 0307   CREATININE 1.38* 04/12/2014 0307   CALCIUM 8.6 04/12/2014 0307   GFRNONAA 53* 04/12/2014 0307   GFRAA 61* 04/12/2014 0307   COAG Lab Results  Component Value Date   INR 1.02 04/05/2014   INR 0.99 02/14/2014     Disposition:  Discharge to :Home Discharge Instructions   Call MD for:  redness, tenderness, or signs of infection (pain, swelling, bleeding, redness, odor or green/yellow discharge around incision site)    Complete by:  As directed      Call MD for:  severe or increased pain, loss or decreased feeling  in affected limb(s)    Complete by:  As directed      Call MD for:  temperature >100.5    Complete by:  As directed      Discharge instructions    Complete by:  As directed   You may shower with soap and water daily     Driving Restrictions    Complete by:  As directed   No driving for 2 weeks     Lifting restrictions    Complete by:  As directed   No lifting for 6 weeks     Resume previous diet    Complete by:  As directed             Medication List  amLODipine 10 MG tablet  Commonly known as:  NORVASC  Take 10 mg by mouth daily.     aspirin 81 MG tablet  Take 81 mg by mouth daily.     benazepril 40 MG tablet  Commonly known as:  LOTENSIN  Take 20 mg by mouth daily.     clopidogrel 75 MG tablet  Commonly known as:  PLAVIX  Take 1 tablet (75 mg total) by mouth daily.     hydrochlorothiazide 25 MG tablet  Commonly known as:  HYDRODIURIL  Take 25 mg by mouth daily.     naproxen sodium 220 MG tablet  Commonly known as:  ANAPROX  Take 220 mg by mouth 2 (two) times daily.     oxyCODONE-acetaminophen 5-325 MG per tablet  Commonly known as:  PERCOCET/ROXICET  Take 1 tablet by mouth every 6 (six) hours as needed for moderate pain.      pravastatin 40 MG tablet  Commonly known as:  PRAVACHOL  Take 40 mg by mouth at bedtime.       Verbal and written Discharge instructions given to the patient. Wound care per Discharge AVS   Signed: Laurence Slate [facility] 04/17/2014, 8:04 AM  Addendum  I have independently interviewed and examined the patient, and I agree with the physician assistant's discharge summary.  This patient underwent a left superficial femoral artery endarterectomy, left femoral bovine patch angioplasty, right iliofemoral endarterectomy and bovine patch angioplasty, and left to right femorofemoral bypass.  His operation was uneventful as was his post-operative course.  By discharge, he had regained a pulse in his right foot and continued to have a pulse in his left foot.  The patient will follow up in the office in two weeks for wound check.  I have reiterated to this patient multiple times the importance of smoking cessation to help preserve the luminal gain from the surgery.  Adele Barthel, MD [facility] [place] Office: [phone] Pager: [phone]  04/17/2014, 9:39 AM   - For VQI Registry use --- Instructions: Press F2 to tab through selections.  Delete question if not applicable.   Post-op:  Wound infection: No  Graft infection: No  Transfusion: No  If yes, 0 units given New Arrhythmia: No Ipsilateral amputation: [x ] no, [ ]  Minor, [ ]  BKA, [ ]  AKA Discharge patency: [x ] Primary, [ ]  Primary assisted, [ ]  Secondary, [ ]  Occluded Patency judged by: [ ]  Dopper only, [ ]  Palpable graft pulse, [x ] Palpable distal pulse, [ ]  ABI inc. > 0.15, [ ]  Duplex Discharge ABI: R 0.85, L 0.88 D/C Ambulatory Status: Ambulatory  Complications: MI: [ x] No, [ ]  Troponin only, [ ]  EKG or Clinical CHF: No Resp failure: [x ] none, [ ]  Pneumonia, [ ]  Ventilator Chg in renal function: [x ] none, [ ]  Inc. Cr > 0.5, [ ]  Temp. Dialysis, [ ]  Permanent dialysis Stroke: [x ] None, [ ]  Minor, [ ]   Major Return to OR: No  Reason for return to OR: [ ]  Bleeding, [ ]  Infection, [ ]  Thrombosis, [ ]  Revision  Discharge medications: Statin use:  Yes ASA use:  Yes Plavix use:  Yes Beta blocker use: No  for medical reason   Coumadin use: No  for medical reason

## 2014-04-20 ENCOUNTER — Encounter (HOSPITAL_COMMUNITY): Payer: Self-pay | Admitting: Emergency Medicine

## 2014-04-20 ENCOUNTER — Emergency Department (HOSPITAL_COMMUNITY)
Admission: EM | Admit: 2014-04-20 | Discharge: 2014-04-20 | Disposition: A | Discharge status: Home / Self Care | Payer: BC Managed Care – PPO | Attending: Emergency Medicine | Admitting: Emergency Medicine

## 2014-04-20 ENCOUNTER — Telehealth: Payer: Self-pay | Admitting: *Deleted

## 2014-04-20 DIAGNOSIS — E785 Hyperlipidemia, unspecified: Secondary | ICD-10-CM | POA: Diagnosis not present

## 2014-04-20 DIAGNOSIS — Z8673 Personal history of transient ischemic attack (TIA), and cerebral infarction without residual deficits: Secondary | ICD-10-CM | POA: Insufficient documentation

## 2014-04-20 DIAGNOSIS — Z87448 Personal history of other diseases of urinary system: Secondary | ICD-10-CM | POA: Insufficient documentation

## 2014-04-20 DIAGNOSIS — Y832 Surgical operation with anastomosis, bypass or graft as the cause of abnormal reaction of the patient, or of later complication, without mention of misadventure at the time of the procedure: Secondary | ICD-10-CM | POA: Insufficient documentation

## 2014-04-20 DIAGNOSIS — K219 Gastro-esophageal reflux disease without esophagitis: Secondary | ICD-10-CM | POA: Insufficient documentation

## 2014-04-20 DIAGNOSIS — Z87891 Personal history of nicotine dependence: Secondary | ICD-10-CM | POA: Insufficient documentation

## 2014-04-20 DIAGNOSIS — Z79899 Other long term (current) drug therapy: Secondary | ICD-10-CM | POA: Diagnosis not present

## 2014-04-20 DIAGNOSIS — T8131XA Disruption of external operation (surgical) wound, not elsewhere classified, initial encounter: Secondary | ICD-10-CM

## 2014-04-20 DIAGNOSIS — I10 Essential (primary) hypertension: Secondary | ICD-10-CM | POA: Diagnosis not present

## 2014-04-20 DIAGNOSIS — Z7982 Long term (current) use of aspirin: Secondary | ICD-10-CM | POA: Insufficient documentation

## 2014-04-20 LAB — CBC WITH DIFFERENTIAL/PLATELET
Basophils Absolute: 0 10*3/uL (ref 0.0–0.1)
Basophils Relative: 0 % (ref 0–1)
Eosinophils Absolute: 0.3 10*3/uL (ref 0.0–0.7)
Eosinophils Relative: 3 % (ref 0–5)
HCT: 38.8 % — ABNORMAL LOW (ref 39.0–52.0)
Hemoglobin: 14.3 g/dL (ref 13.0–17.0)
Lymphocytes Relative: 23 % (ref 12–46)
Lymphs Abs: 2.1 10*3/uL (ref 0.7–4.0)
MCH: 33.5 pg (ref 26.0–34.0)
MCHC: 36.9 g/dL — ABNORMAL HIGH (ref 30.0–36.0)
MCV: 90.9 fL (ref 78.0–100.0)
Monocytes Absolute: 0.6 10*3/uL (ref 0.1–1.0)
Monocytes Relative: 7 % (ref 3–12)
Neutro Abs: 6.1 10*3/uL (ref 1.7–7.7)
Neutrophils Relative %: 67 % (ref 43–77)
Platelets: 255 10*3/uL (ref 150–400)
RBC: 4.27 MIL/uL (ref 4.22–5.81)
RDW: 12.4 % (ref 11.5–15.5)
WBC: 9.1 10*3/uL (ref 4.0–10.5)

## 2014-04-20 LAB — COMPREHENSIVE METABOLIC PANEL
ALT: 37 U/L (ref 0–53)
AST: 17 U/L (ref 0–37)
Albumin: 3.7 g/dL (ref 3.5–5.2)
Alkaline Phosphatase: 101 U/L (ref 39–117)
Anion gap: 15 (ref 5–15)
BUN: 20 mg/dL (ref 6–23)
CO2: 24 mEq/L (ref 19–32)
Calcium: 9.2 mg/dL (ref 8.4–10.5)
Chloride: 98 mEq/L (ref 96–112)
Creatinine, Ser: 1.3 mg/dL (ref 0.50–1.35)
GFR calc Af Amer: 65 mL/min — ABNORMAL LOW (ref >90)
GFR calc non Af Amer: 56 mL/min — ABNORMAL LOW (ref >90)
Glucose, Bld: 138 mg/dL — ABNORMAL HIGH (ref 70–99)
Potassium: 3.4 mEq/L — ABNORMAL LOW (ref 3.7–5.3)
Sodium: 137 mEq/L (ref 137–147)
Total Bilirubin: 0.3 mg/dL (ref 0.3–1.2)
Total Protein: 7.4 g/dL (ref 6.0–8.3)

## 2014-04-20 NOTE — Discharge Instructions (Signed)
Wound Dehiscence Wound dehiscence is when a surgical cut (incision) breaks open and does not heal properly after surgery. It usually happens 7-10 days after surgery. This can be a serious condition. It is important to identify and treat this condition early.  CAUSES  Some common causes of wound dehiscence include:  Stretching of the wound area. This may be caused by lifting, vomiting, violent coughing, or straining during bowel movements.  Wound infection.  Early stitch (suture) removal. RISK FACTORS Various things can increase your risk of developing wound dehiscence, including:  Obesity.  Lung disease.  Smoking.  Poor nutrition.  Contamination during surgery. SIGNS AND SYMPTOMS  Bleeding from the wound.  Pain.  Fever.  Wound starts breaking open. DIAGNOSIS  Your health care provider may diagnose wound dehiscence by monitoring the incision and noting any changes in the wound. These changes can include an increase in drainage or pain. The health care provider may also ask you if you have noticed any stretching or tearing of the wound.  Wound cultures may be taken to determine if there is an infection.  Imaging studies, such as an MRI scan or CT scan, may be done to determine if there is a collection of pus or fluid in the wound area. TREATMENT Treatment may include:  Wound care.  Surgical repair.  Antibiotic medicine to treat or prevent infection.  Medicines to reduce pain and swelling. HOME CARE INSTRUCTIONS   Only take over-the-counter or prescription medicines for pain, discomfort, or fever as directed by your health care provider. Taking pain medicine 30 minutes before changing a bandage (dressing) can help relieve pain.  Take your antibiotics as directed. Finish them even if you start to feel better.  Gently wash the area with mild soap and water 2 times a day, or as directed. Rinse off the soap. Pat the area dry with a clean towel. Do not rub the wound.  This may cause bleeding.  Follow your health care provider's instructions for how often you need to change the dressing and packing inside. Wash your hands well before and after changing your dressing. Apply a dressing to the wound as directed.  Take showers. Do not soak the wound, bathe, swim, or use a hot tub until directed by your health care provider.  Avoid exercises that make you sweat heavily.  Use anti-itch medicine as directed by your health care provider. The wound may itch when it is healing. Do not pick or scratch at the wound.  Do not lift more than 10 pounds (4.5 kg) until the wound is healed, or as directed by your health care provider.  Keep all follow-up appointments as directed. SEEK MEDICAL CARE IF:  You have excessive bleeding from your surgical wound.  Your wound does not seem to be healing properly.  You have a fever. SEEK IMMEDIATE MEDICAL CARE IF:   You have increased swelling or redness around the wound.  You have increasing pain in the wound.  You have an increasing amount of pus coming from the wound.  Your wound breaks open farther. MAKE SURE YOU:   Understand these instructions.  Will watch your condition.  Will get help right away if you are not doing well or get worse. Document Released: 10/31/2003 Document Revised: 08/15/2013 Document Reviewed: 04/17/2013 [facility] Patient Information 2015 [place], [place]. This information is not intended to replace advice given to you by your health care provider. Make sure you discuss any questions you have with your health care provider.  Wound  Dehiscence Wound dehiscence is when a surgical cut (incision) breaks open and does not heal properly after surgery. It usually happens 7-10 days after surgery. This can be a serious condition. It is important to identify and treat this condition early.  CAUSES  Some common causes of wound dehiscence include:  Stretching of the wound area. This may be caused by  lifting, vomiting, violent coughing, or straining during bowel movements.  Wound infection.  Early stitch (suture) removal. RISK FACTORS Various things can increase your risk of developing wound dehiscence, including:  Obesity.  Lung disease.  Smoking.  Poor nutrition.  Contamination during surgery. SIGNS AND SYMPTOMS  Bleeding from the wound.  Pain.  Fever.  Wound starts breaking open. DIAGNOSIS  Your health care provider may diagnose wound dehiscence by monitoring the incision and noting any changes in the wound. These changes can include an increase in drainage or pain. The health care provider may also ask you if you have noticed any stretching or tearing of the wound.  Wound cultures may be taken to determine if there is an infection.  Imaging studies, such as an MRI scan or CT scan, may be done to determine if there is a collection of pus or fluid in the wound area. TREATMENT Treatment may include:  Wound care.  Surgical repair.  Antibiotic medicine to treat or prevent infection.  Medicines to reduce pain and swelling. HOME CARE INSTRUCTIONS   Only take over-the-counter or prescription medicines for pain, discomfort, or fever as directed by your health care provider. Taking pain medicine 30 minutes before changing a bandage (dressing) can help relieve pain.  Take your antibiotics as directed. Finish them even if you start to feel better.  Gently wash the area with mild soap and water 2 times a day, or as directed. Rinse off the soap. Pat the area dry with a clean towel. Do not rub the wound. This may cause bleeding.  Follow your health care provider's instructions for how often you need to change the dressing and packing inside. Wash your hands well before and after changing your dressing. Apply a dressing to the wound as directed.  Take showers. Do not soak the wound, bathe, swim, or use a hot tub until directed by your health care provider.  Avoid  exercises that make you sweat heavily.  Use anti-itch medicine as directed by your health care provider. The wound may itch when it is healing. Do not pick or scratch at the wound.  Do not lift more than 10 pounds (4.5 kg) until the wound is healed, or as directed by your health care provider.  Keep all follow-up appointments as directed. SEEK MEDICAL CARE IF:  You have excessive bleeding from your surgical wound.  Your wound does not seem to be healing properly.  You have a fever. SEEK IMMEDIATE MEDICAL CARE IF:   You have increased swelling or redness around the wound.  You have increasing pain in the wound.  You have an increasing amount of pus coming from the wound.  Your wound breaks open farther. MAKE SURE YOU:   Understand these instructions.  Will watch your condition.  Will get help right away if you are not doing well or get worse. Document Released: 10/31/2003 Document Revised: 08/15/2013 Document Reviewed: 04/17/2013 [facility] Patient Information 2015 [place], [place]. This information is not intended to replace advice given to you by your health care provider. Make sure you discuss any questions you have with your health care provider.

## 2014-04-20 NOTE — Telephone Encounter (Signed)
Patient's wife called our triage line today at noon; she did not leave a message just their phone number. [name] was just discharged on 04-17-14 from having a left to right fem-fem bypass graft by Dr. Bridgett Larsson. I tried to call the patient back x 3 but could not leave a message because no voice mail. I notified Dr. Bridgett Larsson that this patient had called in and that I have tried to call him back.  Will continue to call.

## 2014-04-20 NOTE — ED Provider Notes (Signed)
CSN: [id number]     Arrival date & time 04/20/14  1710 History   First MD Initiated Contact with Patient 04/20/14 2201     Chief Complaint  Patient presents with  . Post-op Problem     (Consider location/radiation/quality/duration/timing/severity/associated sxs/prior Treatment) HPI  63yM s/p Left to right femorofemoral bypass 8/19 presenting for wound evaluation. L sided wound "opened up" and pt reports drainage. Reports small amount of drainage with consistency of water. Yellow/blood tinged. Did not notice an odor. Minimal localized pain. No fever or chills. No rash. Reports otherwise has been doing very well since surgery. No acute pain, numbness,tingling in legs.   Past Medical History  Diagnosis Date  . Hypertension   . TIA (transient ischemic attack) 04/2013    went to [facility] for evaluation  . Peripheral arterial disease   . Hyperlipidemia   . H/O renal artery stenosis   . GERD (gastroesophageal reflux disease)    Past Surgical History  Procedure Laterality Date  . Cholecystectomy    . Vascular surgery    . Renal artery stent Left 2015  . Femoral-femoral bypass graft Bilateral 04/11/2014    Procedure: BYPASS GRAFT LEFT-RIGHT FEMORAL-FEMORAL ARTERY and bilateral femoral endarterectomies with Bovine Patch Angioplasties.;  Surgeon: Conrad [place], MD;  Location: [place];  Service: Vascular;  Laterality: Bilateral;   Family History  Problem Relation Age of Onset  . Diabetes Mother   . Hypertension Brother   . Heart attack Brother    History  Substance Use Topics  . Smoking status: Former Smoker -- 1.50 packs/day for 45 years    Quit date: 04/24/2013  . Smokeless tobacco: Never Used  . Alcohol Use: No    Review of Systems  All systems reviewed and negative, other than as noted in HPI.   Allergies  Review of patient's allergies indicates no known allergies.  Home Medications   Prior to Admission medications   Medication Sig Start Date End Date Taking?  Authorizing Provider  amLODipine (NORVASC) 10 MG tablet Take 10 mg by mouth daily.   Yes Historical Provider, MD  aspirin 81 MG tablet Take 81 mg by mouth daily.   Yes Historical Provider, MD  benazepril (LOTENSIN) 40 MG tablet Take 20 mg by mouth daily.    Yes Historical Provider, MD  clopidogrel (PLAVIX) 75 MG tablet Take 1 tablet (75 mg total) by mouth daily. 02/16/14  Yes Alvia Grove, PA-C  hydrochlorothiazide (HYDRODIURIL) 25 MG tablet Take 25 mg by mouth daily.   Yes Historical Provider, MD  naproxen sodium (ANAPROX) 220 MG tablet Take 220 mg by mouth daily as needed. For pain   Yes Historical Provider, MD  omeprazole (PRILOSEC) 20 MG capsule Take 20 mg by mouth daily as needed. For heartburn   Yes Historical Provider, MD  oxyCODONE-acetaminophen (PERCOCET/ROXICET) 5-325 MG per tablet Take 1 tablet by mouth every 6 (six) hours as needed for moderate pain. 04/13/14  Yes Ulyses Amor, PA-C  pravastatin (PRAVACHOL) 40 MG tablet Take 40 mg by mouth at bedtime.    Yes Historical Provider, MD   BP 168/90  Pulse 59  Temp(Src) 97.6 F (36.4 C) (Oral)  Resp 16  Ht 6\' 2"  (1.88 m)  Wt 170 lb (77.111 kg)  BMI 21.82 kg/m2  SpO2 100% Physical Exam  Nursing note and vitals reviewed. Constitutional: He appears well-developed and well-nourished. No distress.  HENT:  Head: Normocephalic and atraumatic.  Eyes: Conjunctivae are normal. Right eye exhibits no discharge. Left eye exhibits  no discharge.  Neck: Neck supple.  Cardiovascular: Normal rate, regular rhythm and normal heart sounds.  Exam reveals no gallop and no friction rub.   No murmur heard. Pulmonary/Chest: Effort normal and breath sounds normal. No respiratory distress.  Abdominal: Soft. He exhibits no distension. There is no tenderness.  Musculoskeletal: He exhibits no edema and no tenderness.       Legs: Neurological: He is alert.  Skin: Skin is warm and dry.  Vertically oriented surgical wounds to b/l groin. Small amount of  tissue adhesive still present in some areas. R appears to be healing w/o complication. Mid/inferior aspect of L one with some skin dehiscence. Granulation tissue with small amount of whitish exudate at base. Gently probed with cotton swab and does not appear to track beyond what I can visualize. No drainage. No surrounding cellulitis. No significant tenderness. Legs/feet appear well perfused. Warm to touch. Both DP pulses palpable, but significantly easier for me to locate on L side. Good plantar/dorsiflexion.   Psychiatric: He has a normal mood and affect. His behavior is normal. Thought content normal.    ED Course  Procedures (including critical care time) Labs Review Labs Reviewed  CBC WITH DIFFERENTIAL - Abnormal; Notable for the following:    HCT 38.8 (*)    MCHC 36.9 (*)    All other components within normal limits  COMPREHENSIVE METABOLIC PANEL - Abnormal; Notable for the following:    Potassium 3.4 (*)    Glucose, Bld 138 (*)    GFR calc non Af Amer 56 (*)    GFR calc Af Amer 65 (*)    All other components within normal limits    Imaging Review No results found.   EKG Interpretation None      MDM   Final diagnoses:  Dehiscence of closure of skin, initial encounter    42yM s/p Left to right femorofemoral bypass,right femoral endarterectomy with bovine patch angioplasty and limited left femoral endarterectomy with bovine patch angioplasty left common femoral artery on 8/19. Skin closure inferior aspect L wound dehisced a little bit. Does not appear secondarily infected. Pt reports has been doing very well since his surgery otherwise. NVI distally. Waist band of boxers/pants rubbing right against this area. Discussed need to keep the area covered/clean as continues to heal. I think a simple gauze dressing at this point is adequate. Additional wound care discussed as well as return precautions. Expect this to continue to heal fine w/o further complication.      Virgel Manifold, MD 04/25/14 [phone]

## 2014-04-20 NOTE — ED Notes (Addendum)
Pt. Reports had a surgery to have tube placed in groin to bypass clot in right leg. States left side incision site has "popped" open and is draining.  Redness and warmth around site noted.

## 2014-04-23 ENCOUNTER — Telehealth: Payer: Self-pay | Admitting: Vascular Surgery

## 2014-04-23 NOTE — Telephone Encounter (Signed)
Message copied by Gena Fray on Mon Apr 23, 2014 10:51 AM ------      Message from: Peter Minium K      Created: Mon Apr 23, 2014  9:16 AM      Regarding: Schedule                   ----- Message -----         From: Conrad Faywood, MD         Sent: 04/22/2014   7:39 PM           To: [facility] [address]            Donivin Wirt      [phone]      07-08-1951            Needs follow up appointment this Thursday. ------

## 2014-04-23 NOTE — Telephone Encounter (Signed)
Spoke with Jeffrey Stout to schedule for this Thursday 09/03 @ 12:45pm, dpm

## 2014-04-26 ENCOUNTER — Ambulatory Visit: Payer: BC Managed Care – PPO | Admitting: Vascular Surgery

## 2014-05-04 ENCOUNTER — Encounter: Payer: BC Managed Care – PPO | Admitting: Vascular Surgery

## 2014-05-10 ENCOUNTER — Encounter: Payer: Self-pay | Admitting: Vascular Surgery

## 2014-05-11 ENCOUNTER — Encounter: Payer: Self-pay | Admitting: Vascular Surgery

## 2014-05-11 ENCOUNTER — Ambulatory Visit (INDEPENDENT_AMBULATORY_CARE_PROVIDER_SITE_OTHER): Payer: Self-pay | Admitting: Vascular Surgery

## 2014-05-11 VITALS — BP 164/96 | HR 76 | Ht 74.0 in | Wt 175.8 lb

## 2014-05-11 DIAGNOSIS — I70219 Atherosclerosis of native arteries of extremities with intermittent claudication, unspecified extremity: Secondary | ICD-10-CM

## 2014-05-11 DIAGNOSIS — I745 Embolism and thrombosis of iliac artery: Secondary | ICD-10-CM

## 2014-05-11 NOTE — Progress Notes (Addendum)
    Postoperative Visit   History of Present Illness  Jeffrey Stout is a 63 y.o. year old male who presents for postoperative follow-up for: L-R fem-fem BPG, R fem EA w/ BPA, L limited EA w/ BPA (Date: 04/11/14).  The patient's wounds are not healed.  The patient notes resolution of lower extremity symptoms.  The patient is able to complete their activities of daily living.  The patient's current symptoms are: drainage L groin.  Pt was supposed to follow up for a L groin dehiscence but he did not due to a death in the family.  He continues to smoke  For VQI Use Only  PRE-ADM LIVING: Home  AMB STATUS: Ambulatory  Physical Examination  Filed Vitals:   05/11/14 0912  BP: 164/96  Pulse: 76   R groin: nearly healed, small eschar superiorly L groin: good granulation to level of skin, re-epitheliazation in process, no drainage, no smell Palpable fem-fem pulse Both feet pink and viable  Medical Decision Making  Jeffrey Stout is a 63 y.o. year old male who presents s/p L-to-R fem-fem BPG, R fem EA w/ BPA, L limited fem EA w/ BPA   The patient's bypass incisions are healing appropriately with resolution of pre-operative symptoms.  I reiterated again the importance of smoking cessation.  It has been my experience that extensive endarterectomies in smoker heal very poorly.  I gave the patient local wound care instructions for both groins. I discussed in depth with the patient the nature of atherosclerosis, and emphasized the importance of maximal medical management including strict control of blood pressure, blood glucose, and lipid levels, obtaining regular exercise, and cessation of smoking.  The patient is aware that without maximal medical management the underlying atherosclerotic disease process will progress, limiting the benefit of any interventions.  The patient is going to follow up in 2 weeks for wound check.  Thank you for allowing [place] to participate in this patient's  care.  Adele Barthel, MD [facility] [place] Office: [phone] Pager: [phone]  05/11/2014, 9:31 AM

## 2014-05-24 ENCOUNTER — Encounter: Payer: Self-pay | Admitting: Vascular Surgery

## 2014-05-25 ENCOUNTER — Ambulatory Visit: Payer: BC Managed Care – PPO | Admitting: Vascular Surgery

## 2014-06-06 ENCOUNTER — Encounter: Payer: Self-pay | Admitting: Vascular Surgery

## 2014-06-07 ENCOUNTER — Encounter: Payer: Self-pay | Admitting: Vascular Surgery

## 2014-06-07 ENCOUNTER — Ambulatory Visit (INDEPENDENT_AMBULATORY_CARE_PROVIDER_SITE_OTHER): Payer: Self-pay | Admitting: Vascular Surgery

## 2014-06-07 VITALS — BP 130/84 | HR 103 | Resp 16 | Ht 74.0 in | Wt 173.0 lb

## 2014-06-07 DIAGNOSIS — I745 Embolism and thrombosis of iliac artery: Secondary | ICD-10-CM

## 2014-06-07 DIAGNOSIS — I150 Renovascular hypertension: Secondary | ICD-10-CM

## 2014-06-07 DIAGNOSIS — I701 Atherosclerosis of renal artery: Secondary | ICD-10-CM

## 2014-06-07 DIAGNOSIS — I70219 Atherosclerosis of native arteries of extremities with intermittent claudication, unspecified extremity: Secondary | ICD-10-CM

## 2014-06-07 NOTE — Addendum Note (Signed)
Addended by: Mena Goes on: 06/07/2014 04:56 PM   Modules accepted: Orders

## 2014-06-07 NOTE — Progress Notes (Signed)
   Postoperative Visit   History of Present Illness  Jeffrey Stout is a 63 y.o. male  who presents for postoperative follow-up for: L-R fem-fem BPG, R fem EA w/ BPA, L limited EA w/ BPA (Date: 04/11/14). Both groins are healed now.  The patient is able to complete their activities of daily living. The patient's current symptoms are: none.  He continues to smoke   For VQI Use Only  PRE-ADM LIVING: Home   AMB STATUS: Ambulatory   Physical Examination  Filed Vitals:   06/07/14 1554  BP: 130/84  Pulse: 103  Resp: 16  Height: 6\' 2"  (1.88 m)  Weight: 173 lb (78.472 kg)   R groin: healed L groin: near healed, some peeling scab superficially  Palpable fem-fem pulse  Both feet pink and viable   Medical Decision Making  Jeffrey Stout is a 62 y.o. male  who presents s/p L-to-R fem-fem BPG, R fem EA w/ BPA, L limited fem EA w/ BPA, s/p L RA PTA+S, L CIA PTA+S The patient's bypass incisions are healing appropriately with resolution of pre-operative symptoms.  I reiterated again the importance of smoking cessation. It has been my experience that extensive endarterectomies in smoker heal very poorly.  I discussed in depth with the patient the nature of atherosclerosis, and emphasized the importance of maximal medical management including strict control of blood pressure, blood glucose, and lipid levels, obtaining regular exercise, and cessation of smoking. The patient is aware that without maximal medical management the underlying atherosclerotic disease process will progress, limiting the benefit of any interventions. The patient is scheduled for BLE ABI, B renal duplex, and aortoiliac duplex in 3 months for surveillance. The patient is currently on a statin: Pravachol. The patient is currently on an anti-platelet: Plavix and ASA.  I discussed with the pt continuing with Plavix and stopping aspirin. Thank you for allowing [place] to participate in this patient's care  Adele Barthel, MD [facility] [place] Office: [phone] Pager: [phone]  06/07/2014, 4:22 PM

## 2014-08-02 ENCOUNTER — Encounter: Payer: Self-pay | Admitting: Vascular Surgery

## 2014-08-02 ENCOUNTER — Encounter (HOSPITAL_COMMUNITY): Payer: Self-pay | Admitting: Vascular Surgery

## 2014-08-03 ENCOUNTER — Ambulatory Visit (HOSPITAL_COMMUNITY)
Admission: RE | Admit: 2014-08-03 | Discharge: 2014-08-03 | Disposition: A | Discharge status: Home / Self Care | Payer: BC Managed Care – PPO | Source: Ambulatory Visit | Attending: Vascular Surgery | Admitting: Vascular Surgery

## 2014-08-03 ENCOUNTER — Ambulatory Visit (INDEPENDENT_AMBULATORY_CARE_PROVIDER_SITE_OTHER)
Admission: RE | Admit: 2014-08-03 | Discharge: 2014-08-03 | Disposition: A | Discharge status: Home / Self Care | Payer: BC Managed Care – PPO | Source: Ambulatory Visit | Attending: Vascular Surgery | Admitting: Vascular Surgery

## 2014-08-03 ENCOUNTER — Encounter: Payer: Self-pay | Admitting: Vascular Surgery

## 2014-08-03 ENCOUNTER — Ambulatory Visit (INDEPENDENT_AMBULATORY_CARE_PROVIDER_SITE_OTHER): Payer: BC Managed Care – PPO | Admitting: Vascular Surgery

## 2014-08-03 VITALS — BP 160/95 | HR 72 | Ht 74.0 in | Wt 177.0 lb

## 2014-08-03 DIAGNOSIS — I70219 Atherosclerosis of native arteries of extremities with intermittent claudication, unspecified extremity: Secondary | ICD-10-CM | POA: Diagnosis not present

## 2014-08-03 DIAGNOSIS — I745 Embolism and thrombosis of iliac artery: Secondary | ICD-10-CM | POA: Insufficient documentation

## 2014-08-03 DIAGNOSIS — I701 Atherosclerosis of renal artery: Secondary | ICD-10-CM

## 2014-08-03 DIAGNOSIS — I150 Renovascular hypertension: Secondary | ICD-10-CM

## 2014-08-03 NOTE — Progress Notes (Signed)
Established Previous Bypass  History of Present Illness  Jeffrey Stout is a 63 y.o. (23-Oct-1950) male who presents with chief complaint: no further intermittent claudication.  Previous operation(s) complete include:  1. R fem-fem BPG, R fem EA w/ BPA, L limited EA w/ BPA (Date: 04/11/14). 2. PTA+S L RA, PTA+S L CIA  The patient's prior sx are completely resolved.  The patient's treatment regimen currently included: maximal medical management.  The patient continues to smoke.  The patient's PMH, PSH, SH, FamHx, Med, and Allergies are unchanged from 06/07/14.  On ROS today: no rest pain, no intermittent claudication  Physical Examination  Filed Vitals:   08/03/14 1144  BP: 160/95  Pulse: 72  Height: 6\' 2"  (1.88 m)  Weight: 177 lb (80.287 kg)  SpO2: 100%   Body mass index is 22.72 kg/(m^2).  Physical Examination  Filed Vitals:   08/03/14 1144  BP: 160/95  Pulse: 72  Height: 6\' 2"  (1.88 m)  Weight: 177 lb (80.287 kg)  SpO2: 100%   Body mass index is 22.72 kg/(m^2).  General: A&O x 3, WDWN  Pulmonary: Sym exp, good air movt, CTAB, no rales, rhonchi, & wheezing  Cardiac: RRR, Nl S1, S2, no Murmurs, rubs or gallops  Vascular: Vessel Right Left  Radial Palpable Palpable  Brachial Palpable Palpable  Carotid Palpable, without bruit Palpable, without bruit  Aorta Not palpable N/A  Femoral Palpable Palpable  Popliteal Not palpable Not palpable  PT Palpable Palpable  DP Palpable Palpable   Gastrointestinal: soft, NTND, -G/R, - HSM, - masses, - CVAT B  Musculoskeletal: M/S 5/5 throughout , Extremities without ischemic changes   Neurologic: Pain and light touch intact in extremities , Motor exam as listed above  Non-Invasive Vascular Imaging ABI (Date: 08/03/2014) R: 1.16 (0.85), DP: tri, PT: tri, TBI: 0.69 L: 1.08 (0.88), DP: tri, PT: tri, TBI: 0.79  Aortoiliac Duplex (Date: 08/03/2014)  Ao: 119 c/s  L CIA stent: PSV 306 c/s  R EIA: PSV 251  c/s  Fem-fem: widely patent despite 335 c/s at inflow anastomosis  B renal duplex (08/03/2014)  R: 54 c/s dampened  R kidney: 6.7 cm  L: 341 c/s in-stent but no evidence of turbulence in stent, suggesting false elevation, L kidney 11.8 cm  Medical Decision Making  Jeffrey Stout is a 63 y.o. male who presents with: s/p s/p L-to-R fem-fem BPG, R fem EA w/ BPA, L limited fem EA w/ BPA, s/p L RA PTA+S, L CIA PTA+S .  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  The patient is aware that without maximal medical management the underlying atherosclerotic disease process will progress, limiting the benefit of any interventions.  I discussed in depth with the patient the need for long term surveillance to improve the primary assisted patency of his bypass.  The patient agrees to cooperate with such.  The patient is scheduled for the previous listed surveillance studies in 3 months and 6 months as described above.  Thank you for allowing [place] to participate in this patient's care.  Adele Barthel, MD [facility] [place] Office: [phone] Pager: [phone]  08/03/2014, 1:33 PM

## 2014-08-03 NOTE — Addendum Note (Signed)
Addended by: Dorthula Rue L on: 08/03/2014 03:58 PM   Modules accepted: Orders

## 2014-09-06 ENCOUNTER — Encounter (HOSPITAL_COMMUNITY): Payer: Self-pay | Admitting: Vascular Surgery

## 2014-09-07 ENCOUNTER — Other Ambulatory Visit (HOSPITAL_COMMUNITY): Payer: BC Managed Care – PPO

## 2014-09-07 ENCOUNTER — Encounter (HOSPITAL_COMMUNITY): Payer: BC Managed Care – PPO

## 2014-09-07 ENCOUNTER — Ambulatory Visit: Payer: BC Managed Care – PPO | Admitting: Vascular Surgery

## 2014-10-25 ENCOUNTER — Encounter: Payer: Self-pay | Admitting: Vascular Surgery

## 2014-10-26 ENCOUNTER — Ambulatory Visit (INDEPENDENT_AMBULATORY_CARE_PROVIDER_SITE_OTHER): Payer: BLUE CROSS/BLUE SHIELD | Admitting: Vascular Surgery

## 2014-10-26 ENCOUNTER — Ambulatory Visit (INDEPENDENT_AMBULATORY_CARE_PROVIDER_SITE_OTHER)
Admission: RE | Admit: 2014-10-26 | Discharge: 2014-10-26 | Disposition: A | Discharge status: Home / Self Care | Payer: BLUE CROSS/BLUE SHIELD | Source: Ambulatory Visit | Attending: Vascular Surgery | Admitting: Vascular Surgery

## 2014-10-26 ENCOUNTER — Encounter: Payer: Self-pay | Admitting: Vascular Surgery

## 2014-10-26 ENCOUNTER — Other Ambulatory Visit (HOSPITAL_COMMUNITY): Payer: BC Managed Care – PPO

## 2014-10-26 ENCOUNTER — Ambulatory Visit (HOSPITAL_COMMUNITY)
Admission: RE | Admit: 2014-10-26 | Discharge: 2014-10-26 | Disposition: A | Discharge status: Home / Self Care | Payer: BLUE CROSS/BLUE SHIELD | Source: Ambulatory Visit | Attending: Vascular Surgery | Admitting: Vascular Surgery

## 2014-10-26 ENCOUNTER — Ambulatory Visit: Payer: BC Managed Care – PPO | Admitting: Vascular Surgery

## 2014-10-26 VITALS — BP 175/89 | HR 54 | Temp 98.2°F | Resp 16 | Ht 73.0 in | Wt 180.0 lb

## 2014-10-26 DIAGNOSIS — E785 Hyperlipidemia, unspecified: Secondary | ICD-10-CM | POA: Insufficient documentation

## 2014-10-26 DIAGNOSIS — I70219 Atherosclerosis of native arteries of extremities with intermittent claudication, unspecified extremity: Secondary | ICD-10-CM

## 2014-10-26 DIAGNOSIS — Z48812 Encounter for surgical aftercare following surgery on the circulatory system: Secondary | ICD-10-CM | POA: Insufficient documentation

## 2014-10-26 DIAGNOSIS — F17200 Nicotine dependence, unspecified, uncomplicated: Secondary | ICD-10-CM | POA: Insufficient documentation

## 2014-10-26 DIAGNOSIS — I10 Essential (primary) hypertension: Secondary | ICD-10-CM | POA: Insufficient documentation

## 2014-10-26 DIAGNOSIS — I701 Atherosclerosis of renal artery: Secondary | ICD-10-CM

## 2014-10-26 NOTE — Progress Notes (Signed)
    Established Previous Bypass  History of Present Illness  Hamid Brookens is a 64 y.o. male  who presents with chief complaint: routine follow-up. Previous operation(s) complete include:  1. L-to-R fem-fem BPG, R fem EA w/ BPA, L limited EA w/ BPA (Date: 04/11/14). 2. PTA+S L RA, PTA+S L CIA (Date: 02/15/14)  The patient's prior sx are completely resolved. The patient's treatment regimen currently included: maximal medical management. The patient continues to smoke.  The patient's PMH, PSH, SH, FamHx, Med, and Allergies are unchanged from 08/03/14  On ROS today: no rest pain, no intermittent claudication, c/o intermittent R mid-abd pain  Physical Examination Filed Vitals:   10/26/14 1007  BP: 175/89  Pulse: 54  Temp: 98.2 F (36.8 C)  TempSrc: Oral  Resp: 16  Height: 6\' 1"  (1.854 m)  Weight: 180 lb (81.647 kg)  SpO2: 99%   Body mass index is 23.75 kg/(m^2).   General: A&O x 3, WDWN, strong odor of cigarettes  Pulmonary: Sym exp, good air movt, CTAB, no rales, rhonchi, & wheezing  Cardiac: RRR, Nl S1, S2, no Murmurs, rubs or gallops  Vascular: Vessel Right Left  Radial Palpable Palpable  Brachial Palpable Palpable  Carotid Palpable, without bruit Palpable, without bruit  Aorta Not palpable N/A  Femoral Palpable Palpable  Popliteal Not palpable Not palpable  PT Palpable Palpable  DP Palpable Palpable   Gastrointestinal: soft, NTND, -G/R, - HSM, - masses, - CVAT B  Musculoskeletal: M/S 5/5 throughout , Extremities without ischemic changes , palpable fem-fem graft at level of pubic bone  Neurologic: Pain and light touch intact in extremities , Motor exam as listed above   Non-Invasive Vascular Imaging ABI (Date: 10/26/2014 )  R: 1.07 (1.16), DP: tri, PT: tri, TBI: 0.85  L: 1.02 (1.08, DP: tri, PT: tri, TBI: 0.63  Aortoiliac Duplex (Date: 10/26/2014 )  Ao: 48 c/s  L CIA stent: PSV 270 c/s (Ratio <2), triphasic flow in stent  R  EIA: PSV 275 c/s  Fem-fem: widely patent    Medical Decision Making  Quince Santana is a 64 y.o. male  who presents with: s/p s/p L-to-R fem-fem BPG, R fem EA w/ BPA, L limited fem EA w/ BPA, s/p L RA PTA+S, L CIA PTA+S .  Based on the patient's vascular studies and examination, I have offered the patient: q6 month BLE ABI, bypass duplex, and aortoiliac duplex, q6 month renal duplex  I discussed in depth with the patient the nature of atherosclerosis, and emphasized the importance of maximal medical management including strict control of blood pressure, blood glucose, and lipid levels, obtaining regular exercise, and cessation of smoking. The patient is aware that without maximal medical management the underlying atherosclerotic disease process will progress, limiting the benefit of any interventions.  I discussed in depth with the patient the need for long term surveillance to improve the primary assisted patency of his bypass. The patient agrees to cooperate with such. The patient is currently on a statin: Pravachol. The patient is currently on an anti-platelet: Plavix.  The patient is scheduled for the previous listed surveillance studies in 6 months as described above.  Thank you for allowing [place] to participate in this patient's care.  Adele Barthel, MD [facility] [place] Office: [phone] Pager: [phone]  10/26/2014, 11:43 AM

## 2014-10-26 NOTE — Addendum Note (Signed)
Addended by: Mena Goes on: 10/26/2014 02:14 PM   Modules accepted: Orders

## 2015-02-11 ENCOUNTER — Other Ambulatory Visit: Payer: Self-pay | Admitting: Vascular Surgery

## 2015-02-12 ENCOUNTER — Encounter: Payer: Self-pay | Admitting: Vascular Surgery

## 2015-02-15 ENCOUNTER — Ambulatory Visit (HOSPITAL_COMMUNITY)
Admission: RE | Admit: 2015-02-15 | Discharge: 2015-02-15 | Disposition: A | Discharge status: Home / Self Care | Payer: BLUE CROSS/BLUE SHIELD | Source: Ambulatory Visit | Attending: Vascular Surgery | Admitting: Vascular Surgery

## 2015-02-15 ENCOUNTER — Ambulatory Visit (INDEPENDENT_AMBULATORY_CARE_PROVIDER_SITE_OTHER): Payer: BLUE CROSS/BLUE SHIELD | Admitting: Vascular Surgery

## 2015-02-15 ENCOUNTER — Encounter: Payer: Self-pay | Admitting: Vascular Surgery

## 2015-02-15 VITALS — BP 157/94 | HR 45 | Resp 16 | Ht 74.0 in | Wt 180.0 lb

## 2015-02-15 DIAGNOSIS — I701 Atherosclerosis of renal artery: Secondary | ICD-10-CM | POA: Diagnosis not present

## 2015-02-15 DIAGNOSIS — Z95820 Peripheral vascular angioplasty status with implants and grafts: Secondary | ICD-10-CM | POA: Insufficient documentation

## 2015-02-15 NOTE — Progress Notes (Signed)
Established Previous Bypass  History of Present Illness  Jeffrey Stout is a 64 y.o. (May 16, 1951) male who presents with chief complaint: routine follow-up.  Previous operation(s) completed include:  1. L-to-R fem-fem BPG, R fem EA w/ BPA, L limited EA w/ BPA (Date: 04/11/14). 2. PTA+S L RA, PTA+S L CIA (Date: 02/15/14)  The patient followed up on 10/26/14 for the leg sx.  He follows up today for surveillance on the left kidney.  He denies any change in bladder habits and his anti-htn regimen is unchanged.  His claudication sx have completely resolved since the fem-fem BPG.  The patient's PMH, PSH, SH, FamHx, Med, and Allergies are unchanged from 10/26/14.  On ROS today: no claudication, no changes in anti-hypertensive regimen  Physical Examination  Filed Vitals:   02/15/15 0928 02/15/15 0936  BP: 158/92 157/94  Pulse: 50 45  Resp: 16   Height: 6\' 2"  (1.88 m)   Weight: 180 lb (81.647 kg)    Body mass index is 23.1 kg/(m^2).  General: A&O x 3, WD, thin  Pulmonary: Sym exp, good air movt, CTAB, no rales, rhonchi, & wheezing  Cardiac: RRR, Nl S1, S2, no Murmurs, rubs or gallops  Vascular: Vessel Right Left  Radial Palpable Palpable  Brachial Palpable Palpable  Carotid Palpable, without bruit Palpable, without bruit  Aorta Not palpable N/A  Femoral Palpable Palpable  Popliteal Not palpable Not palpable  PT Palpable Palpable  DP Palpable Palpable   Gastrointestinal: soft, NTND, -G/R, - HSM, - masses, - CVAT B  Musculoskeletal: M/S 5/5 throughout , Extremities without ischemic changes   Neurologic: Pain and light touch intact in extremities , Motor exam as listed above  Non-Invasive Vascular Imaging  L renal duplex (Date: 02/15/2015)  Difficult study  <60% stenosis  Kidney 12.1 cm  Unable to visualize L RA stent  Medical Decision Making  Jeffrey Stout is a 64 y.o. male who presents with: s/p PTA+S L RA and L CIA, L to R fem-fem BPG   Based on the patient's  vascular studies and examination, I have offered the patient: q6 BLE ABI and aortoiliac duplex (in 3 months).  I will do the renal duplex in 9 months (sync with other studies)  I discussed in depth with the patient the nature of atherosclerosis, and emphasized the importance of maximal medical management including strict control of blood pressure, blood glucose, and lipid levels, obtaining regular exercise, and cessation of smoking.  The patient is aware that without maximal medical management the underlying atherosclerotic disease process will progress, limiting the benefit of any interventions.  I discussed in depth with the patient the need for long term surveillance to improve the primary assisted patency of his bypass.  The patient agrees to cooperate with such.  The patient is scheduled for the previous listed surveillance studies in 3 months.  Thank you for allowing [place] to participate in this patient's care.  Adele Barthel, MD [facility] [place] Office: [phone] Pager: [phone]  02/15/2015, 12:24 PM  VASCULAR QUALITY INITIATIVE FOLLOW UP DATA:  Current smoker: [  ] yes  [ x ] no  Living status: [ x ]  Home  [  ] Nursing home  [  ] Homeless    MEDS:  ASA [  ] yes  [ x ] no- [ x ] medical reason: on Plavix  [  ] non compliant  STATIN  [x  ] yes  [  ] no- [  ] medical  reason  [  ] non compliant  Beta blocker [ x ] yes  [ ]  no- [  ] medical reason  [  ] non compliant  ACE inhibitor [ x ] yes  [  ] no- [  ] medical reason  [  ] non compliant  P2Y12 Antagonist [  ] none  [ x ] clopidogrel-Plavix  [  ] ticlopidine-Ticlid   [  ] prasugrel-Effient  [  ] ticagrelor- Brilinta    Anticoagulant [x ] None  [  ] warfarin  [  ] rivaroxaban-Xarelto [  ] dabigatran- Pradaxa  Ambulation: [ x ] Amb  [  ] Amb with assistance  [  ] wheelchair  [  ] bedridden  Sx RIGHT:  [ x ] none   [  ] claudication  [  ] rest pain  [  ] tissue loss Sx LEFT:  [ x ] none   [  ]  claudication  [  ] rest pain  [  ] tissue loss  Current Patency RIGHT: [ x ] primary  [  ] primary-assisted  [  ] secondary  [  ] occluded Current Patency LEFT:   [ x ] primary  [  ] primary-assisted  [  ] secondary  [  ] occluded  Patency judged by: [x  ] doppler  [ x ] palp graft pulse  [ x ] palp distal pulse   [ x ] ABI increase > 15%   [x ] Duplex  If occluded, when-   ABI: RIGHT: 1.07  LEFT: 1.02  TBI: RIGHT: 0.85  LEFT: 0.63  Infection: [x  ] none  [  ] cellulitis  [  ] deep abscess  [  ] infection of artery or graft  Bypass revision: [ x ] no  [  ] yes- [  ] surg  [  ] catheter based  [  ] both    Date:   Thrombectomy/ lysis/ revision:  [ x ] no  [  ] yes- [  ] surg  [  ] catheter based  [  ] both    Date:   Major amputation:   [x  ] no  [  ] minor amp  [  ] BKA  [  ] AKA   [  ] RIGHT  [  ] LEFT Date:

## 2015-05-02 ENCOUNTER — Encounter: Payer: Self-pay | Admitting: Vascular Surgery

## 2015-05-03 ENCOUNTER — Ambulatory Visit (INDEPENDENT_AMBULATORY_CARE_PROVIDER_SITE_OTHER): Payer: Medicare Other | Admitting: Vascular Surgery

## 2015-05-03 ENCOUNTER — Ambulatory Visit (INDEPENDENT_AMBULATORY_CARE_PROVIDER_SITE_OTHER)
Admission: RE | Admit: 2015-05-03 | Discharge: 2015-05-03 | Disposition: A | Discharge status: Home / Self Care | Payer: Medicare Other | Source: Ambulatory Visit | Attending: Vascular Surgery | Admitting: Vascular Surgery

## 2015-05-03 ENCOUNTER — Ambulatory Visit (HOSPITAL_COMMUNITY)
Admission: RE | Admit: 2015-05-03 | Discharge: 2015-05-03 | Disposition: A | Discharge status: Home / Self Care | Payer: Medicare Other | Source: Ambulatory Visit | Attending: Vascular Surgery | Admitting: Vascular Surgery

## 2015-05-03 ENCOUNTER — Encounter: Payer: Self-pay | Admitting: Vascular Surgery

## 2015-05-03 VITALS — BP 152/76 | HR 76 | Temp 97.6°F | Resp 14 | Ht 74.0 in | Wt 178.0 lb

## 2015-05-03 DIAGNOSIS — Z87891 Personal history of nicotine dependence: Secondary | ICD-10-CM | POA: Diagnosis not present

## 2015-05-03 DIAGNOSIS — I745 Embolism and thrombosis of iliac artery: Secondary | ICD-10-CM

## 2015-05-03 DIAGNOSIS — I70219 Atherosclerosis of native arteries of extremities with intermittent claudication, unspecified extremity: Secondary | ICD-10-CM

## 2015-05-03 DIAGNOSIS — I701 Atherosclerosis of renal artery: Secondary | ICD-10-CM

## 2015-05-03 DIAGNOSIS — R200 Anesthesia of skin: Secondary | ICD-10-CM

## 2015-05-03 DIAGNOSIS — Z48812 Encounter for surgical aftercare following surgery on the circulatory system: Secondary | ICD-10-CM

## 2015-05-03 MED ORDER — CLOPIDOGREL BISULFATE 75 MG PO TABS: 75.0000 mg | ORAL_TABLET | Freq: Every day | ORAL | Status: DC

## 2015-05-03 NOTE — Addendum Note (Signed)
Addended by: Thresa Ross C on: 05/03/2015 04:04 PM   Modules accepted: Orders

## 2015-05-03 NOTE — Progress Notes (Signed)
    Established Previous Bypass  History of Present Illness  Jeffrey Stout is a 64 y.o. ([birth year]) male  who presents with chief complaint: routine follow-up. Previous operation(s) completed include:  1. L-to-R fem-fem BPG, R fem EA w/ BPA, L limited EA w/ BPA (Date: 04/11/14). 2. PTA+S L RA, PTA+S L CIA (Date: 02/15/14)  His claudication and right leg numbness sx have completely resolved since the fem-fem BPG.  He has been able to resume work without difficulty.    The patient's PMH, PSH, SH, FamHx, Med, and Allergies are unchanged from 02/15/15.  On ROS today: no claudication, no changes in anti-hypertensive regimen, bilateral firmness in groins   Physical Examination Filed Vitals:   05/03/15 0949 05/03/15 0956  BP: 151/77 152/76  Pulse: 47 76  Temp: 97.6 F (36.4 C)   TempSrc: Oral   Resp: 14   Height: 6\' 2"  (1.88 m)   Weight: 178 lb (80.74 kg)   SpO2: 100%    Body mass index is 22.84 kg/(m^2).  General: A&O x 3, WD, thin  Pulmonary: Sym exp, good air movt, CTAB, no rales, rhonchi, & wheezing  Cardiac: RRR, Nl S1, S2, no Murmurs, rubs or gallops  Vascular: Vessel Right Left  Radial Palpable Palpable  Brachial Palpable Palpable  Carotid Palpable, without bruit Palpable, without bruit  Aorta Not palpable N/A  Femoral Palpable Palpable  Popliteal Not palpable Not palpable  PT Palpable Palpable  DP Palpable Palpable   Gastrointestinal: soft, NTND, -G/R, - HSM, - masses, - CVAT B  Musculoskeletal: M/S 5/5 throughout , Extremities without ischemic changes, palpable fem-fem graft with some scarring in both groins  Neurologic: Pain and light touch intact in extremities , Motor exam as listed above  Non-Invasive Vascular Imaging  ABI (Date: 05/03/2015)  R: 0.96 (1.07), DP: bi, PT: bi, TBI: 0.75  L: 0.97 (1.02), DP: bi, PT: bi, TBI: 0.70  Aortoiliac duplex (05/03/2015)  Widely patent fem-fem graft  Patent L CIA stent with triphasic  signals  Medical Decision Making  Jeffrey Stout is a 64 y.o. (1950-11-05) male who presents with: s/p PTA+S L RA and L CIA, L to R fem-fem BPG   Stent and bypass graft remain widely patent.  I again reiterated the importance of smoking cessation, as he is likely to reocclude without cessation of such.  Based on the patient's vascular studies and examination, I have offered the patient: q6 BLE ABI, aortoiliac duplex and renal duplex   I discussed in depth with the patient the nature of atherosclerosis, and emphasized the importance of maximal medical management including strict control of blood pressure, blood glucose, and lipid levels, obtaining regular exercise, and cessation of smoking. The patient is aware that without maximal medical management the underlying atherosclerotic disease process will progress, limiting the benefit of any interventions.  I discussed in depth with the patient the need for long term surveillance to improve the primary assisted patency of his bypass. The patient agrees to cooperate with such.  Thank you for allowing [place] to participate in this patient's care.   Adele Barthel, MD [facility] [place] Office: [phone] Pager: [phone]  05/03/2015, 12:31 PM

## 2015-06-10 DIAGNOSIS — Z23 Encounter for immunization: Secondary | ICD-10-CM | POA: Diagnosis not present

## 2015-10-01 ENCOUNTER — Ambulatory Visit: Payer: Medicare Other | Admitting: Family

## 2015-10-01 DIAGNOSIS — I129 Hypertensive chronic kidney disease with stage 1 through stage 4 chronic kidney disease, or unspecified chronic kidney disease: Secondary | ICD-10-CM | POA: Diagnosis not present

## 2015-10-14 DIAGNOSIS — I10 Essential (primary) hypertension: Secondary | ICD-10-CM | POA: Diagnosis not present

## 2015-10-14 DIAGNOSIS — I701 Atherosclerosis of renal artery: Secondary | ICD-10-CM | POA: Diagnosis not present

## 2015-10-14 DIAGNOSIS — E785 Hyperlipidemia, unspecified: Secondary | ICD-10-CM | POA: Diagnosis not present

## 2015-10-14 DIAGNOSIS — I739 Peripheral vascular disease, unspecified: Secondary | ICD-10-CM | POA: Diagnosis not present

## 2015-10-14 DIAGNOSIS — F17210 Nicotine dependence, cigarettes, uncomplicated: Secondary | ICD-10-CM | POA: Diagnosis not present

## 2015-10-16 DIAGNOSIS — E785 Hyperlipidemia, unspecified: Secondary | ICD-10-CM | POA: Diagnosis not present

## 2015-10-16 DIAGNOSIS — I10 Essential (primary) hypertension: Secondary | ICD-10-CM | POA: Diagnosis not present

## 2015-10-28 ENCOUNTER — Encounter: Payer: Self-pay | Admitting: Family

## 2015-10-31 ENCOUNTER — Other Ambulatory Visit: Payer: Self-pay

## 2015-10-31 DIAGNOSIS — Z959 Presence of cardiac and vascular implant and graft, unspecified: Secondary | ICD-10-CM

## 2015-10-31 DIAGNOSIS — I701 Atherosclerosis of renal artery: Secondary | ICD-10-CM

## 2015-11-01 ENCOUNTER — Encounter (HOSPITAL_COMMUNITY): Payer: Medicare Other

## 2015-11-01 ENCOUNTER — Encounter: Payer: Self-pay | Admitting: Family

## 2015-11-01 ENCOUNTER — Ambulatory Visit (INDEPENDENT_AMBULATORY_CARE_PROVIDER_SITE_OTHER)
Admission: RE | Admit: 2015-11-01 | Discharge: 2015-11-01 | Disposition: A | Discharge status: Home / Self Care | Payer: Medicare Other | Source: Ambulatory Visit | Attending: Family | Admitting: Family

## 2015-11-01 ENCOUNTER — Ambulatory Visit (HOSPITAL_COMMUNITY)
Admission: RE | Admit: 2015-11-01 | Discharge: 2015-11-01 | Disposition: A | Discharge status: Home / Self Care | Payer: Medicare Other | Source: Ambulatory Visit | Attending: Family | Admitting: Family

## 2015-11-01 ENCOUNTER — Ambulatory Visit: Payer: Medicare Other | Admitting: Family

## 2015-11-01 ENCOUNTER — Ambulatory Visit (INDEPENDENT_AMBULATORY_CARE_PROVIDER_SITE_OTHER): Payer: Medicare Other | Admitting: Family

## 2015-11-01 VITALS — BP 145/82 | HR 53 | Temp 97.8°F | Resp 14 | Ht 74.0 in | Wt 180.0 lb

## 2015-11-01 DIAGNOSIS — I10 Essential (primary) hypertension: Secondary | ICD-10-CM | POA: Insufficient documentation

## 2015-11-01 DIAGNOSIS — I70219 Atherosclerosis of native arteries of extremities with intermittent claudication, unspecified extremity: Secondary | ICD-10-CM | POA: Insufficient documentation

## 2015-11-01 DIAGNOSIS — R200 Anesthesia of skin: Secondary | ICD-10-CM | POA: Diagnosis not present

## 2015-11-01 DIAGNOSIS — I701 Atherosclerosis of renal artery: Secondary | ICD-10-CM

## 2015-11-01 DIAGNOSIS — Z4889 Encounter for other specified surgical aftercare: Secondary | ICD-10-CM | POA: Diagnosis not present

## 2015-11-01 DIAGNOSIS — Z95828 Presence of other vascular implants and grafts: Secondary | ICD-10-CM | POA: Diagnosis not present

## 2015-11-01 DIAGNOSIS — I129 Hypertensive chronic kidney disease with stage 1 through stage 4 chronic kidney disease, or unspecified chronic kidney disease: Secondary | ICD-10-CM | POA: Diagnosis not present

## 2015-11-01 DIAGNOSIS — Z720 Tobacco use: Secondary | ICD-10-CM | POA: Diagnosis not present

## 2015-11-01 DIAGNOSIS — I70202 Unspecified atherosclerosis of native arteries of extremities, left leg: Secondary | ICD-10-CM | POA: Insufficient documentation

## 2015-11-01 DIAGNOSIS — Z8679 Personal history of other diseases of the circulatory system: Secondary | ICD-10-CM | POA: Diagnosis not present

## 2015-11-01 DIAGNOSIS — N261 Atrophy of kidney (terminal): Secondary | ICD-10-CM | POA: Diagnosis not present

## 2015-11-01 DIAGNOSIS — I779 Disorder of arteries and arterioles, unspecified: Secondary | ICD-10-CM

## 2015-11-01 DIAGNOSIS — I745 Embolism and thrombosis of iliac artery: Secondary | ICD-10-CM | POA: Diagnosis not present

## 2015-11-01 DIAGNOSIS — Z959 Presence of cardiac and vascular implant and graft, unspecified: Secondary | ICD-10-CM

## 2015-11-01 DIAGNOSIS — N183 Chronic kidney disease, stage 3 (moderate): Secondary | ICD-10-CM | POA: Diagnosis not present

## 2015-11-01 DIAGNOSIS — I771 Stricture of artery: Secondary | ICD-10-CM

## 2015-11-01 DIAGNOSIS — Z48812 Encounter for surgical aftercare following surgery on the circulatory system: Secondary | ICD-10-CM

## 2015-11-01 NOTE — Progress Notes (Signed)
VASCULAR [facility]IN SPECIALISTS OF  HISTORY AND PHYSICAL -PAD  History of Present Illness Jeffrey Stout is a 65 y.o. male patient of Dr. Bridgett Larsson who presents with chief complaint: routine follow-up. Previous operation(s) completed include:  1. L-to-R fem-fem BPG, R fem EA w/ BPA, L limited EA w/ BPA (Date: 04/11/14). 2. PTA+S L RA, PTA+S L CIA (Date: 02/15/14)  His claudication and right leg numbness sx have completely resolved since the fem-fem BPG. He has been able to resume work without difficulty.   On ROS today: no claudication, no changes in anti-hypertensive regimen, bilateral firmness in groins. He states his blood pressure is gradually increasing.   He had a TIA in 2014, evaluated at [facility], denies any known subsequent TIA or stroke.   The patient denies New Medical or Surgical History  Pt Diabetic: No Pt smoker: smoker  (1.5 ppd, started at age 89yrs)  Pt meds include: Statin :Yes Betablocker: Yes ASA: No, he stopped taking on his own volition, states no problems with ASA Other anticoagulants/antiplatelets: Plavix  Past Medical History  Diagnosis Date  . Hypertension   . TIA (transient ischemic attack) 04/2013    went to [facility] for evaluation  . Peripheral arterial disease ([place])   . Hyperlipidemia   . H/O renal artery stenosis   . GERD (gastroesophageal reflux disease)   . Renal artery stenosis [facility])     Social History Social History  Substance Use Topics  . Smoking status: Former Smoker -- 1.50 packs/day for 45 years    Quit date: 04/24/2013  . Smokeless tobacco: Never Used  . Alcohol Use: No    Family History Family History  Problem Relation Age of Onset  . Diabetes Mother   . Hypertension Brother   . Heart attack Brother     Past Surgical History  Procedure Laterality Date  . Cholecystectomy    . Vascular surgery    . Renal artery stent Left 2015  . Femoral-femoral bypass graft Bilateral 04/11/2014    Procedure: BYPASS GRAFT  LEFT-RIGHT FEMORAL-FEMORAL ARTERY and bilateral femoral endarterectomies with Bovine Patch Angioplasties.;  Surgeon: Conrad Dayton, MD;  Location: [place];  Service: Vascular;  Laterality: Bilateral;  . Abdominal aortagram N/A 02/15/2014    Procedure: ABDOMINAL Maxcine Ham;  Surgeon: Conrad Olcott, MD;  Location: [facility] CATH LAB;  Service: Cardiovascular;  Laterality: N/A;    No Known Allergies  Current Outpatient Prescriptions  Medication Sig Dispense Refill  . amLODipine (NORVASC) 10 MG tablet Take 10 mg by mouth daily.    . benazepril (LOTENSIN) 40 MG tablet Take 20 mg by mouth daily.     . clopidogrel (PLAVIX) 75 MG tablet Take 1 tablet (75 mg total) by mouth daily. 30 tablet 11  . metoprolol (LOPRESSOR) 50 MG tablet 50 mg 2 (two) times daily.  5  . pravastatin (PRAVACHOL) 40 MG tablet Take 40 mg by mouth at bedtime.     Marland Kitchen aspirin 81 MG tablet Take 81 mg by mouth daily. Reported on 11/01/2015    . hydrochlorothiazide (HYDRODIURIL) 25 MG tablet Take 25 mg by mouth daily. Reported on 11/01/2015    . naproxen sodium (ANAPROX) 220 MG tablet Take 220 mg by mouth daily as needed. Reported on 11/01/2015    . omeprazole (PRILOSEC) 20 MG capsule Take 20 mg by mouth daily as needed. Reported on 11/01/2015     No current facility-administered medications for this visit.    ROS: See HPI for pertinent positives and negatives.  Physical Examination  Filed Vitals:   11/01/15 1032 11/01/15 1036  BP: 162/80 145/82  Pulse: 54 53  Temp: 97.8 F (36.6 C)   TempSrc: Oral   Resp: 14   Height: 6\' 2"  (1.88 m)   Weight: 180 lb (81.647 kg)   SpO2: 100%    Body mass index is 23.1 kg/(m^2).  General: A&O x 3, WD  Pulmonary: Sym exp, adequate air movt, CTAB, respirations are non labored  Cardiac: RRR, Nl S1, S2, no detected murmur  Vascular: Vessel Right Left  Radial Palpable Palpable  Brachial Palpable Palpable  Carotid Palpable, without bruit Palpable, without bruit  Aorta  Not palpable N/A  Femoral Palpable Palpable  Popliteal Not palpable Not palpable  PT Palpable Palpable  DP Palpable Palpable   Gastrointestinal: soft, NTND, -G/R, - HSM, - palpable masses, - CVAT B  Musculoskeletal: M/S 5/5 throughout , Extremities without ischemic changes, palpable fem-fem graft with some scarring in both groins  Neurologic: Pain and light touch intact in extremities , Motor exam as listed above          Non-Invasive Vascular Imaging: DATE: 11/01/2015  Left Renal Artery Duplex: Decreased visualization of the left renal artery and stent due to overlying bowel gas. The left kidney length measurement is WNL. Evidence of a patent left renal artery stent with Doppler velocities suggestive of >60% left proximal renal artery stenosis, based on decreased visualization. No significant change in the maximum velocities of the left renal artery compared to exam of 02/15/15.  Left Iliac Artery Stent and Fem-Fem BPG Duplex: Left CIA stent with no significant stenosis; greater than 50% stenosis at the left distal CIA. Biphasic flow throughout patent left to right fem-fem bypass graft with no significant stenosis.  No significant change in the left CIA or fem-fem bypass graft when compared to the exam of 05/03/15.  ABI (Date: 11/01/2015)  R: 0.94 (0.96, 05/03/15), DP: biphasic, PT: biphasic, TBI: 0.70  L: 0.93 (0.97), DP: biphasic, PT: biphasic, TBI: 0.58    ASSESSMENT: Jeffrey Stout is a 65 y.o. male who is s/p L-to-R fem-fem BPG, R fem EA w/ BPA, L limited EA w/ BPA (Date: 04/11/14) and PTA+S L RA, PTA+S L CIA (Date: 02/15/14). He no longer has claudication sx's with walking. He has no signs of ischemia in his feet/legs. His pedal pulses are palpable.   Left renal artery duplex today suggests a patent left renal artery stent with Doppler velocities suggestive of >60% left proximal renal artery stenosis, based on decreased visualization.No significant  change in the maximum velocities of the left renal artery compared to exam of 02/15/15.  Left iliac artery stent and fem-fem bypass graft duplex today suggests no significant stenosis in the left CIA stent; greater than 50% stenosis at the left distal CIA. Biphasic flow throughout patent left to right fem-fem bypass graft with no significant stenosis.  No significant change in the left CIA or fem-fem bypass graft when compared to the exam of 05/03/15.  ABI's suggest mild bilateral arterial occlusive disease in both lower extremities, all biphasic waveforms; slight decrease in left ABI from normal to mild disease.   Pt states his blood pressure has gradually increased.  Fortunately he does not have DM. Unfortunately he continues to smoke 1.5 ppd, see Plan.   Face to face time with patient was 25 minutes. Over 50% of this time was spent on counseling and coordination of care.   PLAN:  The patient was counseled re smoking cessation and given several  free resources re smoking cessation.   Based on the patient's vascular studies and examination, and after discussing today's duplex results with Dr. Loralyn Freshwater, pt will return to clinic in 6 months with ABI's, left renal artery duplex, and left iliac artery stent duplex.  I discussed in depth with the patient the nature of atherosclerosis, and emphasized the importance of maximal medical management including strict control of blood pressure, blood glucose, and lipid levels, obtaining regular exercise, and cessation of smoking.  The patient is aware that without maximal medical management the underlying atherosclerotic disease process will progress, limiting the benefit of any interventions.  The patient was given information about PAD including signs, symptoms, treatment, what symptoms should prompt the patient to seek immediate medical care, and risk reduction measures to take.  Clemon Chambers, RN, MSN, FNP-C [facility]  [employer] Phone: [phone]  Clinic MD: Bridgett Larsson  11/01/2015 10:49 AM

## 2015-11-01 NOTE — Patient Instructions (Signed)

## 2015-11-04 NOTE — Addendum Note (Signed)
Addended by: Mena Goes on: 11/04/2015 04:47 PM   Modules accepted: Orders

## 2015-11-15 ENCOUNTER — Other Ambulatory Visit: Payer: Self-pay | Admitting: *Deleted

## 2015-11-15 DIAGNOSIS — I739 Peripheral vascular disease, unspecified: Secondary | ICD-10-CM

## 2015-11-15 MED ORDER — CLOPIDOGREL BISULFATE 75 MG PO TABS: 75.0000 mg | ORAL_TABLET | Freq: Every day | ORAL | Status: DC

## 2015-11-15 NOTE — Telephone Encounter (Signed)
Fax received from [facility] for refill on Plavix 75 mg,

## 2015-11-18 ENCOUNTER — Other Ambulatory Visit: Payer: Self-pay | Admitting: *Deleted

## 2016-01-15 DIAGNOSIS — I739 Peripheral vascular disease, unspecified: Secondary | ICD-10-CM | POA: Diagnosis not present

## 2016-01-15 DIAGNOSIS — I701 Atherosclerosis of renal artery: Secondary | ICD-10-CM | POA: Diagnosis not present

## 2016-01-15 DIAGNOSIS — E785 Hyperlipidemia, unspecified: Secondary | ICD-10-CM | POA: Diagnosis not present

## 2016-01-15 DIAGNOSIS — Z0000 Encounter for general adult medical examination without abnormal findings: Secondary | ICD-10-CM | POA: Diagnosis not present

## 2016-01-15 DIAGNOSIS — F17210 Nicotine dependence, cigarettes, uncomplicated: Secondary | ICD-10-CM | POA: Diagnosis not present

## 2016-01-15 DIAGNOSIS — I10 Essential (primary) hypertension: Secondary | ICD-10-CM | POA: Diagnosis not present

## 2016-01-27 ENCOUNTER — Encounter: Payer: Self-pay | Admitting: Cardiology

## 2016-02-06 DIAGNOSIS — N183 Chronic kidney disease, stage 3 (moderate): Secondary | ICD-10-CM | POA: Diagnosis not present

## 2016-02-06 DIAGNOSIS — Z720 Tobacco use: Secondary | ICD-10-CM | POA: Diagnosis not present

## 2016-02-06 DIAGNOSIS — Z8679 Personal history of other diseases of the circulatory system: Secondary | ICD-10-CM | POA: Diagnosis not present

## 2016-02-06 DIAGNOSIS — I129 Hypertensive chronic kidney disease with stage 1 through stage 4 chronic kidney disease, or unspecified chronic kidney disease: Secondary | ICD-10-CM | POA: Diagnosis not present

## 2016-02-06 DIAGNOSIS — N261 Atrophy of kidney (terminal): Secondary | ICD-10-CM | POA: Diagnosis not present

## 2016-05-01 ENCOUNTER — Encounter: Payer: Self-pay | Admitting: Family

## 2016-05-08 ENCOUNTER — Ambulatory Visit (INDEPENDENT_AMBULATORY_CARE_PROVIDER_SITE_OTHER)
Admission: RE | Admit: 2016-05-08 | Discharge: 2016-05-08 | Disposition: A | Discharge status: Home / Self Care | Payer: Medicare Other | Source: Ambulatory Visit | Attending: Family | Admitting: Family

## 2016-05-08 ENCOUNTER — Ambulatory Visit (INDEPENDENT_AMBULATORY_CARE_PROVIDER_SITE_OTHER): Payer: Medicare Other | Admitting: Family

## 2016-05-08 ENCOUNTER — Ambulatory Visit (HOSPITAL_COMMUNITY)
Admission: RE | Admit: 2016-05-08 | Discharge: 2016-05-08 | Disposition: A | Discharge status: Home / Self Care | Payer: Medicare Other | Source: Ambulatory Visit | Attending: Family | Admitting: Family

## 2016-05-08 ENCOUNTER — Encounter: Payer: Self-pay | Admitting: Family

## 2016-05-08 VITALS — BP 141/82 | HR 49 | Resp 16 | Ht 74.0 in | Wt 179.0 lb

## 2016-05-08 DIAGNOSIS — Z959 Presence of cardiac and vascular implant and graft, unspecified: Secondary | ICD-10-CM

## 2016-05-08 DIAGNOSIS — I771 Stricture of artery: Secondary | ICD-10-CM | POA: Insufficient documentation

## 2016-05-08 DIAGNOSIS — F17200 Nicotine dependence, unspecified, uncomplicated: Secondary | ICD-10-CM

## 2016-05-08 DIAGNOSIS — Z4889 Encounter for other specified surgical aftercare: Secondary | ICD-10-CM

## 2016-05-08 DIAGNOSIS — I779 Disorder of arteries and arterioles, unspecified: Secondary | ICD-10-CM

## 2016-05-08 DIAGNOSIS — I701 Atherosclerosis of renal artery: Secondary | ICD-10-CM | POA: Diagnosis not present

## 2016-05-08 DIAGNOSIS — Z720 Tobacco use: Secondary | ICD-10-CM

## 2016-05-08 DIAGNOSIS — Z48812 Encounter for surgical aftercare following surgery on the circulatory system: Secondary | ICD-10-CM

## 2016-05-08 DIAGNOSIS — Z95828 Presence of other vascular implants and grafts: Secondary | ICD-10-CM

## 2016-05-08 NOTE — Progress Notes (Signed)
VASCULAR [facility] VEIN SPECIALISTS OF [place]   CC: Follow up peripheral artery occlusive disease  History of Present Illness Jeffrey Stout is a 65 y.o. male patient of Dr. Bridgett Larsson who presents with chief complaint: routine follow-up. Previous operation(s) completed include:  1. L-to-R fem-fem BPG, R fem EA w/ BPA, L limited EA w/ BPA (Date: 04/11/14). 2. PTA+S L RA, PTA+S L CIA (Date: 02/15/14)  His claudication and right leg numbness sx had completely resolved since the fem-fem BPG. However, he started having tired feeling in his right thigh and calf with walking up an incline since his last visit, walking on level surface he has no claudication.  On ROS today:  no changes in anti-hypertensive regimen.  He had possible a TIA in 2014 along with elevated blood pressure, per pt, evaluated at [facility], denies any known subsequent TIA or stroke.   The patient denies New Medical or Surgical History  Pt Diabetic: No Pt smoker: smoker  (1.5 ppd, started at age 55yrs)  Pt meds include: Statin :Yes Betablocker: Yes ASA: yes Other anticoagulants/antiplatelets: Plavix     Past Medical History:  Diagnosis Date  . GERD (gastroesophageal reflux disease)   . H/O renal artery stenosis   . Hyperlipidemia   . Hypertension   . Peripheral arterial disease ([place])   . Renal artery stenosis ([place])   . TIA (transient ischemic attack) 04/2013   went to [facility] for evaluation    Social History Social History  Substance Use Topics  . Smoking status: Former Smoker    Packs/day: 1.50    Years: 45.00    Quit date: 04/24/2013  . Smokeless tobacco: Never Used  . Alcohol use No    Family History Family History  Problem Relation Age of Onset  . Diabetes Mother   . Hypertension Brother   . Heart attack Brother     Past Surgical History:  Procedure Laterality Date  . ABDOMINAL AORTAGRAM N/A 02/15/2014   Procedure: ABDOMINAL Maxcine Ham;  Surgeon: Conrad Greensburg, MD;  Location: [facility] CATH LAB;   Service: Cardiovascular;  Laterality: N/A;  . CHOLECYSTECTOMY    . FEMORAL-FEMORAL BYPASS GRAFT Bilateral 04/11/2014   Procedure: BYPASS GRAFT LEFT-RIGHT FEMORAL-FEMORAL ARTERY and bilateral femoral endarterectomies with Bovine Patch Angioplasties.;  Surgeon: Conrad Concepcion, MD;  Location: [place];  Service: Vascular;  Laterality: Bilateral;  . RENAL ARTERY STENT Left 2015  . VASCULAR SURGERY      No Known Allergies  Current Outpatient Prescriptions  Medication Sig Dispense Refill  . amLODipine (NORVASC) 10 MG tablet Take 10 mg by mouth daily.    Marland Kitchen aspirin 81 MG tablet Take 81 mg by mouth daily. Reported on 11/01/2015    . benazepril (LOTENSIN) 40 MG tablet Take 20 mg by mouth daily.     . clopidogrel (PLAVIX) 75 MG tablet Take 1 tablet (75 mg total) by mouth daily. 30 tablet 11  . hydrochlorothiazide (HYDRODIURIL) 25 MG tablet Take 25 mg by mouth daily. Reported on 11/01/2015    . metoprolol (LOPRESSOR) 50 MG tablet 50 mg 2 (two) times daily.  5  . pravastatin (PRAVACHOL) 40 MG tablet Take 40 mg by mouth at bedtime.     . naproxen sodium (ANAPROX) 220 MG tablet Take 220 mg by mouth daily as needed. Reported on 11/01/2015    . omeprazole (PRILOSEC) 20 MG capsule Take 20 mg by mouth daily as needed. Reported on 11/01/2015     No current facility-administered medications for this visit.  ROS: See HPI for pertinent positives and negatives.   Physical Examination  Vitals:   05/08/16 0959  BP: (!) 141/82  Pulse: (!) 49  Resp: 16  SpO2: 99%  Weight: 179 lb (81.2 kg)  Height: 6\' 2"  (1.88 m)   Body mass index is 22.98 kg/m.  General: A&O x 3, WD  Pulmonary: Sym exp, adequate air movt, CTAB, respirations are non labored  Cardiac: Regular rhythm, bradycardic rate (on beta blocker), no detected murmur  Vascular: Vessel Right Left  Radial Palpable Palpable  Brachial Palpable Palpable  Carotid Palpable, without bruit Palpable, without bruit  Aorta Not  palpable N/A  Femoral Palpable Palpable  Popliteal Not palpable Not palpable  PT Not Palpable Not Palpable  DP Palpable Not Palpable   Gastrointestinal: soft, NTND, -G/R, - HSM, - palpable masses, - CVAT B  Musculoskeletal: M/S 5/5 throughout , Extremities without ischemic changes, palpable fem-fem graft with some scarring in both groins  Neurologic: Pain and light touch intact in extremities , Motor exam as listed above     ASSESSMENT: Jeffrey Stout is a 65 y.o. male who is s/p L-to-R fem-fem BPG, R fem EA w/ BPA, L limited EA w/ BPA (Date: 04/11/14) and PTA+S L RA, PTA+S L CIA (Date: 02/15/14). Mild claudication symptoms in his right thigh and calf returned since his last visit . He has no signs of ischemia in his feet/legs. His pedal pulses are palpable.   He sees [facility] for Stage 3 CKD (review of records from [facility] dated 02/06/16)  DATA Left renal artery duplex today suggests a patent left renal artery stent with Doppler velocities suggestive of >60% left proximal and mid (348 cm/s) renal artery stenosis. Increase in velocity since previous study on 11/01/15.  Left iliac artery stent and fem-fem bypass graft duplex today suggests no significant stenosis in the left CIA stent; greater than 50% stenosis at the left distal CIA (500 cm/s). Monophasic flow throughout patent left to right fem-fem bypass graft with no significant stenosis.  Increase in the distal CIA/proximal external iliac artery when compared to the previous exam on 11/01/15.  ABI's suggest mild (0.81) right LE arterial occlusive disease with mono and triphasic waveforms, moderate (0.76) left LE arterial occlusive disease with monophasic waveforms. Significant decrease in bilateral ABI's compared to 11/01/15: R: 0.94; L: 0.93  His blood pressure remains stable.   Fortunately he does not have DM. Unfortunately he continues to smoke 1.5 ppd, see Plan.    PLAN:  The patient was  counseled re smoking cessation and given several free resources re smoking cessation. Continue graduated walking program.  Based on the patient's vascular studies and examination, and after discussing with Dr. Donzetta Matters, pt will return to clinic in 4 months with left renal artery duplex, left aortoiliac duplex, and ABI's, follow up with Dr. Bridgett Larsson.  I advised pt to notify us if he develops worsening claudication of non healing wounds.   I discussed in depth with the patient the nature of atherosclerosis, and emphasized the importance of maximal medical management including strict control of blood pressure, blood glucose, and lipid levels, obtaining regular exercise, and cessation of smoking.  The patient is aware that without maximal medical management the underlying atherosclerotic disease process will progress, limiting the benefit of any interventions.  The patient was given information about PAD including signs, symptoms, treatment, what symptoms should prompt the patient to seek immediate medical care, and risk reduction measures to take.  Vinnie Level Nickel, RN, MSN, FNP-C [facility] [employer] Phone: [phone]  Clinic MD: Donzetta Matters  05/08/16 10:11 AM

## 2016-05-08 NOTE — Patient Instructions (Signed)
Peripheral Vascular Disease Peripheral vascular disease (PVD) is a disease of the blood vessels that are not part of your heart and brain. A simple term for PVD is poor circulation. In most cases, PVD narrows the blood vessels that carry blood from your heart to the rest of your body. This can result in a decreased supply of blood to your arms, legs, and internal organs, like your stomach or kidneys. However, it most often affects a person's lower legs and feet. There are two types of PVD.  Organic PVD. This is the more common type. It is caused by damage to the structure of blood vessels.  Functional PVD. This is caused by conditions that make blood vessels contract and tighten (spasm). Without treatment, PVD tends to get worse over time. PVD can also lead to acute ischemic limb. This is when an arm or limb suddenly has trouble getting enough blood. This is a medical emergency. CAUSES Each type of PVD has many different causes. The most common cause of PVD is buildup of a fatty material (plaque) inside of your arteries (atherosclerosis). Small amounts of plaque can break off from the walls of the blood vessels and become lodged in a smaller artery. This blocks blood flow and can cause acute ischemic limb. Other common causes of PVD include:  Blood clots that form inside of blood vessels.  Injuries to blood vessels.  Diseases that cause inflammation of blood vessels or cause blood vessel spasms.  Health behaviors and health history that increase your risk of developing PVD. RISK FACTORS  You may have a greater risk of PVD if you:  Have a family history of PVD.  Have certain medical conditions, including:  High cholesterol.  Diabetes.  High blood pressure (hypertension).  Coronary heart disease.  Past problems with blood clots.  Past injury, such as burns or a broken bone. These may have damaged blood vessels in your limbs.  Buerger disease. This is caused by inflamed blood  vessels in your hands and feet.  Some forms of arthritis.  Rare birth defects that affect the arteries in your legs.  Use tobacco.  Do not get enough exercise.  Are obese.  Are age 50 or older. SIGNS AND SYMPTOMS  PVD may cause many different symptoms. Your symptoms depend on what part of your body is not getting enough blood. Some common signs and symptoms include:  Cramps in your lower legs. This may be a symptom of poor leg circulation (claudication).  Pain and weakness in your legs while you are physically active that goes away when you rest (intermittent claudication).  Leg pain when at rest.  Leg numbness, tingling, or weakness.  Coldness in a leg or foot, especially when compared with the other leg.  Skin or hair changes. These can include:  Hair loss.  Shiny skin.  Pale or bluish skin.  Thick toenails.  Inability to get or maintain an erection (erectile dysfunction). People with PVD are more prone to developing ulcers and sores on their toes, feet, or legs. These may take longer than normal to heal. DIAGNOSIS Your health care provider may diagnose PVD from your signs and symptoms. The health care provider will also do a physical exam. You may have tests to find out what is causing your PVD and determine its severity. Tests may include:  Blood pressure recordings from your arms and legs and measurements of the strength of your pulses (pulse volume recordings).  Imaging studies using sound waves to take pictures of   the blood flow through your blood vessels (Doppler ultrasound).  Injecting a dye into your blood vessels before having imaging studies using:  X-rays (angiogram or arteriogram).  Computer-generated X-rays (CT angiogram).  A powerful electromagnetic field and a computer (magnetic resonance angiogram or MRA). TREATMENT Treatment for PVD depends on the cause of your condition and the severity of your symptoms. It also depends on your age. Underlying  causes need to be treated and controlled. These include long-lasting (chronic) conditions, such as diabetes, high cholesterol, and high blood pressure. You may need to first try making lifestyle changes and taking medicines. Surgery may be needed if these do not work. Lifestyle changes may include:  Quitting smoking.  Exercising regularly.  Following a low-fat, low-cholesterol diet. Medicines may include:  Blood thinners to prevent blood clots.  Medicines to improve blood flow.  Medicines to improve your blood cholesterol levels. Surgical procedures may include:  A procedure that uses an inflated balloon to open a blocked artery and improve blood flow (angioplasty).  A procedure to put in a tube (stent) to keep a blocked artery open (stent implant).  Surgery to reroute blood flow around a blocked artery (peripheral bypass surgery).  Surgery to remove dead tissue from an infected wound on the affected limb.  Amputation. This is surgical removal of the affected limb. This may be necessary in cases of acute ischemic limb that are not improved through medical or surgical treatments. HOME CARE INSTRUCTIONS  Take medicines only as directed by your health care provider.  Do not use any tobacco products, including cigarettes, chewing tobacco, or electronic cigarettes. If you need help quitting, ask your health care provider.  Lose weight if you are overweight, and maintain a healthy weight as directed by your health care provider.  Eat a diet that is low in fat and cholesterol. If you need help, ask your health care provider.  Exercise regularly. Ask your health care provider to suggest some good activities for you.  Use compression stockings or other mechanical devices as directed by your health care provider.  Take good care of your feet.  Wear comfortable shoes that fit well.  Check your feet often for any cuts or sores. SEEK MEDICAL CARE IF:  You have cramps in your legs  while walking.  You have leg pain when you are at rest.  You have coldness in a leg or foot.  Your skin changes.  You have erectile dysfunction.  You have cuts or sores on your feet that are not healing. SEEK IMMEDIATE MEDICAL CARE IF:  Your arm or leg turns cold and blue.  Your arms or legs become red, warm, swollen, painful, or numb.  You have chest pain or trouble breathing.  You suddenly have weakness in your face, arm, or leg.  You become very confused or lose the ability to speak.  You suddenly have a very bad headache or lose your vision.   This information is not intended to replace advice given to you by your health care provider. Make sure you discuss any questions you have with your health care provider.   Document Released: 09/17/2004 Document Revised: 08/31/2014 Document Reviewed: 01/18/2014 Elsevier Interactive Patient Education 2016 Elsevier Inc.     Steps to Quit Smoking  Smoking tobacco can be harmful to your health and can affect almost every organ in your body. Smoking puts you, and those around you, at risk for developing many serious chronic diseases. Quitting smoking is difficult, but it is one of   the best things that you can do for your health. It is never too late to quit. WHAT ARE THE BENEFITS OF QUITTING SMOKING? When you quit smoking, you lower your risk of developing serious diseases and conditions, such as:  Lung cancer or lung disease, such as COPD.  Heart disease.  Stroke.  Heart attack.  Infertility.  Osteoporosis and bone fractures. Additionally, symptoms such as coughing, wheezing, and shortness of breath may get better when you quit. You may also find that you get sick less often because your body is stronger at fighting off colds and infections. If you are pregnant, quitting smoking can help to reduce your chances of having a baby of low birth weight. HOW DO I GET READY TO QUIT? When you decide to quit smoking, create a plan to  make sure that you are successful. Before you quit:  Pick a date to quit. Set a date within the next two weeks to give you time to prepare.  Write down the reasons why you are quitting. Keep this list in places where you will see it often, such as on your bathroom mirror or in your car or wallet.  Identify the people, places, things, and activities that make you want to smoke (triggers) and avoid them. Make sure to take these actions:  Throw away all cigarettes at home, at work, and in your car.  Throw away smoking accessories, such as ashtrays and lighters.  Clean your car and make sure to empty the ashtray.  Clean your home, including curtains and carpets.  Tell your family, friends, and coworkers that you are quitting. Support from your loved ones can make quitting easier.  Talk with your health care provider about your options for quitting smoking.  Find out what treatment options are covered by your health insurance. WHAT STRATEGIES CAN I USE TO QUIT SMOKING?  Talk with your healthcare provider about different strategies to quit smoking. Some strategies include:  Quitting smoking altogether instead of gradually lessening how much you smoke over a period of time. Research shows that quitting "cold turkey" is more successful than gradually quitting.  Attending in-person counseling to help you build problem-solving skills. You are more likely to have success in quitting if you attend several counseling sessions. Even short sessions of 10 minutes can be effective.  Finding resources and support systems that can help you to quit smoking and remain smoke-free after you quit. These resources are most helpful when you use them often. They can include:  Online chats with a counselor.  Telephone quitlines.  Printed self-help materials.  Support groups or group counseling.  Text messaging programs.  Mobile phone applications.  Taking medicines to help you quit smoking. (If you are  pregnant or breastfeeding, talk with your health care provider first.) Some medicines contain nicotine and some do not. Both types of medicines help with cravings, but the medicines that include nicotine help to relieve withdrawal symptoms. Your health care provider may recommend:  Nicotine patches, gum, or lozenges.  Nicotine inhalers or sprays.  Non-nicotine medicine that is taken by mouth. Talk with your health care provider about combining strategies, such as taking medicines while you are also receiving in-person counseling. Using these two strategies together makes you more likely to succeed in quitting than if you used either strategy on its own. If you are pregnant or breastfeeding, talk with your health care provider about finding counseling or other support strategies to quit smoking. Do not take medicine to help you   quit smoking unless told to do so by your health care provider. WHAT THINGS CAN I DO TO MAKE IT EASIER TO QUIT? Quitting smoking might feel overwhelming at first, but there is a lot that you can do to make it easier. Take these important actions:  Reach out to your family and friends and ask that they support and encourage you during this time. Call telephone quitlines, reach out to support groups, or work with a counselor for support.  Ask people who smoke to avoid smoking around you.  Avoid places that trigger you to smoke, such as bars, parties, or smoke-break areas at work.  Spend time around people who do not smoke.  Lessen stress in your life, because stress can be a smoking trigger for some people. To lessen stress, try:  Exercising regularly.  Deep-breathing exercises.  Yoga.  Meditating.  Performing a body scan. This involves closing your eyes, scanning your body from head to toe, and noticing which parts of your body are particularly tense. Purposefully relax the muscles in those areas.  Download or purchase mobile phone or tablet apps (applications)  that can help you stick to your quit plan by providing reminders, tips, and encouragement. There are many free apps, such as QuitGuide from the CDC (Centers for Disease Control and Prevention). You can find other support for quitting smoking (smoking cessation) through smokefree.gov and other websites. HOW WILL I FEEL WHEN I QUIT SMOKING? Within the first 24 hours of quitting smoking, you may start to feel some withdrawal symptoms. These symptoms are usually most noticeable 2-3 days after quitting, but they usually do not last beyond 2-3 weeks. Changes or symptoms that you might experience include:  Mood swings.  Restlessness, anxiety, or irritation.  Difficulty concentrating.  Dizziness.  Strong cravings for sugary foods in addition to nicotine.  Mild weight gain.  Constipation.  Nausea.  Coughing or a sore throat.  Changes in how your medicines work in your body.  A depressed mood.  Difficulty sleeping (insomnia). After the first 2-3 weeks of quitting, you may start to notice more positive results, such as:  Improved sense of smell and taste.  Decreased coughing and sore throat.  Slower heart rate.  Lower blood pressure.  Clearer skin.  The ability to breathe more easily.  Fewer sick days. Quitting smoking is very challenging for most people. Do not get discouraged if you are not successful the first time. Some people need to make many attempts to quit before they achieve long-term success. Do your best to stick to your quit plan, and talk with your health care provider if you have any questions or concerns.   This information is not intended to replace advice given to you by your health care provider. Make sure you discuss any questions you have with your health care provider.   Document Released: 08/04/2001 Document Revised: 12/25/2014 Document Reviewed: 12/25/2014 Elsevier Interactive Patient Education 2016 Elsevier Inc.  

## 2016-05-11 ENCOUNTER — Other Ambulatory Visit: Payer: Self-pay | Admitting: *Deleted

## 2016-05-11 DIAGNOSIS — I739 Peripheral vascular disease, unspecified: Secondary | ICD-10-CM

## 2016-05-11 MED ORDER — CLOPIDOGREL BISULFATE 75 MG PO TABS: 75.0000 mg | ORAL_TABLET | Freq: Every day | ORAL | 11 refills | Status: DC

## 2016-05-11 NOTE — Telephone Encounter (Signed)
Refill requested by Fax from [id number]  [phone] Fax [phone]

## 2016-05-12 ENCOUNTER — Other Ambulatory Visit: Payer: Self-pay | Admitting: *Deleted

## 2016-05-12 DIAGNOSIS — I739 Peripheral vascular disease, unspecified: Secondary | ICD-10-CM

## 2016-05-12 MED ORDER — CLOPIDOGREL BISULFATE 75 MG PO TABS
75.0000 mg | ORAL_TABLET | Freq: Every day | ORAL | 8 refills | Status: DC
Start: 2016-05-12 — End: 2017-01-01

## 2016-07-06 DIAGNOSIS — F17210 Nicotine dependence, cigarettes, uncomplicated: Secondary | ICD-10-CM | POA: Diagnosis not present

## 2016-07-06 DIAGNOSIS — E785 Hyperlipidemia, unspecified: Secondary | ICD-10-CM | POA: Diagnosis not present

## 2016-07-06 DIAGNOSIS — I701 Atherosclerosis of renal artery: Secondary | ICD-10-CM | POA: Diagnosis not present

## 2016-07-06 DIAGNOSIS — I739 Peripheral vascular disease, unspecified: Secondary | ICD-10-CM | POA: Diagnosis not present

## 2016-07-06 DIAGNOSIS — I10 Essential (primary) hypertension: Secondary | ICD-10-CM | POA: Diagnosis not present

## 2016-07-29 NOTE — Addendum Note (Signed)
Addended by: Lianne Cure A on: 07/29/2016 03:39 PM   Modules accepted: Orders

## 2016-09-08 ENCOUNTER — Encounter: Payer: Self-pay | Admitting: Vascular Surgery

## 2016-09-10 ENCOUNTER — Telehealth: Payer: Self-pay | Admitting: Vascular Surgery

## 2016-09-10 NOTE — Telephone Encounter (Signed)
Patient's wife called to reschedule her husband's appt scheduled for 09/11/16 w/ Dr.Chen and 3 vascular lab studies. They cannot come on 09/11/16 due to the inclement weather so we rescheduled to 11/06/16 at 8am. awt

## 2016-09-11 ENCOUNTER — Ambulatory Visit (HOSPITAL_COMMUNITY): Payer: Medicare Other

## 2016-09-11 ENCOUNTER — Ambulatory Visit: Payer: Medicare Other | Admitting: Vascular Surgery

## 2016-10-15 DIAGNOSIS — Z720 Tobacco use: Secondary | ICD-10-CM | POA: Diagnosis not present

## 2016-10-15 DIAGNOSIS — I129 Hypertensive chronic kidney disease with stage 1 through stage 4 chronic kidney disease, or unspecified chronic kidney disease: Secondary | ICD-10-CM | POA: Diagnosis not present

## 2016-10-15 DIAGNOSIS — N183 Chronic kidney disease, stage 3 (moderate): Secondary | ICD-10-CM | POA: Diagnosis not present

## 2016-10-15 DIAGNOSIS — N261 Atrophy of kidney (terminal): Secondary | ICD-10-CM | POA: Diagnosis not present

## 2016-10-15 DIAGNOSIS — Z8679 Personal history of other diseases of the circulatory system: Secondary | ICD-10-CM | POA: Diagnosis not present

## 2016-10-30 ENCOUNTER — Encounter: Payer: Self-pay | Admitting: Vascular Surgery

## 2016-11-04 NOTE — Progress Notes (Addendum)
Established Previous Bypass   History of Present Illness  Jeffrey Stout is a 66 y.o. ([birth year]) male who presents with chief complaint: some right hip pain with walking inclines.  Previous operation(s) include:   1. L-to-R fem-fem BPG, R fem EA w/ BPA, L limited EA w/ BPA (Date: 04/11/14). 2. PTA+S L RA, PTA+S L CIA (Date: 02/15/14)  The patient's symptoms have no progressed.  The patient's symptoms are: right hip pain radiating down thigh with walking inclines. The patient's treatment regimen currently included: maximal medical management.    Past Medical History:  Diagnosis Date  . GERD (gastroesophageal reflux disease)   . H/O renal artery stenosis   . Hyperlipidemia   . Hypertension   . Peripheral arterial disease ([place])   . Renal artery stenosis ([place])   . TIA (transient ischemic attack) 04/2013   went to [facility] for evaluation    Past Surgical History:  Procedure Laterality Date  . ABDOMINAL AORTAGRAM N/A 02/15/2014   Procedure: ABDOMINAL Maxcine Ham;  Surgeon: Conrad [place], MD;  Location: [facility] CATH LAB;  Service: Cardiovascular;  Laterality: N/A;  . CHOLECYSTECTOMY    . FEMORAL-FEMORAL BYPASS GRAFT Bilateral 04/11/2014   Procedure: BYPASS GRAFT LEFT-RIGHT FEMORAL-FEMORAL ARTERY and bilateral femoral endarterectomies with Bovine Patch Angioplasties.;  Surgeon: Conrad Beulaville, MD;  Location: [facility];  Service: Vascular;  Laterality: Bilateral;  . RENAL ARTERY STENT Left 2015  . VASCULAR SURGERY      Social History   Social History  . Marital status: Married    Spouse name: N/A  . Number of children: N/A  . Years of education: N/A   Occupational History  . Not on file.   Social History Main Topics  . Smoking status: Former Smoker    Packs/day: 1.50    Years: 45.00    Quit date: 04/24/2013  . Smokeless tobacco: Never Used  . Alcohol use No  . Drug use: No  . Sexual activity: Not on file   Other Topics Concern  . Not on file   Social History Narrative  . No  narrative on file    Family History  Problem Relation Age of Onset  . Diabetes Mother   . Hypertension Brother   . Heart attack Brother     Current Outpatient Prescriptions  Medication Sig Dispense Refill  . amLODipine (NORVASC) 10 MG tablet Take 10 mg by mouth daily.    Marland Kitchen aspirin 81 MG tablet Take 81 mg by mouth daily. Reported on 11/01/2015    . benazepril (LOTENSIN) 40 MG tablet Take 20 mg by mouth daily.     . clopidogrel (PLAVIX) 75 MG tablet Take 1 tablet (75 mg total) by mouth daily. 30 tablet 8  . hydrochlorothiazide (HYDRODIURIL) 25 MG tablet Take 25 mg by mouth daily. Reported on 11/01/2015    . metoprolol (LOPRESSOR) 50 MG tablet 50 mg 2 (two) times daily.  5  . naproxen sodium (ANAPROX) 220 MG tablet Take 220 mg by mouth daily as needed. Reported on 11/01/2015    . omeprazole (PRILOSEC) 20 MG capsule Take 20 mg by mouth daily as needed. Reported on 11/01/2015    . pravastatin (PRAVACHOL) 40 MG tablet Take 40 mg by mouth at bedtime.      No current facility-administered medications for this visit.      No Known Allergies   REVIEW OF SYSTEMS:  (Positives checked otherwise negative)  CARDIOVASCULAR:   [ ]  chest pain,  [ ]  chest pressure,  [ ]   palpitations,  [ ]  shortness of breath when laying flat,  [ ]  shortness of breath with exertion,   [x]  pain in feet when walking,  [ ]  pain in feet when laying flat, [ ]  history of blood clot in veins (DVT),  [ ]  history of phlebitis,  [ ]  swelling in legs,  [ ]  varicose veins  PULMONARY:   [ ]  productive cough,  [ ]  asthma,  [ ]  wheezing  NEUROLOGIC:   [ ]  weakness in arms or legs,  [ ]  numbness in arms or legs,  [ ]  difficulty speaking or slurred speech,  [ ]  temporary loss of vision in one eye,  [ ]  dizziness  HEMATOLOGIC:   [ ]  bleeding problems,  [ ]  problems with blood clotting too easily  MUSCULOSKEL:   [ ]  joint pain, [ ]  joint swelling  GASTROINTEST:   [ ]  vomiting blood,  [ ]  blood in stool      GENITOURINARY:   [ ]  burning with urination,  [ ]  blood in urine  PSYCHIATRIC:   [ ]  history of major depression  INTEGUMENTARY:   [ ]  rashes,  [ ]  ulcers  CONSTITUTIONAL:   [ ]  fever,  [ ]  chills   Physical Examination  Vitals:   11/06/16 0920 11/06/16 0921  BP: (!) 144/86 137/86  Pulse: (!) 57   Resp: 20   Temp: 97.3 F (36.3 C)   TempSrc: Oral   SpO2: 99%   Weight: 185 lb (83.9 kg)   Height: 6\' 2"  (1.88 m)     Body mass index is 23.75 kg/m.  General: Alert, O x 3, WD,NAD  ENT: erythema in posterior Oropharynx without exudate, +cerv LAD  Pulmonary: Sym exp, good B air movt,CTA B  Cardiac: RRR, Nl S1, S2, no Murmurs, No rubs, No S3,S4  Vascular: Vessel Right Left  Radial Palpable Palpable  Brachial Palpable Palpable  Carotid Palpable, No Bruit Palpable, No Bruit  Aorta Not palpable N/A  Femoral Palpable Palpable  Popliteal Not palpable Not palpable  PT Palpable Palpable  DP Palpable Palpable   Gastrointestinal: soft, non-distended, non-tender to palpation, No guarding or rebound, no HSM, no masses, no CVAT B, No palpable prominent aortic pulse,    Musculoskeletal: M/S 5/5 throughout  , Extremities without ischemic changes  , No edema present,  , No LDS present  Neurologic: CN 2-12 intact , Pain and light touch intact in extremities , Motor exam as listed above   Non-Invasive Vascular Imaging ABI (Date: 11/04/2016)  R:   ABI: 0.76 (0.81),   DP: bi  PT: bi  TBI:  0.59  L:   ABI: 0.81 (0.76),   DP: bi  PT: bi  TBI: 0.56  L Aortoiliac Duplex (Date: 11/04/2016)  Ao: 50 c/s  Stent: 119-152 c/s  Distal to stent: 464 c/s   Renal Duplex (11/06/2016)  Renal origin: 283 c/s  RA: 328-339 c/s  No mention of stent  Medical Decision Making  Jeffrey Stout is a 66 y.o. male who presents with: s/p L to R fem-fem BPG, R fem EA w/ BPA, limit L EA w/ BPA for claudication, L CIA PTA+S for high grade L CIA stenosis, L RA PTA+S for  renovascular HTN, chronic kidney disease stage 3    Given drop in ABI and high grade stenosis implied by aortoiliac duplex, I recommend: aortogram, possible left iliac artery intervention.  Pt has developed CKD stage 3, so admission for pre-hydration is recommended to try to limit  any CIN.   Procedure will be done primarily with CO2 contrast.  I discussed in depth with the patient the nature of atherosclerosis, and emphasized the importance of maximal medical management including strict control of blood pressure, blood glucose, and lipid levels, obtaining regular exercise, and cessation of smoking.    The patient is aware that without maximal medical management the underlying atherosclerotic disease process will progress, limiting the benefit of any interventions.  I discussed in depth with the patient the need for long term surveillance to improve the primary assisted patency of his bypass.  The patient agrees to cooperate with such. The patient is currently on a statin: Pravachol.  The patient is currently on an anti-platelet: ASA.  Thank you for allowing [place] to participate in this patient's care.   Adele Barthel, MD, FACS [facility] Office: [phone] Pager: [phone]

## 2016-11-06 ENCOUNTER — Ambulatory Visit (INDEPENDENT_AMBULATORY_CARE_PROVIDER_SITE_OTHER)
Admission: RE | Admit: 2016-11-06 | Discharge: 2016-11-06 | Disposition: A | Discharge status: Home / Self Care | Payer: Medicare Other | Source: Ambulatory Visit | Attending: Family | Admitting: Family

## 2016-11-06 ENCOUNTER — Ambulatory Visit (HOSPITAL_COMMUNITY)
Admission: RE | Admit: 2016-11-06 | Discharge: 2016-11-06 | Disposition: A | Discharge status: Home / Self Care | Payer: Medicare Other | Source: Ambulatory Visit | Attending: Vascular Surgery | Admitting: Vascular Surgery

## 2016-11-06 ENCOUNTER — Ambulatory Visit (INDEPENDENT_AMBULATORY_CARE_PROVIDER_SITE_OTHER): Payer: Medicare Other | Admitting: Vascular Surgery

## 2016-11-06 ENCOUNTER — Encounter: Payer: Self-pay | Admitting: Vascular Surgery

## 2016-11-06 VITALS — BP 137/86 | HR 57 | Temp 97.3°F | Resp 20 | Ht 74.0 in | Wt 185.0 lb

## 2016-11-06 DIAGNOSIS — I745 Embolism and thrombosis of iliac artery: Secondary | ICD-10-CM | POA: Diagnosis not present

## 2016-11-06 DIAGNOSIS — F17200 Nicotine dependence, unspecified, uncomplicated: Secondary | ICD-10-CM

## 2016-11-06 DIAGNOSIS — I739 Peripheral vascular disease, unspecified: Secondary | ICD-10-CM | POA: Diagnosis not present

## 2016-11-06 DIAGNOSIS — Z959 Presence of cardiac and vascular implant and graft, unspecified: Secondary | ICD-10-CM

## 2016-11-06 DIAGNOSIS — Z95828 Presence of other vascular implants and grafts: Secondary | ICD-10-CM

## 2016-11-06 DIAGNOSIS — I708 Atherosclerosis of other arteries: Secondary | ICD-10-CM | POA: Diagnosis not present

## 2016-11-06 DIAGNOSIS — I70211 Atherosclerosis of native arteries of extremities with intermittent claudication, right leg: Secondary | ICD-10-CM

## 2016-11-06 DIAGNOSIS — I701 Atherosclerosis of renal artery: Secondary | ICD-10-CM

## 2016-11-06 DIAGNOSIS — I779 Disorder of arteries and arterioles, unspecified: Secondary | ICD-10-CM | POA: Diagnosis not present

## 2016-11-06 DIAGNOSIS — I771 Stricture of artery: Secondary | ICD-10-CM

## 2016-11-11 ENCOUNTER — Inpatient Hospital Stay (HOSPITAL_COMMUNITY)
Admission: AD | Admit: 2016-11-11 | Discharge: 2016-11-13 | DRG: 253 | Disposition: A | Discharge status: Home / Self Care | Payer: Medicare Other | Source: Ambulatory Visit | Attending: Vascular Surgery | Admitting: Vascular Surgery

## 2016-11-11 ENCOUNTER — Encounter (HOSPITAL_COMMUNITY): Payer: Self-pay | Admitting: Family

## 2016-11-11 DIAGNOSIS — E785 Hyperlipidemia, unspecified: Secondary | ICD-10-CM | POA: Diagnosis present

## 2016-11-11 DIAGNOSIS — Z7982 Long term (current) use of aspirin: Secondary | ICD-10-CM

## 2016-11-11 DIAGNOSIS — Z8673 Personal history of transient ischemic attack (TIA), and cerebral infarction without residual deficits: Secondary | ICD-10-CM

## 2016-11-11 DIAGNOSIS — I739 Peripheral vascular disease, unspecified: Secondary | ICD-10-CM | POA: Diagnosis present

## 2016-11-11 DIAGNOSIS — Z87891 Personal history of nicotine dependence: Secondary | ICD-10-CM | POA: Diagnosis not present

## 2016-11-11 DIAGNOSIS — K219 Gastro-esophageal reflux disease without esophagitis: Secondary | ICD-10-CM | POA: Diagnosis present

## 2016-11-11 DIAGNOSIS — I701 Atherosclerosis of renal artery: Secondary | ICD-10-CM | POA: Diagnosis not present

## 2016-11-11 DIAGNOSIS — I70211 Atherosclerosis of native arteries of extremities with intermittent claudication, right leg: Secondary | ICD-10-CM | POA: Diagnosis not present

## 2016-11-11 DIAGNOSIS — I70202 Unspecified atherosclerosis of native arteries of extremities, left leg: Secondary | ICD-10-CM | POA: Diagnosis present

## 2016-11-11 DIAGNOSIS — Z9049 Acquired absence of other specified parts of digestive tract: Secondary | ICD-10-CM | POA: Diagnosis not present

## 2016-11-11 DIAGNOSIS — I129 Hypertensive chronic kidney disease with stage 1 through stage 4 chronic kidney disease, or unspecified chronic kidney disease: Secondary | ICD-10-CM | POA: Diagnosis present

## 2016-11-11 DIAGNOSIS — N183 Chronic kidney disease, stage 3 (moderate): Secondary | ICD-10-CM | POA: Diagnosis present

## 2016-11-11 DIAGNOSIS — T82856A Stenosis of peripheral vascular stent, initial encounter: Secondary | ICD-10-CM | POA: Diagnosis present

## 2016-11-11 DIAGNOSIS — Z7902 Long term (current) use of antithrombotics/antiplatelets: Secondary | ICD-10-CM

## 2016-11-11 LAB — CBC
HEMATOCRIT: 48.9 % (ref 39.0–52.0)
Hemoglobin: 17.6 g/dL — ABNORMAL HIGH (ref 13.0–17.0)
MCH: 33.3 pg (ref 26.0–34.0)
MCHC: 36 g/dL (ref 30.0–36.0)
MCV: 92.6 fL (ref 78.0–100.0)
Platelets: 180 10*3/uL (ref 150–400)
RBC: 5.28 MIL/uL (ref 4.22–5.81)
RDW: 13.4 % (ref 11.5–15.5)
WBC: 6.9 10*3/uL (ref 4.0–10.5)

## 2016-11-11 LAB — COMPREHENSIVE METABOLIC PANEL
ALBUMIN: 3.9 g/dL (ref 3.5–5.0)
ALK PHOS: 73 U/L (ref 38–126)
ALT: 41 U/L (ref 17–63)
ANION GAP: 7 (ref 5–15)
AST: 24 U/L (ref 15–41)
BILIRUBIN TOTAL: 0.8 mg/dL (ref 0.3–1.2)
BUN: 11 mg/dL (ref 6–20)
CALCIUM: 9.7 mg/dL (ref 8.9–10.3)
CO2: 27 mmol/L (ref 22–32)
Chloride: 105 mmol/L (ref 101–111)
Creatinine, Ser: 1.5 mg/dL — ABNORMAL HIGH (ref 0.61–1.24)
GFR calc non Af Amer: 47 mL/min — ABNORMAL LOW (ref >60)
GFR, EST AFRICAN AMERICAN: 54 mL/min — AB (ref >60)
Glucose, Bld: 86 mg/dL (ref 65–99)
POTASSIUM: 4.2 mmol/L (ref 3.5–5.1)
SODIUM: 139 mmol/L (ref 135–145)
TOTAL PROTEIN: 7.5 g/dL (ref 6.5–8.1)

## 2016-11-11 LAB — PROTIME-INR
INR: 0.99
Prothrombin Time: 13.1 seconds (ref 11.4–15.2)

## 2016-11-11 MED ORDER — ONDANSETRON HCL 4 MG/2ML IJ SOLN: 4.0000 mg | Freq: Four times a day (QID) | INTRAMUSCULAR | Status: DC | PRN

## 2016-11-11 MED ORDER — PANTOPRAZOLE SODIUM 40 MG PO TBEC
40.0000 mg | DELAYED_RELEASE_TABLET | Freq: Every day | ORAL | Status: DC
  Administered 2016-11-11 – 2016-11-13 (×2): 40 mg via ORAL
  Filled 2016-11-11 (×2): qty 1

## 2016-11-11 MED ORDER — PNEUMOCOCCAL VAC POLYVALENT 25 MCG/0.5ML IJ INJ: 0.5000 mL | INJECTION | INTRAMUSCULAR | Status: DC

## 2016-11-11 MED ORDER — ALUM & MAG HYDROXIDE-SIMETH 200-200-20 MG/5ML PO SUSP: 15.0000 mL | ORAL | Status: DC | PRN

## 2016-11-11 MED ORDER — LABETALOL HCL 5 MG/ML IV SOLN: 10.0000 mg | INTRAVENOUS | Status: DC | PRN

## 2016-11-11 MED ORDER — PHENOL 1.4 % MT LIQD
1.0000 | OROMUCOSAL | Status: DC | PRN
Start: 2016-11-11 — End: 2016-11-13

## 2016-11-11 MED ORDER — SODIUM CHLORIDE 0.9 % IV SOLN
INTRAVENOUS | Status: DC
  Administered 2016-11-11 – 2016-11-12 (×2): via INTRAVENOUS

## 2016-11-11 MED ORDER — ENOXAPARIN SODIUM 40 MG/0.4ML ~~LOC~~ SOLN
40.0000 mg | SUBCUTANEOUS | Status: DC
Start: 2016-11-11 — End: 2016-11-12
  Administered 2016-11-11: 40 mg via SUBCUTANEOUS
  Filled 2016-11-11: qty 0.4

## 2016-11-11 MED ORDER — ACETAMINOPHEN 325 MG PO TABS: 325.0000 mg | ORAL_TABLET | ORAL | Status: DC | PRN

## 2016-11-11 MED ORDER — AMLODIPINE BESYLATE 10 MG PO TABS
10.0000 mg | ORAL_TABLET | Freq: Two times a day (BID) | ORAL | Status: DC
  Administered 2016-11-11 – 2016-11-13 (×4): 10 mg via ORAL
  Filled 2016-11-11 (×4): qty 1

## 2016-11-11 MED ORDER — POTASSIUM CHLORIDE CRYS ER 20 MEQ PO TBCR: 20.0000 meq | EXTENDED_RELEASE_TABLET | Freq: Once | ORAL | Status: DC

## 2016-11-11 MED ORDER — GUAIFENESIN-DM 100-10 MG/5ML PO SYRP: 15.0000 mL | ORAL_SOLUTION | ORAL | Status: DC | PRN

## 2016-11-11 MED ORDER — CLOPIDOGREL BISULFATE 75 MG PO TABS
75.0000 mg | ORAL_TABLET | Freq: Every day | ORAL | Status: DC
  Administered 2016-11-13: 75 mg via ORAL
  Filled 2016-11-11: qty 1

## 2016-11-11 MED ORDER — ASPIRIN 81 MG PO CHEW
81.0000 mg | CHEWABLE_TABLET | Freq: Every day | ORAL | Status: DC
  Administered 2016-11-13: 81 mg via ORAL
  Filled 2016-11-11: qty 1

## 2016-11-11 MED ORDER — METOPROLOL TARTRATE 50 MG PO TABS
50.0000 mg | ORAL_TABLET | Freq: Two times a day (BID) | ORAL | Status: DC
  Administered 2016-11-12 – 2016-11-13 (×2): 50 mg via ORAL
  Filled 2016-11-11 (×3): qty 1

## 2016-11-11 MED ORDER — HYDRALAZINE HCL 20 MG/ML IJ SOLN: 5.0000 mg | INTRAMUSCULAR | Status: DC | PRN

## 2016-11-11 MED ORDER — OXYCODONE-ACETAMINOPHEN 5-325 MG PO TABS: 1.0000 | ORAL_TABLET | ORAL | Status: DC | PRN

## 2016-11-11 MED ORDER — MORPHINE SULFATE (PF) 2 MG/ML IV SOLN: 2.0000 mg | INTRAVENOUS | Status: DC | PRN

## 2016-11-11 MED ORDER — PRAVASTATIN SODIUM 40 MG PO TABS
40.0000 mg | ORAL_TABLET | Freq: Every day | ORAL | Status: DC
  Administered 2016-11-11 – 2016-11-12 (×2): 40 mg via ORAL
  Filled 2016-11-11 (×2): qty 1

## 2016-11-11 MED ORDER — METOPROLOL TARTRATE 5 MG/5ML IV SOLN
2.0000 mg | INTRAVENOUS | Status: DC | PRN
Start: 2016-11-11 — End: 2016-11-13

## 2016-11-11 MED ORDER — ACETAMINOPHEN 325 MG RE SUPP
325.0000 mg | RECTAL | Status: DC | PRN
Start: 2016-11-11 — End: 2016-11-13

## 2016-11-11 NOTE — Progress Notes (Signed)
Pt received from admitting. Oriented to room and equipment. Telemetry applied, CCMD notified. VSS. Call bell within reach. Will continue to monitor.   Fritz Pickerel, RN

## 2016-11-12 ENCOUNTER — Encounter (HOSPITAL_COMMUNITY)
Admission: AD | Disposition: A | Discharge status: Home / Self Care | Payer: Self-pay | Source: Ambulatory Visit | Attending: Vascular Surgery

## 2016-11-12 ENCOUNTER — Encounter (HOSPITAL_COMMUNITY): Payer: Self-pay | Admitting: Vascular Surgery

## 2016-11-12 DIAGNOSIS — I701 Atherosclerosis of renal artery: Secondary | ICD-10-CM

## 2016-11-12 DIAGNOSIS — I70211 Atherosclerosis of native arteries of extremities with intermittent claudication, right leg: Secondary | ICD-10-CM

## 2016-11-12 HISTORY — PX: ABDOMINAL AORTOGRAM W/LOWER EXTREMITY: CATH118223

## 2016-11-12 HISTORY — PX: PERIPHERAL VASCULAR INTERVENTION: CATH118257

## 2016-11-12 LAB — BASIC METABOLIC PANEL
Anion gap: 11 (ref 5–15)
BUN: 13 mg/dL (ref 6–20)
CO2: 23 mmol/L (ref 22–32)
Calcium: 8.7 mg/dL — ABNORMAL LOW (ref 8.9–10.3)
Chloride: 103 mmol/L (ref 101–111)
Creatinine, Ser: 1.4 mg/dL — ABNORMAL HIGH (ref 0.61–1.24)
GFR calc Af Amer: 59 mL/min — ABNORMAL LOW (ref >60)
GFR calc non Af Amer: 51 mL/min — ABNORMAL LOW (ref >60)
GLUCOSE: 107 mg/dL — AB (ref 65–99)
Potassium: 3.6 mmol/L (ref 3.5–5.1)
Sodium: 137 mmol/L (ref 135–145)

## 2016-11-12 LAB — CBC
HCT: 47.7 % (ref 39.0–52.0)
HEMATOCRIT: 44.2 % (ref 39.0–52.0)
Hemoglobin: 15.1 g/dL (ref 13.0–17.0)
Hemoglobin: 16.7 g/dL (ref 13.0–17.0)
MCH: 31.8 pg (ref 26.0–34.0)
MCH: 32.6 pg (ref 26.0–34.0)
MCHC: 34.2 g/dL (ref 30.0–36.0)
MCHC: 35 g/dL (ref 30.0–36.0)
MCV: 93.1 fL (ref 78.0–100.0)
MCV: 93 fL (ref 78.0–100.0)
PLATELETS: 132 10*3/uL — AB (ref 150–400)
Platelets: 145 10*3/uL — ABNORMAL LOW (ref 150–400)
RBC: 4.75 MIL/uL (ref 4.22–5.81)
RBC: 5.13 MIL/uL (ref 4.22–5.81)
RDW: 13.4 % (ref 11.5–15.5)
RDW: 13.4 % (ref 11.5–15.5)
WBC: 6.2 10*3/uL (ref 4.0–10.5)
WBC: 5.2 10*3/uL (ref 4.0–10.5)

## 2016-11-12 LAB — POCT ACTIVATED CLOTTING TIME
Activated Clotting Time: 230 seconds
Activated Clotting Time: 230 seconds
Activated Clotting Time: 175 seconds

## 2016-11-12 LAB — CREATININE, SERUM
Creatinine, Ser: 1.28 mg/dL — ABNORMAL HIGH (ref 0.61–1.24)
GFR calc Af Amer: >60 mL/min (ref >60)
GFR calc non Af Amer: 57 mL/min — ABNORMAL LOW (ref >60)

## 2016-11-12 SURGERY — ABDOMINAL AORTOGRAM W/LOWER EXTREMITY
Anesthesia: LOCAL

## 2016-11-12 MED ORDER — HEPARIN (PORCINE) IN NACL 2-0.9 UNIT/ML-% IJ SOLN
INTRAMUSCULAR | Status: AC
  Filled 2016-11-12: qty 1000

## 2016-11-12 MED ORDER — SODIUM CHLORIDE 0.9 % IV SOLN: 1.0000 mL/kg/h | INTRAVENOUS | Status: AC

## 2016-11-12 MED ORDER — FENTANYL CITRATE (PF) 100 MCG/2ML IJ SOLN
INTRAMUSCULAR | Status: AC
  Filled 2016-11-12: qty 2

## 2016-11-12 MED ORDER — LIDOCAINE HCL (PF) 1 % IJ SOLN
INTRAMUSCULAR | Status: AC
  Filled 2016-11-12: qty 30

## 2016-11-12 MED ORDER — LIDOCAINE HCL (PF) 1 % IJ SOLN
INTRAMUSCULAR | Status: DC | PRN
  Administered 2016-11-12: 20 mL via INTRADERMAL

## 2016-11-12 MED ORDER — HEPARIN SODIUM (PORCINE) 1000 UNIT/ML IJ SOLN
INTRAMUSCULAR | Status: DC | PRN
  Administered 2016-11-12: 8500 [IU] via INTRAVENOUS
  Administered 2016-11-12: 1000 [IU] via INTRAVENOUS

## 2016-11-12 MED ORDER — ACETAMINOPHEN 325 MG PO TABS: 650.0000 mg | ORAL_TABLET | ORAL | Status: DC | PRN

## 2016-11-12 MED ORDER — MIDAZOLAM HCL 2 MG/2ML IJ SOLN
INTRAMUSCULAR | Status: AC
  Filled 2016-11-12: qty 2

## 2016-11-12 MED ORDER — ENOXAPARIN SODIUM 40 MG/0.4ML ~~LOC~~ SOLN: 40.0000 mg | SUBCUTANEOUS | Status: DC

## 2016-11-12 MED ORDER — HEPARIN (PORCINE) IN NACL 2-0.9 UNIT/ML-% IJ SOLN
INTRAMUSCULAR | Status: DC | PRN
  Administered 2016-11-12: 1000 mL

## 2016-11-12 MED ORDER — ONDANSETRON HCL 4 MG/2ML IJ SOLN: 4.0000 mg | Freq: Four times a day (QID) | INTRAMUSCULAR | Status: DC | PRN

## 2016-11-12 MED ORDER — IODIXANOL 320 MG/ML IV SOLN
INTRAVENOUS | Status: DC | PRN
  Administered 2016-11-12: 50 mL via INTRA_ARTERIAL

## 2016-11-12 MED ORDER — FENTANYL CITRATE (PF) 100 MCG/2ML IJ SOLN
INTRAMUSCULAR | Status: DC | PRN
  Administered 2016-11-12: 50 ug via INTRAVENOUS

## 2016-11-12 MED ORDER — MIDAZOLAM HCL 2 MG/2ML IJ SOLN
INTRAMUSCULAR | Status: DC | PRN
  Administered 2016-11-12 (×2): 1 mg via INTRAVENOUS

## 2016-11-12 SURGICAL SUPPLY — 22 items
BALLN ARMADA 8X80X80 (BALLOONS) ×3
BALLN LUTONIX AV 10X40X75 (BALLOONS) ×3
BALLOON ARMADA 8X80X80 (BALLOONS) ×2 IMPLANT
BALLOON LUTONIX AV 10X40X75 (BALLOONS) ×2 IMPLANT
CATH ANGIO 5F BER2 65CM (CATHETERS) ×3 IMPLANT
CATH OMNI FLUSH 5F 65CM (CATHETERS) ×3 IMPLANT
CATH SOS OMNI O 5F 80CM (CATHETERS) ×3 IMPLANT
CATH STRAIGHT 5FR 65CM (CATHETERS) ×3 IMPLANT
DEVICE CONTINUOUS FLUSH (MISCELLANEOUS) ×3 IMPLANT
KIT ENCORE 26 ADVANTAGE (KITS) ×3 IMPLANT
KIT PV (KITS) ×3 IMPLANT
SHEATH BRITE TIP 8FR 35CM (SHEATH) ×3 IMPLANT
SHEATH DESTINATION 8F 45CM (SHEATH) IMPLANT
SHEATH PINNACLE 5F 10CM (SHEATH) ×3 IMPLANT
STENT ABSOLUTE 9X30X135 (Permanent Stent) ×3 IMPLANT
SYR MEDRAD MARK V 150ML (SYRINGE) ×3 IMPLANT
TRANSDUCER W/STOPCOCK (MISCELLANEOUS) ×3 IMPLANT
TRAY PV CATH (CUSTOM PROCEDURE TRAY) ×3 IMPLANT
WIRE AMPLATZ SS-J .035X180CM (WIRE) ×3 IMPLANT
WIRE BENTSON .035X145CM (WIRE) ×3 IMPLANT
WIRE HI TORQ VERSACORE 300 (WIRE) ×3 IMPLANT
WIRE MINI STICK MAX (SHEATH) ×3 IMPLANT

## 2016-11-12 NOTE — Op Note (Addendum)
OPERATIVE NOTE   PROCEDURE: 1.  Left common femoral artery cannulation under ultrasound guidance 2.  Placement of catheter in aorta 3.  Aortogram 4.  Conscious sedation for 62 minutes 5.  Left common iliac artery drug coated angioplasty (10 mm x 40 mm) 6.  Left common iliac artery and external iliac artery angioplasty stenting (9 mm x 30 mm) 7.  Left renal artery selection 8.  Left renal artery angiogram  PRE-OPERATIVE DIAGNOSIS: likely left iliac artery stenosis, decreasing ABI  POST-OPERATIVE DIAGNOSIS: same as above   SURGEON: Adele Barthel, MD  ANESTHESIA: conscious sedation  ESTIMATED BLOOD LOSS: 50 cc  CONTRAST: 50 cc  FINDING(S):  Aorta: Heavily diseased with multiple stenoses <30%   Right Left  RA occluded Patent, calcified proximal segment, widely patent renal stent  CIA occluded Patent common iliac artery stent with in-stent restenosis <30%: resolved with angioplasty  EIA occluded Patent with distal common and external iliac stenosis (gradient 10-15 mm Hg): resolved with stenting  IIA occluded patent  CFA Patent Patent, widely patent femorofemoral bypass  SFA Patent proximally Patent proximally  PFA Patent proximally Patent proximally   SPECIMEN(S):  none  INDICATIONS:   Jeffrey Stout is a 66 y.o. male who presents with decreasing ABI and elevated PSV in left iliac arterial system in the setting of prior left common iliac artery stenting and left-to-right femorofemoral bypass.  The patient presents for: aortogram, possible left iliac artery intervention.  I discussed with the patient the nature of angiographic procedures, especially the limited patencies of any endovascular intervention.  The patient is aware of that the risks of an angiographic procedure include but are not limited to: bleeding, infection, access site complications, renal failure, embolization, rupture of vessel, dissection, possible need for emergent surgical intervention, possible need for  surgical procedures to treat the patient's pathology, and stroke and death.  The patient is aware of the risks and agrees to proceed.  DESCRIPTION: After full informed consent was obtained from the patient, the patient was brought back to the angiography suite.  The patient was placed supine upon the angiography table and connected to cardiopulmonary monitoring equipment.  The patient was then given conscious sedation, the amounts of which are documented in the patient's chart.  A circulating radiologic technician maintained continuous monitoring of the patient's cardiopulmonary status.  Additionally, the control room radiologic technician provided backup monitoring throughout the procedure.  The patient was prepped and drape in the standard fashion for an angiographic procedure.  At this point, attention was turned to the left groin.    Under ultrasound guidance, the subcutaneous tissue surrounding the left common femoral artery was anesthesized with 1% lidocaine with epinephrine.  The artery was then cannulated distal to the femorofemoral graft with a micropuncture needle.  The microwire would not advance into the iliac arterial system, rather going into the circumflex artery.  I pulled the wire and needle and held pressure for 3 minutes.  Under ultrasound guidance, the subcutaneous tissue surrounding the left common femoral artery was anesthesized with 1% lidocaine with epinephrine.  The artery was then cannulated proximal to the femorofemoral graft with a micropuncture needle.  The microwire was advanced into the iliac arterial system.  The needle was exchanged for a microsheath, which was loaded into the common femoral artery over the wire.  The microwire was exchanged for an Amplatz wire which was advanced into the aorta.  The microsheath was then exchanged for a 5-Fr sheath which was loaded into the common femoral  artery.    The Omniflush catheter was then loaded over the wire up to the level of L1.   The catheter was connected to the power injector circuit.  After de-airring and de-clotting the circuit, a power injector aortogram was completed.  The findings are listed above.  Based on the images, I felt intervention on the neointimal hyperplasia in the left common iliac artery stent was indicated as this is the only inflow to the femorofemoral graft.  I also felt gradients needed to measured across the distal common iliac artery/external iliac artery stenosis and possibly the left renal artery stent.  I exchanged the left femoral sheath for a 8-Fr long sheath, which was advanced through the left common iliac artery stent.   The patient was given 8500 units of Heparin intravenously, which was a therapeutic bolus. In total, 9500 units of Heparin was administrated to achieve and maintain a therapeutic level of anticoagulation.   I placed a Lutonix 10 mm x 40 mm balloon over the wire and centered it on the stent.  I inflated the balloon to 8 atm for 3 minutes.  There was an obvious waist present with balloon inflation.  I deflated the balloon and removed it and did a hand injection to to image the common iliac artery stent and the distal common iliac artery/external iliac artery stenosis.  This demonstrated resolution of the right common iliac artery in-stent restenosis.  I placed a 5-Fr straight catheter over the wire and then removed the wire.  The catheter was connected to the pressure circuit and a pull-back gradient was completed over the the stenosis: 10-15 mm Hg.  Based on the gradient, I felt intervention was indicated.  I placed a 9 mm x 40 mm balloon centered on the stenosis.  This was inflated to 6 atm for 1 minute.  The balloon was deflated and then removed.  I selected a 9 mm x 30 mm self expanding stent.  This was centered on the stenosis and deployed without difficulty.  I replaced the balloon in the stent.  This was inflated to 6 atm for 1 minute to fully expand the stent.  The balloon was  deflated and removed.  A hand injection was completed: complete resolution of the stenosis.    I then placed a SOS catheter over the wire and selected the left renal stent.  I engaged the tip of the SOS catheter into the proximal renal artery.  I did a hand injection which demonstrated no evidence of in-stent restenosis, rather calcification in the proximal segment.  I disengaged the catheter and then straightened out the catheter.  I removed the catheter and wire.  I did one more hand injection via the left sheath to image the femorofemoral graft and proximal femoral arteries.  This appeared to be widely patent.  The sheath was aspirated.  No clots were present and the sheath was reloaded with heparinized saline.     COMPLICATIONS: none  CONDITION: stable   Adele Barthel, MD, [facility] [place] Office: [phone] Pager: [phone]  11/12/2016, 8:55 AM

## 2016-11-12 NOTE — Progress Notes (Signed)
   Daily Progress Note  No hematoma or bruit in L groin.  Anticipate DC tomorrow with follow in office in 4-6 weeks.   Adele Barthel, MD, FACS [facility] [place] Office: [phone] Pager: [phone]  11/12/2016, 7:41 PM

## 2016-11-12 NOTE — Progress Notes (Signed)
Pt settled into the room. Left groin level 0. Vitals stable bedrest 4 hours started at 1130.  Cecely Rengel, Mervin Kung RN

## 2016-11-12 NOTE — H&P (View-Only) (Signed)
Established Previous Bypass   History of Present Illness  Jeffrey Stout is a 66 y.o. ([birth year]) male who presents with chief complaint: some right hip pain with walking inclines.  Previous operation(s) include:   1. L-to-R fem-fem BPG, R fem EA w/ BPA, L limited EA w/ BPA (Date: 04/11/14). 2. PTA+S L RA, PTA+S L CIA (Date: 02/15/14)  The patient's symptoms have no progressed.  The patient's symptoms are: right hip pain radiating down thigh with walking inclines. The patient's treatment regimen currently included: maximal medical management.    Past Medical History:  Diagnosis Date  . GERD (gastroesophageal reflux disease)   . H/O renal artery stenosis   . Hyperlipidemia   . Hypertension   . Peripheral arterial disease ([place])   . Renal artery stenosis ([place])   . TIA (transient ischemic attack) 04/2013   went to [facility] for evaluation    Past Surgical History:  Procedure Laterality Date  . ABDOMINAL AORTAGRAM N/A 02/15/2014   Procedure: ABDOMINAL Maxcine Ham;  Surgeon: Conrad [place], MD;  Location: [facility] CATH LAB;  Service: Cardiovascular;  Laterality: N/A;  . CHOLECYSTECTOMY    . FEMORAL-FEMORAL BYPASS GRAFT Bilateral 04/11/2014   Procedure: BYPASS GRAFT LEFT-RIGHT FEMORAL-FEMORAL ARTERY and bilateral femoral endarterectomies with Bovine Patch Angioplasties.;  Surgeon: Conrad [place], MD;  Location: [place];  Service: Vascular;  Laterality: Bilateral;  . RENAL ARTERY STENT Left 2015  . VASCULAR SURGERY      Social History   Social History  . Marital status: Married    Spouse name: N/A  . Number of children: N/A  . Years of education: N/A   Occupational History  . Not on file.   Social History Main Topics  . Smoking status: Former Smoker    Packs/day: 1.50    Years: 45.00    Quit date: 04/24/2013  . Smokeless tobacco: Never Used  . Alcohol use No  . Drug use: No  . Sexual activity: Not on file   Other Topics Concern  . Not on file   Social History Narrative  . No  narrative on file    Family History  Problem Relation Age of Onset  . Diabetes Mother   . Hypertension Brother   . Heart attack Brother     Current Outpatient Prescriptions  Medication Sig Dispense Refill  . amLODipine (NORVASC) 10 MG tablet Take 10 mg by mouth daily.    Marland Kitchen aspirin 81 MG tablet Take 81 mg by mouth daily. Reported on 11/01/2015    . benazepril (LOTENSIN) 40 MG tablet Take 20 mg by mouth daily.     . clopidogrel (PLAVIX) 75 MG tablet Take 1 tablet (75 mg total) by mouth daily. 30 tablet 8  . hydrochlorothiazide (HYDRODIURIL) 25 MG tablet Take 25 mg by mouth daily. Reported on 11/01/2015    . metoprolol (LOPRESSOR) 50 MG tablet 50 mg 2 (two) times daily.  5  . naproxen sodium (ANAPROX) 220 MG tablet Take 220 mg by mouth daily as needed. Reported on 11/01/2015    . omeprazole (PRILOSEC) 20 MG capsule Take 20 mg by mouth daily as needed. Reported on 11/01/2015    . pravastatin (PRAVACHOL) 40 MG tablet Take 40 mg by mouth at bedtime.      No current facility-administered medications for this visit.      No Known Allergies   REVIEW OF SYSTEMS:  (Positives checked otherwise negative)  CARDIOVASCULAR:   [ ]  chest pain,  [ ]  chest pressure,  [ ]   palpitations,  [ ]  shortness of breath when laying flat,  [ ]  shortness of breath with exertion,   [x]  pain in feet when walking,  [ ]  pain in feet when laying flat, [ ]  history of blood clot in veins (DVT),  [ ]  history of phlebitis,  [ ]  swelling in legs,  [ ]  varicose veins  PULMONARY:   [ ]  productive cough,  [ ]  asthma,  [ ]  wheezing  NEUROLOGIC:   [ ]  weakness in arms or legs,  [ ]  numbness in arms or legs,  [ ]  difficulty speaking or slurred speech,  [ ]  temporary loss of vision in one eye,  [ ]  dizziness  HEMATOLOGIC:   [ ]  bleeding problems,  [ ]  problems with blood clotting too easily  MUSCULOSKEL:   [ ]  joint pain, [ ]  joint swelling  GASTROINTEST:   [ ]  vomiting blood,  [ ]  blood in stool      GENITOURINARY:   [ ]  burning with urination,  [ ]  blood in urine  PSYCHIATRIC:   [ ]  history of major depression  INTEGUMENTARY:   [ ]  rashes,  [ ]  ulcers  CONSTITUTIONAL:   [ ]  fever,  [ ]  chills   Physical Examination  Vitals:   11/06/16 0920 11/06/16 0921  BP: (!) 144/86 137/86  Pulse: (!) 57   Resp: 20   Temp: 97.3 F (36.3 C)   TempSrc: Oral   SpO2: 99%   Weight: 185 lb (83.9 kg)   Height: 6\' 2"  (1.88 m)     Body mass index is 23.75 kg/m.  General: Alert, O x 3, WD,NAD  ENT: erythema in posterior Oropharynx without exudate, +cerv LAD  Pulmonary: Sym exp, good B air movt,CTA B  Cardiac: RRR, Nl S1, S2, no Murmurs, No rubs, No S3,S4  Vascular: Vessel Right Left  Radial Palpable Palpable  Brachial Palpable Palpable  Carotid Palpable, No Bruit Palpable, No Bruit  Aorta Not palpable N/A  Femoral Palpable Palpable  Popliteal Not palpable Not palpable  PT Palpable Palpable  DP Palpable Palpable   Gastrointestinal: soft, non-distended, non-tender to palpation, No guarding or rebound, no HSM, no masses, no CVAT B, No palpable prominent aortic pulse,    Musculoskeletal: M/S 5/5 throughout  , Extremities without ischemic changes  , No edema present,  , No LDS present  Neurologic: CN 2-12 intact , Pain and light touch intact in extremities , Motor exam as listed above   Non-Invasive Vascular Imaging ABI (Date: 11/04/2016)  R:   ABI: 0.76 (0.81),   DP: bi  PT: bi  TBI:  0.59  L:   ABI: 0.81 (0.76),   DP: bi  PT: bi  TBI: 0.56  L Aortoiliac Duplex (Date: 11/04/2016)  Ao: 50 c/s  Stent: 119-152 c/s  Distal to stent: 464 c/s   Renal Duplex (11/06/2016)  Renal origin: 283 c/s  RA: 328-339 c/s  No mention of stent  Medical Decision Making  Jeffrey Stout is a 66 y.o. male who presents with: s/p L to R fem-fem BPG, R fem EA w/ BPA, limit L EA w/ BPA for claudication, L CIA PTA+S for high grade L CIA stenosis, L RA PTA+S for  renovascular HTN, chronic kidney disease stage 3    Given drop in ABI and high grade stenosis implied by aortoiliac duplex, I recommend: aortogram, possible left iliac artery intervention.  Pt has developed CKD stage 3, so admission for pre-hydration is recommended to try to limit  any CIN.   Procedure will be done primarily with CO2 contrast.  I discussed in depth with the patient the nature of atherosclerosis, and emphasized the importance of maximal medical management including strict control of blood pressure, blood glucose, and lipid levels, obtaining regular exercise, and cessation of smoking.    The patient is aware that without maximal medical management the underlying atherosclerotic disease process will progress, limiting the benefit of any interventions.  I discussed in depth with the patient the need for long term surveillance to improve the primary assisted patency of his bypass.  The patient agrees to cooperate with such. The patient is currently on a statin: Pravachol.  The patient is currently on an anti-platelet: ASA.  Thank you for allowing [place] to participate in this patient's care.   Adele Barthel, MD, FACS [facility] [place] Office: [phone] Pager: [phone]

## 2016-11-12 NOTE — H&P (Signed)
   History and Physical Update  The patient was interviewed and re-examined.  The patient's previous History and Physical has been reviewed and is unchanged from my consult .  There is no change in the plan of care: admit for hydration prior to Aortogram, possible iliac intervention tomorrow.   I discussed with the patient the nature of angiographic procedures, especially the limited patencies of any endovascular intervention.    The patient is aware of that the risks of an angiographic procedure include but are not limited to: bleeding, infection, access site complications, renal failure, embolization, rupture of vessel, dissection, arteriovenous fistula, possible need for emergent surgical intervention, possible need for surgical procedures to treat the patient's pathology, anaphylactic reaction to contrast, and stroke and death.    The patient is aware of the risks and agrees to proceed.   Adele Barthel, MD, FACS [facility] [place] Office: [phone] Pager: [phone]  11/12/2016, 7:25 AM

## 2016-11-12 NOTE — Interval H&P Note (Signed)
History and Physical Interval Note:  11/12/2016 7:26 AM  Jeffrey Stout  has presented today for surgery, with the diagnosis of pvd w/ right lower extremity claudication  The various methods of treatment have been discussed with the patient and family. After consideration of risks, benefits and other options for treatment, the patient has consented to  Procedure(s): Abdominal Aortogram w/Lower Extremity (N/A) as a surgical intervention .  The patient's history has been reviewed, patient examined, no change in status, stable for surgery.  I have reviewed the patient's chart and labs.  Questions were answered to the patient's satisfaction.     Adele Barthel

## 2016-11-12 NOTE — Progress Notes (Signed)
81fr sheath aspirated and removed from LFA , manual pressure applied for 20 minutes. Groin level 0. Tegaderm dressing applied. Bedrest instructions given.  Bilateral dp and pt pulses present with doppler.  Bedrest begins at 10:50:00

## 2016-11-13 ENCOUNTER — Telehealth: Payer: Self-pay | Admitting: Vascular Surgery

## 2016-11-13 LAB — BASIC METABOLIC PANEL
Anion gap: 10 (ref 5–15)
BUN: 11 mg/dL (ref 6–20)
CALCIUM: 8.5 mg/dL — AB (ref 8.9–10.3)
CO2: 25 mmol/L (ref 22–32)
CREATININE: 1.37 mg/dL — AB (ref 0.61–1.24)
Chloride: 103 mmol/L (ref 101–111)
GFR, EST NON AFRICAN AMERICAN: 52 mL/min — AB (ref >60)
Glucose, Bld: 117 mg/dL — ABNORMAL HIGH (ref 65–99)
Potassium: 3.3 mmol/L — ABNORMAL LOW (ref 3.5–5.1)
SODIUM: 138 mmol/L (ref 135–145)

## 2016-11-13 LAB — CBC
HCT: 42.9 % (ref 39.0–52.0)
Hemoglobin: 15 g/dL (ref 13.0–17.0)
MCH: 32.2 pg (ref 26.0–34.0)
MCHC: 35 g/dL (ref 30.0–36.0)
MCV: 92.1 fL (ref 78.0–100.0)
PLATELETS: 135 10*3/uL — AB (ref 150–400)
RBC: 4.66 MIL/uL (ref 4.22–5.81)
RDW: 13.2 % (ref 11.5–15.5)
WBC: 6.6 10*3/uL (ref 4.0–10.5)

## 2016-11-13 NOTE — Telephone Encounter (Signed)
-----   Message from Denman George, RN sent at 11/12/2016  3:23 PM EDT ----- Regarding: needs 4-6 week f/u with Dr. Bridgett Larsson; no vasc studies needed   ----- Message ----- From: Conrad [place], MD Sent: 11/12/2016   3:21 PM To: Denman George, RN Subject: RE: Vascular studies                           None  ----- Message ----- From: Denman George, RN Sent: 11/12/2016   2:59 PM To: Conrad [place], MD Subject: Vascular studies                               What vasc studies do you want with f/u?   ----- Message ----- From: Conrad [place], MD Sent: 11/12/2016   9:16 AM To: Vvs Charge [address]  Jeffrey Stout [id number] 1951-05-29  PROCEDURE: 1.  Left common femoral artery cannulation under ultrasound guidance 2.  Placement of catheter in aorta 3.  Aortogram 4.  Conscious sedation for 62 minutes 5.  Left common iliac artery drug coated angioplasty (10 mm x 40 mm) 6.  Left common iliac artery and external iliac artery angioplasty stenting (9 mm x 30 mm) 7.  Left renal artery selection 8.  Left renal artery angiogram  Follow-up: 4-6 weeks

## 2016-11-13 NOTE — Care Management Note (Signed)
Case Management Note Marvetta Gibbons RN, BSN Unit 2W-Case Manager [phone]  Patient Details  Name: Jeffrey Stout MRN: [id number] Date of Birth: 1950/11/01  Subjective/Objective:  Admitted with artery stenosis s/p angioplasty stenting                  Action/Plan: PTA pt lived at home- anticipate return home  Expected Discharge Date:  11/13/16               Expected Discharge Plan:  Home/Self Care  In-House Referral:     Discharge planning Services  CM Consult  Post Acute Care Choice:  NA Choice offered to:  NA  DME Arranged:    DME Agency:     HH Arranged:    [place] Agency:     Status of Service:  Completed, signed off  If discussed at [place] of Stay Meetings, dates discussed:    Discharge Disposition: home/self care   Additional Comments:  11/13/16- 1030- Jeffrey Einspahr RN,, CM- pt for d/c home today - No CM needs noted for discharge.  Jeffrey Patricia, RN 11/13/2016, 10:34 AM

## 2016-11-13 NOTE — Telephone Encounter (Signed)
Sched appt 12/11/16 at 2:15. Spoke to pt and wife to inform them of appt.

## 2016-11-13 NOTE — Consult Note (Signed)
[facility] Primary Care Navigator  11/13/2016  Jeffrey Stout December 18, 1950 [id number]   Met with patient and wife [name]) at the bedside to identify possible discharge needs. Patient reports having elevated blood pressure readings, right hip and thigh pain when  walking that had led to this admission.  Patient endorses Dr. Cathren Laine with [facility] as the primary care provider.    Patient shared using [facility] in [place] to obtain medications without any problem.   Patient's wife reports that he manages his own medications at home using "pill box" system weekly.   He states being able to drive prior to admission and wife provides transportation to his doctors' appointments as needed.  Patient's wife is the primary caregiver at home.   Anticipated discharge plan is home with wife per patient.  Patient and wife voiced understanding to call primary care provider's office when he gets back home, for a post discharge follow-up appointment within a week or sooner if needs arise. Patient letter (with PCP's contact number) was provided as their reminder.  Patient communicated no further health management needs or concerns at this time.  For additional questions please contact:  Edwena Felty A. Ashaunte Standley, BSN, RN-BC [facility] PRIMARY CARE Navigator Cell: [phone]

## 2016-11-16 NOTE — Discharge Summary (Signed)
[facility] Discharge Summary   Patient ID:  Jeffrey Stout MRN: [id number] DOB/AGE: 1951-06-14 66 y.o.  Admit date: 11/11/2016 Discharge date: 11/12/2016 Date of Surgery: 11/11/2016 - 11/12/2016 Surgeon: Juliann Mule): [place], MD  Admission Diagnosis: pvd with right lower extremity claudication Stage 3 Kidney Disease  Discharge Diagnoses:  pvd with right lower extremity claudication Stage 3 Kidney Disease  Secondary Diagnoses: Past Medical History:  Diagnosis Date  . GERD (gastroesophageal reflux disease)   . H/O renal artery stenosis   . Hyperlipidemia   . Hypertension   . Peripheral arterial disease ([place])   . Renal artery stenosis ([place])   . TIA (transient ischemic attack) 04/2013   went to [facility] for evaluation    Procedure(s): Abdominal Aortogram w/Lower Extremity Peripheral Vascular Intervention  Discharged Condition: good  HPI: Jeffrey Stout is a 66 y.o. ([birth year]) male who presents with chief complaint: some right hip pain with walking inclines.  Previous operation(s) include:   1. L-to-R fem-fem BPG, R fem EA w/ BPA, L limited EA w/ BPA (Date: 04/11/14). 2. PTA+S L RA, PTA+S L CIA (Date: 02/15/14)  The patient's symptoms have no progressed.  The patient's symptoms are: right hip pain radiating down thigh with walking inclines. The patient's treatment regimen currently included: maximal medical management.    Hospital Course:  Jeffrey Stout is a 66 y.o. male is S/P  Procedure(s): Abdominal Aortogram w/Lower Extremity Peripheral Vascular Intervention  PROCEDURE: 1.  Left common femoral artery cannulation under ultrasound guidance 2.  Placement of catheter in aorta 3.  Aortogram 4.  Conscious sedation for 62 minutes 5.  Left common iliac artery drug coated angioplasty (10 mm x 40 mm) 6.  Left common iliac artery and external iliac artery angioplasty stenting (9 mm x 30 mm) 7.  Left renal artery selection 8.  Left renal artery  angiogram   FINDING(S):  Aorta: Heavily diseased with multiple stenoses <30%   Right Left  RA occluded Patent, calcified proximal segment, widely patent renal stent  CIA occluded Patent common iliac artery stent with in-stent restenosis <30%: resolved with angioplasty  EIA occluded Patent with distal common and external iliac stenosis (gradient 10-15 mm Hg): resolved with stenting  IIA occluded patent  CFA Patent Patent, widely patent femorofemoral bypass  SFA Patent proximally Patent proximally  PFA Patent proximally Patent proximally   No over night events seen at bedside by Dr. Bridgett Larsson.  Stable for discharge home per Dr. Bridgett Larsson.  Significant Diagnostic Studies: CBC Lab Results  Component Value Date   WBC 6.6 11/13/2016   HGB 15.0 11/13/2016   HCT 42.9 11/13/2016   MCV 92.1 11/13/2016   PLT 135 (L) 11/13/2016    BMET    Component Value Date/Time   NA 138 11/13/2016 0142   K 3.3 (L) 11/13/2016 0142   CL 103 11/13/2016 0142   CO2 25 11/13/2016 0142   GLUCOSE 117 (H) 11/13/2016 0142   BUN 11 11/13/2016 0142   CREATININE 1.37 (H) 11/13/2016 0142   CALCIUM 8.5 (L) 11/13/2016 0142   GFRNONAA 52 (L) 11/13/2016 0142   GFRAA >60 11/13/2016 0142   COAG Lab Results  Component Value Date   INR 0.99 11/11/2016   INR 1.02 04/05/2014   INR 0.99 02/14/2014     Disposition:  Discharge to :Home Discharge Instructions    Call MD for:  redness, tenderness, or signs of infection (pain, swelling, bleeding, redness, odor or green/yellow discharge around incision site)    Complete by:  As directed    Call MD for:  severe or increased pain, loss or decreased feeling  in affected limb(s)    Complete by:  As directed    Call MD for:  temperature >100.5    Complete by:  As directed    Discharge instructions    Complete by:  As directed    You may shower in 24 hours take groin dressing off prior to showering.  Place band aide over stick site PRN.   Driving Restrictions    Complete  by:  As directed    No driving for 24 hours   Increase activity slowly    Complete by:  As directed    Walk with assistance use walker or cane as needed   Lifting restrictions    Complete by:  As directed    No heavy lifting for 3-4 weeks   Resume previous diet    Complete by:  As directed      Allergies as of 11/13/2016   No Known Allergies     Medication List    TAKE these medications   amLODipine 10 MG tablet Commonly known as:  NORVASC Take 10 mg by mouth 2 (two) times daily.   aspirin 81 MG tablet Take 81 mg by mouth daily. Reported on 11/01/2015   benazepril 40 MG tablet Commonly known as:  LOTENSIN Take 40 mg by mouth daily.   clopidogrel 75 MG tablet Commonly known as:  PLAVIX Take 1 tablet (75 mg total) by mouth daily.   metoprolol 50 MG tablet Commonly known as:  LOPRESSOR Take 50 mg by mouth 2 (two) times daily.   pravastatin 40 MG tablet Commonly known as:  PRAVACHOL Take 40 mg by mouth at bedtime.      Verbal and written Discharge instructions given to the patient. Wound care per Discharge AVS Follow-up Information    Adele Barthel, MD Follow up in 4 week(s).   Specialties:  Vascular Surgery, Cardiology Why:  office will call for appointment Contact information: [address] [place] [address] [phone]           Signed: [facility] 11/16/2016, 8:30 AM   Addendum  This patient underwent reintervention on the L iliac arterial system as this is the only inflow to his femorofemoral bypass.  He required [facility] angioplasty of prior R CIA stent and placement of 9 mm x 30 mm stent in distal CIA/EIA.  He was observed overnight given history of CKD Stage 3 and large 8-Fr sheath needed.  I will have the pt follow-up in the office in 4 weeks.   Adele Barthel, MD, FACS [facility] [place] Office: [phone] Pager: [phone]  11/16/2016, 9:10 AM

## 2016-11-17 ENCOUNTER — Telehealth: Payer: Self-pay

## 2016-11-17 NOTE — Telephone Encounter (Signed)
rec'd phone call from Dr. Bridgett Larsson.  Advised that the pt's Renal Stent was patent with recent Aortogram/ Renal Angiogram.  Advised the pt. Should continue taking the BP medications as he was taking prior to the procedure.  Per Dr. Bridgett Larsson, there may have been miscommunication in his directions at discharge.  Phone call to pt.  Advised of Dr. Lianne Moris recommendations to resume the doses of BP medications as they were prior to the procedure.  Verb. Understanding.

## 2016-11-17 NOTE — Telephone Encounter (Signed)
Pt. Called to report high BP; 190/98 pm of 3/26, and 152/98 this AM.  Stated his BP doses were changed when he was discharged from hospital following his procedure on 3/22.  Reported the Amlodipine 10 mg was changed from bid to daily, and the Metoprolol 50 mg was changed from bid to daily.  Advised the pt. To contact his PCP re: the elevated BP.  Stated he called his PCP and was advised to contact the Vasc. Surgeon.  Advised the pt. His discharge medications don't indicate a change in the frequency of the BP medication as he has noted.  per DC medication list the Amlodipine 10 mg is BID, and Metoprolol 50 mg is BID.  Pt. Stated he was given a chart when he left the hospital with the change in frequency of above meds.  Will clarify with Dr. Bridgett Larsson, and return call to pt.  The pt. voiced concern that he only has one kidney functioning, and for this reason would like to get the BP better controlled.

## 2016-11-30 ENCOUNTER — Encounter: Payer: Self-pay | Admitting: Vascular Surgery

## 2016-12-07 NOTE — Progress Notes (Signed)
Postoperative Visit   History of Present Illness  Jeffrey Stout is a 66 y.o. male who presents for postoperative follow-up from recent angio procedure.  Prior procedures include: 1.  DCB PTA L CIA stent, PTA+S L distal CIA/proximal EIA (11/12/16) 2.  L-to-R femofemoral bypass, R fem EA w/ BPA, limited L fem EA w/ BPA (04/11/14) 3.  PTA+S L CIA, L renal PTA+S (02/15/14) for renovascular HTN and R leg claudication  The patient's wounds are healed.  The patient notes resolution of lower extremity symptoms.  The patient is able to complete their activities of daily living.  The patient's current symptoms are: none.  Past Medical History, Past Surgical History, Social History, Family History, Medications, Allergies, and Review of Systems are unchanged from previous evaluation on 11/12/16.  Current Outpatient Prescriptions  Medication Sig Dispense Refill  . amLODipine (NORVASC) 10 MG tablet Take 10 mg by mouth 2 (two) times daily.     Marland Kitchen aspirin 81 MG tablet Take 81 mg by mouth daily. Reported on 11/01/2015    . benazepril (LOTENSIN) 40 MG tablet Take 40 mg by mouth daily.     . clopidogrel (PLAVIX) 75 MG tablet Take 1 tablet (75 mg total) by mouth daily. 30 tablet 8  . metoprolol (LOPRESSOR) 50 MG tablet Take 50 mg by mouth 2 (two) times daily.   5  . pravastatin (PRAVACHOL) 40 MG tablet Take 40 mg by mouth at bedtime.      No current facility-administered medications for this visit.     No Known Allergies   For VQI Use Only  PRE-ADM LIVING: Home  AMB STATUS: Ambulatory   Physical Examination  Vitals:   12/11/16 1434  BP: 131/87  Pulse: (!) 58  Resp: 16  Temp: 98.3 F (36.8 C)  TempSrc: Oral  SpO2: 97%  Weight: 183 lb (83 kg)  Height: 6\' 2"  (1.88 m)    Body mass index is 23.5 kg/m.  General: Alert, O x 3, WD,NAD  Pulmonary: Sym exp, good B air movt,CTA B  Cardiac: RRR, Nl S1, S2, no Murmurs, No rubs, No S3,S4  Vascular: Vessel Right Left  Radial Palpable  Palpable  Brachial Palpable Palpable  Carotid Palpable, No Bruit Palpable, No Bruit  Aorta Not palpable N/A  Femoral Palpable Palpable  Popliteal Not palpable Not palpable  PT Palpable Palpable  DP Palpable Palpable   Gastrointestinal: soft, non-distended, non-tender to palpation, No guarding or rebound, no HSM, no masses, no CVAT B, No palpable prominent aortic pulse,    Musculoskeletal: M/S 5/5 throughout , Extremities without ischemic changes , No edema present,  No LDS present  Neurologic: CN 2-12 intact , Pain and light touch intact in extremities , Motor exam as listed above   Medical Decision Making  Jeffrey Stout is a 67 y.o. male who presents s/p L CIA and EIA PTA+S, L CIA DCB PTA, L-to-R femofemoral bypass for R leg intermittent claudication, L RA PTA+S   Based on his angiographic findings, this patient needs: q3 month BLE ABI and aortoiliac duplex. I discussed in depth with the patient the nature of atherosclerosis, and emphasized the importance of maximal medical management including strict control of blood pressure, blood glucose, and lipid levels, obtaining regular exercise, and cessation of smoking.  The patient is aware that without maximal medical management the underlying atherosclerotic disease process will progress, limiting the benefit of any interventions. The patient is currently on a statin: Pravachol.  The patient is currently on an anti-platelet: Plavix.  Thank you for allowing [place] to participate in this patient's care.   Adele Barthel, MD, FACS [facility] [place] Office: [phone] Pager: [phone]

## 2016-12-11 ENCOUNTER — Encounter: Payer: Self-pay | Admitting: Vascular Surgery

## 2016-12-11 ENCOUNTER — Ambulatory Visit (INDEPENDENT_AMBULATORY_CARE_PROVIDER_SITE_OTHER): Payer: Medicare Other | Admitting: Vascular Surgery

## 2016-12-11 VITALS — BP 131/87 | HR 58 | Temp 98.3°F | Resp 16 | Ht 74.0 in | Wt 183.0 lb

## 2016-12-11 DIAGNOSIS — I779 Disorder of arteries and arterioles, unspecified: Secondary | ICD-10-CM | POA: Diagnosis not present

## 2016-12-11 DIAGNOSIS — I745 Embolism and thrombosis of iliac artery: Secondary | ICD-10-CM

## 2016-12-11 DIAGNOSIS — I70211 Atherosclerosis of native arteries of extremities with intermittent claudication, right leg: Secondary | ICD-10-CM

## 2016-12-14 NOTE — Addendum Note (Signed)
Addended by: Lianne Cure A on: 12/14/2016 09:28 AM   Modules accepted: Orders

## 2017-01-01 ENCOUNTER — Other Ambulatory Visit: Payer: Self-pay | Admitting: Vascular Surgery

## 2017-01-01 DIAGNOSIS — I739 Peripheral vascular disease, unspecified: Secondary | ICD-10-CM

## 2017-01-01 MED ORDER — CLOPIDOGREL BISULFATE 75 MG PO TABS: 75.0000 mg | ORAL_TABLET | Freq: Every day | ORAL | 8 refills | Status: DC

## 2017-02-02 ENCOUNTER — Other Ambulatory Visit: Payer: Self-pay | Admitting: Vascular Surgery

## 2017-02-02 DIAGNOSIS — I739 Peripheral vascular disease, unspecified: Secondary | ICD-10-CM

## 2017-02-02 MED ORDER — CLOPIDOGREL BISULFATE 75 MG PO TABS: 75.0000 mg | ORAL_TABLET | Freq: Every day | ORAL | 8 refills | Status: DC

## 2017-02-04 ENCOUNTER — Other Ambulatory Visit: Payer: Self-pay

## 2017-02-04 MED ORDER — CLOPIDOGREL BISULFATE 75 MG PO TABS: 75.0000 mg | ORAL_TABLET | Freq: Every day | ORAL | 4 refills | Status: DC

## 2017-04-02 ENCOUNTER — Ambulatory Visit (HOSPITAL_COMMUNITY)
Admission: RE | Admit: 2017-04-02 | Discharge: 2017-04-02 | Disposition: A | Discharge status: Home / Self Care | Payer: Medicare Other | Source: Ambulatory Visit | Attending: Vascular Surgery | Admitting: Vascular Surgery

## 2017-04-02 ENCOUNTER — Ambulatory Visit (INDEPENDENT_AMBULATORY_CARE_PROVIDER_SITE_OTHER)
Admission: RE | Admit: 2017-04-02 | Discharge: 2017-04-02 | Disposition: A | Discharge status: Home / Self Care | Payer: Medicare Other | Source: Ambulatory Visit | Attending: Vascular Surgery | Admitting: Vascular Surgery

## 2017-04-02 DIAGNOSIS — I70211 Atherosclerosis of native arteries of extremities with intermittent claudication, right leg: Secondary | ICD-10-CM | POA: Diagnosis not present

## 2017-04-02 DIAGNOSIS — I745 Embolism and thrombosis of iliac artery: Secondary | ICD-10-CM | POA: Diagnosis not present

## 2017-04-02 DIAGNOSIS — Z95820 Peripheral vascular angioplasty status with implants and grafts: Secondary | ICD-10-CM | POA: Diagnosis not present

## 2017-04-02 DIAGNOSIS — Z9889 Other specified postprocedural states: Secondary | ICD-10-CM | POA: Insufficient documentation

## 2017-04-06 ENCOUNTER — Encounter: Payer: Self-pay | Admitting: Vascular Surgery

## 2017-04-09 NOTE — Progress Notes (Signed)
Established Aortoiliac Disease and Renal Artery Stenosis   History of Present Illness   Jeffrey Stout is a 66 y.o. (Apr 16, 1951) male who presents with chief complaint: resolved right leg claudication.    Prior procedures include: 1.  DCB PTA L CIA stent, PTA+S L distal CIA/proximal EIA (11/12/16) 2.  L-to-R femofemoral bypass, R fem EA w/ BPA, limited L fem EA w/ BPA (04/11/14) 3.  PTA+S L CIA, L renal PTA+S (02/15/14) for renovascular HTN and R leg claudication  The patient's symptoms have not progressed.  The patient's symptoms are: none.  The patient's treatment regimen currently included: maximal medical management and walking plan.  Pt has resumed work.  The patient's PMH, PSH, and SH, and FamHx are unchanged from 12/11/16.  Current Outpatient Prescriptions  Medication Sig Dispense Refill  . amLODipine (NORVASC) 10 MG tablet Take 10 mg by mouth 2 (two) times daily.     Marland Kitchen aspirin 81 MG tablet Take 81 mg by mouth daily. Reported on 11/01/2015    . benazepril (LOTENSIN) 40 MG tablet Take 40 mg by mouth daily.     . clopidogrel (PLAVIX) 75 MG tablet Take 1 tablet (75 mg total) by mouth daily. 90 tablet 4  . metoprolol (LOPRESSOR) 50 MG tablet Take 50 mg by mouth 2 (two) times daily.   5  . pravastatin (PRAVACHOL) 40 MG tablet Take 40 mg by mouth at bedtime.      No current facility-administered medications for this visit.     On ROS today: no intermittent claudication , no wounds.   Physical Examination   Vitals:   04/16/17 0833  BP: (!) 154/86  Pulse: (!) 50  Temp: (!) 97.1 F (36.2 C)  Weight: 180 lb 6.4 oz (81.8 kg)  Height: 6\' 3"  (1.905 m)   Body mass index is 22.55 kg/m.  General Alert, O x 3, WD, NAD  Pulmonary Sym exp, good B air movt, CTA B  Cardiac RRR, Nl S1, S2, no Murmurs, No rubs, No S3,S4  Vascular Vessel Right Left  Radial Palpable Palpable  Brachial Palpable Palpable  Carotid Palpable, No Bruit Palpable, No Bruit  Aorta Not palpable N/A  Femoral  Palpable Palpable  Popliteal Not palpable Not palpable  PT Palpable Palpable  DP Palpable Palpable    Gastro- intestinal soft, non-distended, non-tender to palpation, No guarding or rebound, no HSM, no masses, no CVAT B, No palpable prominent aortic pulse,    Musculo- skeletal M/S 5/5 throughout  , Extremities without ischemic changes  , No edema present, No visible varicosities , No Lipodermatosclerosis present  Neurologic Pain and light touch intact in extremities , Motor exam as listed above    Non-Invasive Vascular imaging   ABI (04/02/17)  R:   ABI: 1.12 (0.76),   PT: tri  DP: tri  TBI:  0.81  L:   ABI: 1.12 (0.81),   PT: tri  DP: tri  TBI: 0.71  Aortoiliac Duplex (04/02/17)  Ao: 78 c/s  R iliac: occluded  L iliac: Proximal CIA 332 c/s, stent patent 149-182 c/s  Patent fem-fem BPG   Medical Decision Making   [name] is a 66 y.o. male who presents with:  s/p L CIA and EIA PTA+S, L CIA DCB PTA, L-to-R femofemoral bypass for R leg intermittent claudication, L RA PTA+S    Based on the patient's vascular studies and examination, I have offered the patient: q3 month ABI and aortoiliac duplex.  I spent 5-10 minute re-emphasizing the need to  stop smoking as all vascular surgical interventions have limited patency in smokers.  I discussed in depth with the patient the nature of atherosclerosis, and emphasized the importance of maximal medical management including strict control of blood pressure, blood glucose, and lipid levels, antiplatelet agents, obtaining regular exercise, and cessation of smoking.    The patient is aware that without maximal medical management the underlying atherosclerotic disease process will progress, limiting the benefit of any interventions. The patient is currently on a statin: Pravachol.  The patient is currently on an anti-platelet: Plavix.  Thank you for allowing [place] to participate in this patient's care.   Adele Barthel, MD,  FACS [facility] [place] Office: [phone] Pager: [phone]

## 2017-04-16 ENCOUNTER — Encounter: Payer: Self-pay | Admitting: Vascular Surgery

## 2017-04-16 ENCOUNTER — Ambulatory Visit (INDEPENDENT_AMBULATORY_CARE_PROVIDER_SITE_OTHER): Payer: Medicare Other | Admitting: Vascular Surgery

## 2017-04-16 VITALS — BP 154/86 | HR 50 | Temp 97.1°F | Ht 75.0 in | Wt 180.4 lb

## 2017-04-16 DIAGNOSIS — I70211 Atherosclerosis of native arteries of extremities with intermittent claudication, right leg: Secondary | ICD-10-CM

## 2017-04-16 DIAGNOSIS — I779 Disorder of arteries and arterioles, unspecified: Secondary | ICD-10-CM

## 2017-04-16 DIAGNOSIS — H40033 Anatomical narrow angle, bilateral: Secondary | ICD-10-CM | POA: Diagnosis not present

## 2017-04-16 DIAGNOSIS — H2513 Age-related nuclear cataract, bilateral: Secondary | ICD-10-CM | POA: Diagnosis not present

## 2017-04-23 NOTE — Addendum Note (Signed)
Addended by: Lianne Cure A on: 04/23/2017 12:27 PM   Modules accepted: Orders

## 2017-07-23 ENCOUNTER — Encounter (HOSPITAL_COMMUNITY): Payer: Medicare Other

## 2017-07-23 ENCOUNTER — Ambulatory Visit: Payer: Medicare Other | Admitting: Vascular Surgery

## 2017-08-03 NOTE — Progress Notes (Signed)
Established Aortoiliac Disease and Renal Artery Stenosis   History of Present Illness   Jeffrey Stout is a 66 y.o. (09-06-1950) male who presents with chief complaint: resolved right leg claudication.    Prior procedures include: 1. DCB PTA L CIA stent, PTA+S L distal CIA/proximal EIA (11/12/16) 2. L-to-R femofemoral bypass, R fem EA w/ BPA, limited L fem EA w/ BPA (04/11/14) 3. PTA+S L CIA, L renal PTA+S (02/15/14) for renovascular HTN and R leg claudication  The patient's symptoms have not progressed.  The patient's symptoms are: none.  The patient's treatment regimen currently included: maximal medical management and walking plan.    The patient's PMH, PSH, and SH, and FamHx are unchanged from 04/16/17.  Current Outpatient Medications  Medication Sig Dispense Refill  . amLODipine (NORVASC) 10 MG tablet Take 10 mg by mouth 2 (two) times daily.     Marland Kitchen aspirin 81 MG tablet Take 81 mg by mouth daily. Reported on 11/01/2015    . benazepril (LOTENSIN) 40 MG tablet Take 40 mg by mouth daily.     . clopidogrel (PLAVIX) 75 MG tablet Take 1 tablet (75 mg total) by mouth daily. 90 tablet 4  . metoprolol (LOPRESSOR) 50 MG tablet Take 50 mg by mouth 2 (two) times daily.   5  . pravastatin (PRAVACHOL) 40 MG tablet Take 40 mg by mouth at bedtime.      No current facility-administered medications for this visit.    On ROS today: no intermittent claudication , no CVA sx   Physical Examination   Vitals:   08/06/17 0906  BP: 140/82  Pulse: (!) 53  Resp: 16  Temp: (!) 97.3 F (36.3 C)  TempSrc: Oral  SpO2: 99%  Weight: 184 lb (83.5 kg)  Height: 6\' 3"  (1.905 m)   Body mass index is 23 kg/m.  General Alert, O x 3, WD, NAD  Pulmonary Sym exp, good B air movt, CTA B  Cardiac RRR, Nl S1, S2, no Murmurs, No rubs, No S3,S4  Vascular Vessel Right Left  Radial Palpable Palpable  Brachial Palpable Palpable  Carotid Palpable, No Bruit Palpable, No Bruit  Aorta Not palpable N/A   Femoral Palpable Palpable  Popliteal Not palpable Not palpable  PT Palpable Palpable  DP Palpable Palpable    Gastro- intestinal soft, non-distended, non-tender to palpation, No guarding or rebound, no HSM, no masses, no CVAT B, No palpable prominent aortic pulse,    Musculo- skeletal M/S 5/5 throughout  , Extremities without ischemic changes  , No edema present, No visible varicosities , No Lipodermatosclerosis present, palpable fem-fem BPG  Neurologic Pain and light touch intact in extremities , Motor exam as listed above    Non-invasive Vascular Imaging   ABI (08/06/2017)  R:   ABI: 1.01 (1.12),   PT: tri  DP: tri  TBI:  0.83 (0.81)  L:   ABI: 1.01 (1.12),   PT: tri  DP: tri  TBI: 0.80 (0.71)  Aortoiliac Duplex (08/06/2017)  Ao: 83 c/s  R iliac: occluded  L iliac: Proximal CIA 121 c/s, stent patent, 151-361 c/s (Vr < 3)   Medical Decision Making   Jeffrey Stout is a 66 y.o. ([birth year]) male who presents with:  s/p L CIA and EIA PTA+S, L CIA DCB PTA, L-to-R femofemoral bypass for R leg intermittent claudication, L RA PTA+S    Based on the patient's vascular studies and examination, I have offered the patient: q6 month ABI and aortoiliac duplex.  I discussed in  depth with the patient the nature of atherosclerosis, and emphasized the importance of maximal medical management including strict control of blood pressure, blood glucose, and lipid levels, antiplatelet agents, obtaining regular exercise, and cessation of smoking.    The patient is aware that without maximal medical management the underlying atherosclerotic disease process will progress, limiting the benefit of any interventions.  The patient is currently on a statin: Pravachol.   The patient is currently on an anti-platelet: Plavix.  Thank you for allowing [place] to participate in this patient's care.   Adele Barthel, MD, FACS [facility] [place] Office:  [phone] Pager: [phone]

## 2017-08-06 ENCOUNTER — Ambulatory Visit (INDEPENDENT_AMBULATORY_CARE_PROVIDER_SITE_OTHER): Payer: Medicare Other | Admitting: Vascular Surgery

## 2017-08-06 ENCOUNTER — Ambulatory Visit (HOSPITAL_COMMUNITY)
Admission: RE | Admit: 2017-08-06 | Discharge: 2017-08-06 | Disposition: A | Discharge status: Home / Self Care | Payer: Medicare Other | Source: Ambulatory Visit | Attending: Vascular Surgery | Admitting: Vascular Surgery

## 2017-08-06 ENCOUNTER — Encounter: Payer: Self-pay | Admitting: Vascular Surgery

## 2017-08-06 ENCOUNTER — Ambulatory Visit (INDEPENDENT_AMBULATORY_CARE_PROVIDER_SITE_OTHER)
Admission: RE | Admit: 2017-08-06 | Discharge: 2017-08-06 | Disposition: A | Discharge status: Home / Self Care | Payer: Medicare Other | Source: Ambulatory Visit | Attending: Vascular Surgery | Admitting: Vascular Surgery

## 2017-08-06 VITALS — BP 140/82 | HR 53 | Temp 97.3°F | Resp 16 | Ht 75.0 in | Wt 184.0 lb

## 2017-08-06 DIAGNOSIS — I70211 Atherosclerosis of native arteries of extremities with intermittent claudication, right leg: Secondary | ICD-10-CM

## 2017-08-06 DIAGNOSIS — I701 Atherosclerosis of renal artery: Secondary | ICD-10-CM

## 2017-08-06 DIAGNOSIS — I745 Embolism and thrombosis of iliac artery: Secondary | ICD-10-CM | POA: Diagnosis not present

## 2017-08-06 DIAGNOSIS — I779 Disorder of arteries and arterioles, unspecified: Secondary | ICD-10-CM

## 2017-08-25 NOTE — Addendum Note (Signed)
Addended by: Lianne Cure A on: 08/25/2017 04:09 PM   Modules accepted: Orders

## 2018-01-12 DIAGNOSIS — I129 Hypertensive chronic kidney disease with stage 1 through stage 4 chronic kidney disease, or unspecified chronic kidney disease: Secondary | ICD-10-CM | POA: Diagnosis not present

## 2018-01-12 DIAGNOSIS — Z8679 Personal history of other diseases of the circulatory system: Secondary | ICD-10-CM | POA: Diagnosis not present

## 2018-01-12 DIAGNOSIS — N261 Atrophy of kidney (terminal): Secondary | ICD-10-CM | POA: Diagnosis not present

## 2018-01-12 DIAGNOSIS — Z720 Tobacco use: Secondary | ICD-10-CM | POA: Diagnosis not present

## 2018-01-12 DIAGNOSIS — N183 Chronic kidney disease, stage 3 (moderate): Secondary | ICD-10-CM | POA: Diagnosis not present

## 2018-02-01 NOTE — Progress Notes (Signed)
Established Aortoiliac Disease and Renal Artery Stenosis   History of Present Illness   Jeffrey Stout is a 67 y.o. (1951-07-16) male who presents with chief complaint: recent episode of bilateral arm numbness.    Prior procedures include: 1. DCB PTA L CIA stent, PTA+S L distal CIA/proximal EIA (11/12/16) 2. L-to-R femorofemoral bypass, R fem EA w/ BPA, limited L fem EA w/ BPA (04/11/14) 3. PTA+S L CIA, L renal PTA+S (02/15/14) for renovascular HTN and R leg claudication  The patient's symptoms have NOT progressed.  The patient's symptoms are: no leg sx.  The patient had a brief episode of numbness radiating from neck down to both arms.  This was self limiting.  The patient's treatment regimen currently included: maximal medical management.  The patient's PMH, PSH, SH, and FamHx were reviewed on 02/02/18 are unchanged from 08/06/17.  Current Outpatient Medications  Medication Sig Dispense Refill  . amLODipine (NORVASC) 10 MG tablet Take 10 mg by mouth 2 (two) times daily.     Marland Kitchen aspirin 81 MG tablet Take 81 mg by mouth daily. Reported on 11/01/2015    . benazepril (LOTENSIN) 40 MG tablet Take 40 mg by mouth daily.     . clopidogrel (PLAVIX) 75 MG tablet Take 1 tablet (75 mg total) by mouth daily. 90 tablet 4  . metoprolol (LOPRESSOR) 50 MG tablet Take 50 mg by mouth 2 (two) times daily.   5  . pravastatin (PRAVACHOL) 40 MG tablet Take 40 mg by mouth at bedtime.      No current facility-administered medications for this visit.     On ROS today: no intermittent claudication , no rest pain.   Physical Examination   Vitals:   02/02/18 0918  BP: (!) 150/87  Pulse: 87  Temp: (!) 97 F (36.1 C)  SpO2: 100%  Weight: 181 lb (82.1 kg)  Height: 6\' 3"  (1.905 m)   Body mass index is 22.62 kg/m.  General Alert, O x 3, WD, NAD  Pulmonary Sym exp, good B air movt, CTA B  Cardiac RRR, Nl S1, S2, no Murmurs, No rubs, No S3,S4  Vascular Vessel Right Left  Radial Palpable Palpable    Brachial Palpable Palpable  Carotid Palpable, No Bruit Palpable, No Bruit  Aorta Not palpable N/A  Femoral Palpable Palpable  Popliteal Not palpable Not palpable  PT Palpable Palpable  DP Palpable Palpable    Gastro- intestinal soft, non-distended, non-tender to palpation, No guarding or rebound, no HSM, no masses, no CVAT B, No palpable prominent aortic pulse,    Musculo- skeletal M/S 5/5 throughout  , Extremities without ischemic changes  , No edema present, No visible varicosities , No Lipodermatosclerosis present, palpable fem-fem graft pulse  Neurologic Pain and light touch intact in extremities , Motor exam as listed above    Non-Invasive Vascular imaging   ABI (02/02/2018)  R:   ABI: 0.91 (1.01),   PT: tri  DP: tri  TBI:  0.60  L:   ABI: 0.96 (1.01),   PT: tri  DP: tri  TBI: 0.48  Aortoiliac Duplex (02/02/2018)  Ao: 65 c/s, distal aortic stenosis with associate plaque (>75% lumen on B-mode)  L iliac: Stent 314-438 c/s (Vr > 3.5)  Patent fem-fem BPG   Medical Decision Making   Jeffrey Stout is a 67 y.o. male who presents with:  S/p L CIA and EIA PTA+S, L CIA DCB PTA, L-to-R femorofemoral bypass for R leg intermittent claudication without evidence of critical limb ischemia. L  RA PTA+S, distal aortic stenosis   The aortic cross section is worrisome, so I would get a CTA abd/pelvis to re-evaluate the distal aorta.  In the event there is a hemodynamic stenosis, this may need to be stented.  The CTA should also re-evaluate the L CIA stents.  The patient will follow up in 1-2 weeks with this study.  I discussed in depth with the patient the nature of atherosclerosis, and emphasized the importance of maximal medical management including strict control of blood pressure, blood glucose, and lipid levels, antiplatelet agents, obtaining regular exercise, and cessation of smoking.    The patient is aware that without maximal medical management the underlying  atherosclerotic disease process will progress, limiting the benefit of any interventions.  The patient is currently on a statin: Pravachol.   The patient is currently on an anti-platelet: Plavix.  Thank you for allowing [place] to participate in this patient's care.   Adele Barthel, MD, FACS [facility] [place] Office: [phone] Pager: [phone]

## 2018-02-02 ENCOUNTER — Ambulatory Visit (INDEPENDENT_AMBULATORY_CARE_PROVIDER_SITE_OTHER): Payer: Medicare Other | Admitting: Vascular Surgery

## 2018-02-02 ENCOUNTER — Encounter: Payer: Self-pay | Admitting: Vascular Surgery

## 2018-02-02 ENCOUNTER — Other Ambulatory Visit: Payer: Self-pay

## 2018-02-02 ENCOUNTER — Ambulatory Visit (HOSPITAL_COMMUNITY)
Admission: RE | Admit: 2018-02-02 | Discharge: 2018-02-02 | Disposition: A | Discharge status: Home / Self Care | Payer: Medicare Other | Source: Ambulatory Visit | Attending: Vascular Surgery | Admitting: Vascular Surgery

## 2018-02-02 VITALS — BP 150/87 | HR 87 | Temp 97.0°F | Ht 75.0 in | Wt 181.0 lb

## 2018-02-02 DIAGNOSIS — I739 Peripheral vascular disease, unspecified: Secondary | ICD-10-CM | POA: Insufficient documentation

## 2018-02-02 DIAGNOSIS — I745 Embolism and thrombosis of iliac artery: Secondary | ICD-10-CM | POA: Diagnosis not present

## 2018-02-02 DIAGNOSIS — I10 Essential (primary) hypertension: Secondary | ICD-10-CM | POA: Diagnosis not present

## 2018-02-02 DIAGNOSIS — I708 Atherosclerosis of other arteries: Secondary | ICD-10-CM | POA: Insufficient documentation

## 2018-02-02 DIAGNOSIS — F17200 Nicotine dependence, unspecified, uncomplicated: Secondary | ICD-10-CM | POA: Insufficient documentation

## 2018-02-02 DIAGNOSIS — I701 Atherosclerosis of renal artery: Secondary | ICD-10-CM

## 2018-02-02 DIAGNOSIS — I700 Atherosclerosis of aorta: Secondary | ICD-10-CM | POA: Insufficient documentation

## 2018-02-02 DIAGNOSIS — I70211 Atherosclerosis of native arteries of extremities with intermittent claudication, right leg: Secondary | ICD-10-CM | POA: Diagnosis not present

## 2018-02-02 DIAGNOSIS — I350 Nonrheumatic aortic (valve) stenosis: Secondary | ICD-10-CM

## 2018-02-03 ENCOUNTER — Ambulatory Visit
Admission: RE | Admit: 2018-02-03 | Discharge: 2018-02-03 | Disposition: A | Discharge status: Home / Self Care | Payer: Medicare Other | Source: Ambulatory Visit | Attending: Vascular Surgery | Admitting: Vascular Surgery

## 2018-02-03 DIAGNOSIS — I714 Abdominal aortic aneurysm, without rupture, unspecified: Secondary | ICD-10-CM

## 2018-02-03 DIAGNOSIS — I350 Nonrheumatic aortic (valve) stenosis: Secondary | ICD-10-CM

## 2018-02-03 DIAGNOSIS — I7410 Embolism and thrombosis of unspecified parts of aorta: Secondary | ICD-10-CM

## 2018-02-03 DIAGNOSIS — K5790 Diverticulosis of intestine, part unspecified, without perforation or abscess without bleeding: Secondary | ICD-10-CM

## 2018-02-03 HISTORY — DX: Abdominal aortic aneurysm, without rupture: I71.4

## 2018-02-03 HISTORY — DX: Diverticulosis of intestine, part unspecified, without perforation or abscess without bleeding: K57.90

## 2018-02-03 HISTORY — DX: Abdominal aortic aneurysm, without rupture, unspecified: I71.40

## 2018-02-03 HISTORY — DX: Embolism and thrombosis of unspecified parts of aorta: I74.10

## 2018-02-03 MED ORDER — IOPAMIDOL (ISOVUE-370) INJECTION 76%
75.0000 mL | Freq: Once | INTRAVENOUS | Status: AC | PRN
  Administered 2018-02-03: 75 mL via INTRAVENOUS

## 2018-02-09 ENCOUNTER — Ambulatory Visit (INDEPENDENT_AMBULATORY_CARE_PROVIDER_SITE_OTHER): Payer: Medicare Other | Admitting: Vascular Surgery

## 2018-02-09 ENCOUNTER — Encounter: Payer: Self-pay | Admitting: Vascular Surgery

## 2018-02-09 ENCOUNTER — Other Ambulatory Visit: Payer: Self-pay

## 2018-02-09 VITALS — BP 137/79 | HR 68 | Temp 99.1°F | Resp 16 | Wt 181.2 lb

## 2018-02-09 DIAGNOSIS — I701 Atherosclerosis of renal artery: Secondary | ICD-10-CM

## 2018-02-09 DIAGNOSIS — I745 Embolism and thrombosis of iliac artery: Secondary | ICD-10-CM | POA: Diagnosis not present

## 2018-02-09 DIAGNOSIS — I70211 Atherosclerosis of native arteries of extremities with intermittent claudication, right leg: Secondary | ICD-10-CM | POA: Diagnosis not present

## 2018-02-09 NOTE — Progress Notes (Signed)
Established Previous Bypass   History of Present Illness   Jeffrey Stout is a 67 y.o. (24-Jun-1951) male who presents with chief complaint: none.    Prior procedures include: 1. DCB PTA L CIA stent, PTA+S L distal CIA/proximal EIA (11/12/16) 2. L-to-R femofemoral bypass, R fem EA w/ BPA, limited L fem EA w/ BPA (04/11/14) 3. PTA+S L CIA, L renal PTA+S (02/15/14) fore renovascular HTN and R leg claudication   The patient's symptoms have NOT progressed.  The patient's symptoms are: none. The patient's treatment regimen currently included: maximal medical management.  The patient had a plaque or thrombus in distal aorta that was concerning.  The patient presents today for his CTA results.  The patient's PMH, PSH, SH, and FamHx were reviewed on 02/09/18 are unchanged from 02/02/18.  Current Outpatient Medications  Medication Sig Dispense Refill  . amLODipine (NORVASC) 10 MG tablet Take 10 mg by mouth 2 (two) times daily.     Marland Kitchen aspirin 81 MG tablet Take 81 mg by mouth daily. Reported on 11/01/2015    . benazepril (LOTENSIN) 40 MG tablet Take 40 mg by mouth daily.     . clopidogrel (PLAVIX) 75 MG tablet Take 1 tablet (75 mg total) by mouth daily. 90 tablet 4  . metoprolol (LOPRESSOR) 50 MG tablet Take 50 mg by mouth 2 (two) times daily.   5  . pravastatin (PRAVACHOL) 40 MG tablet Take 40 mg by mouth at bedtime.      No current facility-administered medications for this visit.     On ROS today: no hematuria, no flank pain   Physical Examination   Vitals:   02/09/18 1434  BP: 137/79  Pulse: 68  Resp: 16  Temp: 99.1 F (37.3 C)  TempSrc: Oral  SpO2: 97%  Weight: 181 lb 3.2 oz (82.2 kg)   Body mass index is 22.65 kg/m.  General Alert, O x 3, WD, NAD  Pulmonary Sym exp, good B air movt, CTA B  Cardiac RRR, Nl S1, S2, no Murmurs, No rubs, No S3,S4  Vascular Vessel Right Left  Radial Palpable Palpable  Brachial Palpable Palpable  Carotid Palpable, No Bruit Palpable, No Bruit    Aorta Not palpable N/A  Femoral Palpable Palpable  Popliteal Not palpable Not palpable  PT Palpable Palpable  DP Palpable Palpable    Gastro- intestinal soft, non-distended, non-tender to palpation, No guarding or rebound, no HSM, no masses, no CVAT B, No palpable prominent aortic pulse,    Musculo- skeletal M/S 5/5 throughout  , Extremities without ischemic changes  , No edema present, No visible varicosities , No Lipodermatosclerosis present  Neurologic Pain and light touch intact in extremities , Motor exam as listed above   Radiology     CTA abd/pelvis (02/03/18): IMPRESSION: VASCULAR  1. Progressive mural thrombus in the aorta without high-grade stenosis. 2. 3 cm fusiform abdominal aortic aneurysm. 3. Interval thrombosis of right common iliac artery and placement of patent fem-fem bypass graft. 4. Patent left renal, left common and external iliac arterial stents.  NON-VASCULAR  1. New 2.4 cm right renal mass. Consider further evaluation with dedicated abdominal MRI with contrast to differentiate neoplasm from proteinaceous cyst. 2. Progressive right renal parenchymal atrophy 3. Descending and sigmoid diverticulosis.  Based on my review of the CTA, the patient has extensive mural thrombus.  There appears to be a maximal stenosis of 50% in the perirenal segment.  The only segment that could have replicated the duplex findings are the distal  aorta and right common iliac artery segment.   There appears to be solid mass in the right knee which is worrisome, given the right renal arterial flow is mostly gone.   Medical Decision Making   Jeffrey Stout is a 67 y.o. male who presents with: s/p L CIA and EIA PTA+S, L CIA DCB PTA, L-to-R femorofemoral bypass for R leg intermittent claudication without evidence of critical limb ischemia, s/p L RA PTA+S, mural aortic thrombus, R renal mass   Based on the extensive thrombus load in this aorta, I recommend: 3 month trial of  anticoagulation to see if lysis of complex thrombus in aorta is possible.  However, the R renal mass should be address first in the event biopsy will be needed.  Referral to Urology will be arranged.  Will have patient follow up 1-2 month to give the Urology referral time.  Thank you for allowing [place] to participate in this patient's care.   Adele Barthel, MD, FACS [facility] Office: [phone] Pager: [phone]

## 2018-02-28 ENCOUNTER — Other Ambulatory Visit (HOSPITAL_COMMUNITY): Payer: Self-pay | Admitting: Urology

## 2018-02-28 DIAGNOSIS — D49511 Neoplasm of unspecified behavior of right kidney: Secondary | ICD-10-CM | POA: Diagnosis not present

## 2018-02-28 DIAGNOSIS — D49519 Neoplasm of unspecified behavior of unspecified kidney: Secondary | ICD-10-CM

## 2018-03-02 ENCOUNTER — Other Ambulatory Visit: Payer: Self-pay | Admitting: *Deleted

## 2018-03-02 MED ORDER — CLOPIDOGREL BISULFATE 75 MG PO TABS: 75.0000 mg | ORAL_TABLET | Freq: Every day | ORAL | 4 refills | Status: DC

## 2018-03-07 ENCOUNTER — Ambulatory Visit (HOSPITAL_COMMUNITY)
Admission: RE | Admit: 2018-03-07 | Discharge: 2018-03-07 | Disposition: A | Discharge status: Home / Self Care | Payer: Medicare Other | Source: Ambulatory Visit | Attending: Urology | Admitting: Urology

## 2018-03-07 DIAGNOSIS — N281 Cyst of kidney, acquired: Secondary | ICD-10-CM

## 2018-03-07 DIAGNOSIS — I700 Atherosclerosis of aorta: Secondary | ICD-10-CM

## 2018-03-07 DIAGNOSIS — D49519 Neoplasm of unspecified behavior of unspecified kidney: Secondary | ICD-10-CM

## 2018-03-07 DIAGNOSIS — D49511 Neoplasm of unspecified behavior of right kidney: Secondary | ICD-10-CM | POA: Insufficient documentation

## 2018-03-07 DIAGNOSIS — N2889 Other specified disorders of kidney and ureter: Secondary | ICD-10-CM

## 2018-03-07 DIAGNOSIS — D1779 Benign lipomatous neoplasm of other sites: Secondary | ICD-10-CM

## 2018-03-07 HISTORY — DX: Atherosclerosis of aorta: I70.0

## 2018-03-07 HISTORY — DX: Benign lipomatous neoplasm of other sites: D17.79

## 2018-03-07 HISTORY — DX: Cyst of kidney, acquired: N28.1

## 2018-03-07 HISTORY — DX: Other specified disorders of kidney and ureter: N28.89

## 2018-03-07 LAB — POCT I-STAT CREATININE: Creatinine, Ser: 1.4 mg/dL — ABNORMAL HIGH (ref 0.61–1.24)

## 2018-03-07 MED ORDER — GADOBENATE DIMEGLUMINE 529 MG/ML IV SOLN
20.0000 mL | Freq: Once | INTRAVENOUS | Status: AC | PRN
  Administered 2018-03-07: 17 mL via INTRAVENOUS

## 2018-03-08 NOTE — Progress Notes (Signed)
    Established Previous Bypass   Jeffrey Stout is a 67 y.o. male who presents with:  s/p L CIA & EIA PTA+S, L CIA DCB PTA, L-to-R femfem BPG for R intermittent claudication with CLI, s/p L RA PTA+S for RAS, mural aortic thrombus, R renal mass.  MRI Abdomen (03/07/18): 1. The lesion of concern in the interpolar region of the right kidney measures 2.7 cm and has imaging characteristics compatible with a small neoplasm, likely a renal cell carcinoma. This is encapsulated within Gerota's fascia, does not involve the right renal vein, and is not associated with lymphadenopathy or definite signs of metastatic disease in the abdomen on today's examination. 2. Multiple cysts of varying degrees of complexity in the left kidney ranging from Bosniak class 1 to Bosniak class 2. 3. Small right adrenal myelolipoma measuring 1.9 x 1.5 cm. 4. Severe aortic atherosclerosis.   I spent 15-20 minutes discussing the findings of the MRI and mgmt of his aortic thrombus with the patient.  From a vascular viewpoint, I don't think anticoagulation is necessary at this point, as I suspect most of his aortic thrombus is chronic anyway.  The anticoagulation trial was to see if I could get any regression of wall thrombus, which was unlikely anyway with chronic.  I would proceed with any urologic procedure without anticoagulation other post-procedure DVT prophylaxis.  Will have patient follow up in 3 months with surveillance studies.   Adele Barthel, MD, FACS [facility] Office: [phone] Pager: [phone]

## 2018-03-09 ENCOUNTER — Encounter: Payer: Self-pay | Admitting: Vascular Surgery

## 2018-03-09 ENCOUNTER — Other Ambulatory Visit: Payer: Self-pay

## 2018-03-09 ENCOUNTER — Ambulatory Visit (INDEPENDENT_AMBULATORY_CARE_PROVIDER_SITE_OTHER): Payer: Medicare Other | Admitting: Vascular Surgery

## 2018-03-09 VITALS — BP 133/81 | HR 70 | Resp 20 | Ht 75.0 in | Wt 178.4 lb

## 2018-03-09 DIAGNOSIS — I701 Atherosclerosis of renal artery: Secondary | ICD-10-CM | POA: Diagnosis not present

## 2018-03-09 DIAGNOSIS — C649 Malignant neoplasm of unspecified kidney, except renal pelvis: Secondary | ICD-10-CM | POA: Insufficient documentation

## 2018-03-09 DIAGNOSIS — I745 Embolism and thrombosis of iliac artery: Secondary | ICD-10-CM | POA: Diagnosis not present

## 2018-03-09 DIAGNOSIS — C641 Malignant neoplasm of right kidney, except renal pelvis: Secondary | ICD-10-CM | POA: Diagnosis not present

## 2018-03-10 ENCOUNTER — Other Ambulatory Visit: Payer: Self-pay

## 2018-03-10 DIAGNOSIS — I739 Peripheral vascular disease, unspecified: Secondary | ICD-10-CM

## 2018-03-10 DIAGNOSIS — I745 Embolism and thrombosis of iliac artery: Secondary | ICD-10-CM

## 2018-03-22 ENCOUNTER — Other Ambulatory Visit (HOSPITAL_COMMUNITY): Payer: Self-pay | Admitting: Urology

## 2018-03-22 ENCOUNTER — Ambulatory Visit (HOSPITAL_COMMUNITY)
Admission: RE | Admit: 2018-03-22 | Discharge: 2018-03-22 | Disposition: A | Discharge status: Home / Self Care | Payer: Medicare Other | Source: Ambulatory Visit | Attending: Urology | Admitting: Urology

## 2018-03-22 DIAGNOSIS — R05 Cough: Secondary | ICD-10-CM | POA: Diagnosis not present

## 2018-03-22 DIAGNOSIS — D49511 Neoplasm of unspecified behavior of right kidney: Secondary | ICD-10-CM

## 2018-03-29 ENCOUNTER — Ambulatory Visit (INDEPENDENT_AMBULATORY_CARE_PROVIDER_SITE_OTHER): Payer: Medicare Other | Admitting: Cardiovascular Disease

## 2018-03-29 ENCOUNTER — Encounter: Payer: Self-pay | Admitting: Cardiovascular Disease

## 2018-03-29 VITALS — BP 122/74 | HR 54 | Ht 75.0 in | Wt 180.0 lb

## 2018-03-29 DIAGNOSIS — Z01818 Encounter for other preprocedural examination: Secondary | ICD-10-CM | POA: Diagnosis not present

## 2018-03-29 DIAGNOSIS — I739 Peripheral vascular disease, unspecified: Secondary | ICD-10-CM | POA: Diagnosis not present

## 2018-03-29 DIAGNOSIS — Z79899 Other long term (current) drug therapy: Secondary | ICD-10-CM | POA: Diagnosis not present

## 2018-03-29 LAB — LIPID PANEL
CHOLESTEROL TOTAL: 154 mg/dL (ref 100–199)
Chol/HDL Ratio: 5.5 ratio — ABNORMAL HIGH (ref 0.0–5.0)
HDL: 28 mg/dL — AB (ref >39)
LDL CALC: 71 mg/dL (ref 0–99)
TRIGLYCERIDES: 276 mg/dL — AB (ref 0–149)
VLDL Cholesterol Cal: 55 mg/dL — ABNORMAL HIGH (ref 5–40)

## 2018-03-29 LAB — HEPATIC FUNCTION PANEL
ALBUMIN: 4.6 g/dL (ref 3.6–4.8)
ALT: 34 IU/L (ref 0–44)
AST: 23 IU/L (ref 0–40)
Alkaline Phosphatase: 86 IU/L (ref 39–117)
BILIRUBIN TOTAL: 0.4 mg/dL (ref 0.0–1.2)
Bilirubin, Direct: 0.13 mg/dL (ref 0.00–0.40)
TOTAL PROTEIN: 7.1 g/dL (ref 6.0–8.5)

## 2018-03-29 NOTE — Assessment & Plan Note (Signed)
History of hyperlipidemia on pravastatin. We will recheck a lipid and liver profile 

## 2018-03-29 NOTE — Assessment & Plan Note (Signed)
Jeffrey Stout is going to need partial or complete nephrectomy for what appears to be a hypernephroma.  He is being evaluated by Dr. Raynelle Bring.  He has multiple risk factors including ongoing tobacco abuse of close to 100 pack years smoking 1-1/2 packs a day recalcitrant risk factor modification, treated hypertension hyperlipidemia and known vascular disease.  He did have a Myoview stress test performed 4 years ago for preoperative clearance prior to his endovascular procedures on 03/30/2014 which is low risk as was a 2D echo.  I am going to repeat a pharmacologic Myoview stress test to risk stratify him.

## 2018-03-29 NOTE — Patient Instructions (Signed)
Medication Instructions:  Your physician recommends that you continue on your current medications as directed. Please refer to the Current Medication list given to you today.   Labwork: Your physician recommends that you return for lab work in: TODAY   Testing/Procedures: Your physician has requested that you have a lexiscan myoview. For further information please visit [email or web address]. Please follow instruction sheet, as given.  HOLD: NO medications  Follow-Up: Your physician wants you to follow-up in: 12 months with Dr. Gwenlyn Found. You will receive a reminder letter in the mail two months in advance. If you don't receive a letter, please call our office to schedule the follow-up appointment.   Any Other Special Instructions Will Be Listed Below (If Applicable).     If you need a refill on your cardiac medications before your next appointment, please call your pharmacy.

## 2018-03-29 NOTE — Progress Notes (Signed)
03/29/2018 Jeffrey Stout   [birth year]  [id number]  Primary Physician Jeffrey Laine, MD Primary Cardiologist: Jeffrey Harp MD Jeffrey Stout, [place]  HPI:  Jeffrey Stout is a 67 y.o.  thin appearing married Caucasian male father of 3 children, grandfather 5 grandchildren who worked at [employer] and retired October 2018.Marland Kitchen He was initially referred by Dr. Bridgett Stout for cardiovascular clearance before elective lower extremity surgical revascularization. His carotids are 2+ positive to 75 pack years of tobacco abuse continuing to smoke 1/2 packs/day and recalcitrant risk factor modification.  History of hypertension, hyperlipidemia. His mother also had a history of heart disease. He has never had a heart attack or stroke, denies chest pain or shortness of breath. He had a 2D echo and a Myoview stress test performed in August 2015 for preoperative clearance before his vascular procedures which were unremarkable.  He now apparently has a hypernephroma which required surgical removal and is here for preoperative clearance.  Given his ongoing tobacco abuse as well as his extensive vascular history I am going to repeat a pharmacologic Myoview stress test to risk stratify him.  We have talked about the importance of smoking cessation.  He specifically denies chest pain or shortness of breath.  Current Meds  Medication Sig  . amLODipine (NORVASC) 10 MG tablet Take 10 mg by mouth 2 (two) times daily.   Marland Kitchen aspirin 81 MG tablet Take 81 mg by mouth daily. Reported on 11/01/2015  . benazepril (LOTENSIN) 40 MG tablet Take 40 mg by mouth daily.   . clopidogrel (PLAVIX) 75 MG tablet Take 1 tablet (75 mg total) by mouth daily.  . metoprolol (LOPRESSOR) 50 MG tablet Take 50 mg by mouth 2 (two) times daily.   . pravastatin (PRAVACHOL) 40 MG tablet Take 40 mg by mouth at bedtime.      No Known Allergies  Social History   Socioeconomic History  . Marital status: Married    Spouse name: Not on file    . Number of children: Not on file  . Years of education: Not on file  . Highest education level: Not on file  Occupational History  . Occupation: retired  Scientific laboratory technician  . Financial resource strain: Not on file  . Food insecurity:    Worry: Not on file    Inability: Not on file  . Transportation needs:    Medical: Not on file    Non-medical: Not on file  Tobacco Use  . Smoking status: Current Every Day Smoker    Packs/day: 1.50    Years: 45.00    Pack years: 67.50    Types: Cigarettes  . Smokeless tobacco: Never Used  Substance and Sexual Activity  . Alcohol use: No    Alcohol/week: 0.0 oz  . Drug use: No  . Sexual activity: Not on file  Lifestyle  . Physical activity:    Days per week: Not on file    Minutes per session: Not on file  . Stress: Not on file  Relationships  . Social connections:    Talks on phone: Not on file    Gets together: Not on file    Attends religious service: Not on file    Active member of club or organization: Not on file    Attends meetings of clubs or organizations: Not on file    Relationship status: Not on file  . Intimate partner violence:    Fear of current or ex partner: Not on file  Emotionally abused: Not on file    Physically abused: Not on file    Forced sexual activity: Not on file  Other Topics Concern  . Not on file  Social History Narrative  . Not on file     Review of Systems: General: negative for chills, fever, night sweats or weight changes.  Cardiovascular: negative for chest pain, dyspnea on exertion, edema, orthopnea, palpitations, paroxysmal nocturnal dyspnea or shortness of breath Dermatological: negative for rash Respiratory: negative for cough or wheezing Urologic: negative for hematuria Abdominal: negative for nausea, vomiting, diarrhea, bright red blood per rectum, melena, or hematemesis Neurologic: negative for visual changes, syncope, or dizziness All other systems reviewed and are otherwise negative  except as noted above.    Blood pressure 122/74, pulse (!) 54, height 6\' 3"  (1.905 m), weight 180 lb (81.6 kg).  General appearance: alert and no distress Neck: no adenopathy, no carotid bruit, no JVD, supple, symmetrical, trachea midline and thyroid not enlarged, symmetric, no tenderness/mass/nodules Lungs: clear to auscultation bilaterally Heart: regular rate and rhythm, S1, S2 normal, no murmur, click, rub or gallop Extremities: extremities normal, atraumatic, no cyanosis or edema Pulses: 2+ and symmetric Skin: Skin color, texture, turgor normal. No rashes or lesions Neurologic: Alert and oriented X 3, normal strength and tone. Normal symmetric reflexes. Normal coordination and gait  EKG sinus bradycardia 54 without ST or T wave changes.  I personally reviewed this EKG.  ASSESSMENT AND PLAN:   Unspecified essential hypertension History of essential hypertension her blood pressure measured today at 122/74.  He is on amlodipine and benazepril as well as metoprolol.  Continue current meds at current dosing.  Atherosclerosis of native arteries of extremity with intermittent claudication (HCC) History of multiple endovascular procedures performed by Dr. Bridgett Stout  Hyperlipidemia History of hyperlipidemia on pravastatin.  We will recheck a lipid and liver profile.  Preoperative clearance Mr. Hehn is going to need partial or complete nephrectomy for what appears to be a hypernephroma.  He is being evaluated by Dr. Raynelle Stout.  He has multiple risk factors including ongoing tobacco abuse of close to 100 pack years smoking 1-1/2 packs a day recalcitrant risk factor modification, treated hypertension hyperlipidemia and known vascular disease.  He did have a Myoview stress test performed 4 years ago for preoperative clearance prior to his endovascular procedures on 03/30/2014 which is low risk as was a 2D echo.  I am going to repeat a pharmacologic Myoview stress test to risk stratify  him.      Jeffrey Harp MD FACP,FACC,FAHA, [facility] 03/29/2018 8:44 AM

## 2018-03-29 NOTE — Assessment & Plan Note (Signed)
History of multiple endovascular procedures performed by Dr. Bridgett Larsson

## 2018-03-29 NOTE — Assessment & Plan Note (Signed)
History of essential hypertension her blood pressure measured today at 122/74.  He is on amlodipine and benazepril as well as metoprolol.  Continue current meds at current dosing.

## 2018-03-30 ENCOUNTER — Encounter: Payer: Self-pay | Admitting: *Deleted

## 2018-03-30 ENCOUNTER — Telehealth: Payer: Self-pay | Admitting: Cardiovascular Disease

## 2018-03-30 ENCOUNTER — Telehealth (HOSPITAL_COMMUNITY): Payer: Self-pay

## 2018-03-30 NOTE — Telephone Encounter (Signed)
New Message:          [facility] Pre-operative Risk Assessment    Request for surgical clearance:  1. What type of surgery is being performed? Right laparoscopic radical nephrectomy  2. When is this surgery scheduled? TBD   3. What type of clearance is required (medical clearance vs. Pharmacy clearance to hold med vs. Both)? Both  4. Are there any medications that need to be held prior to surgery and how long?clopidogrel (PLAVIX) 75 MG tablet need to be held 5 days and can continue with aspirin  5. Practice name and name of physician performing surgery? Dr. Alinda Money [facility]  6. What is your office phone number [phone] ext [phone]    7.   What is your office fax number [phone]  8.   Anesthesia type (None, local, MAC, general) ? General   Jeffrey Stout 03/30/2018, 3:25 PM  _________________________________________________________________   (provider comments below)

## 2018-03-30 NOTE — Telephone Encounter (Signed)
Encounter complete. 

## 2018-03-31 ENCOUNTER — Telehealth (HOSPITAL_COMMUNITY): Payer: Self-pay

## 2018-03-31 NOTE — Telephone Encounter (Signed)
Encounter complete. 

## 2018-04-01 ENCOUNTER — Ambulatory Visit (HOSPITAL_COMMUNITY)
Admission: RE | Admit: 2018-04-01 | Discharge: 2018-04-01 | Disposition: A | Discharge status: Home / Self Care | Payer: Medicare Other | Source: Ambulatory Visit | Attending: Cardiology | Admitting: Cardiology

## 2018-04-01 DIAGNOSIS — Z01818 Encounter for other preprocedural examination: Secondary | ICD-10-CM

## 2018-04-01 DIAGNOSIS — Z79899 Other long term (current) drug therapy: Secondary | ICD-10-CM | POA: Diagnosis not present

## 2018-04-01 DIAGNOSIS — I739 Peripheral vascular disease, unspecified: Secondary | ICD-10-CM | POA: Insufficient documentation

## 2018-04-01 LAB — MYOCARDIAL PERFUSION IMAGING
CHL CUP RESTING HR STRESS: 48 {beats}/min
LVDIAVOL: 122 mL (ref 62–150)
LVSYSVOL: 62 mL
Peak HR: 77 {beats}/min
SDS: 0
SRS: 1
SSS: 1
TID: 1.1

## 2018-04-01 MED ORDER — TECHNETIUM TC 99M TETROFOSMIN IV KIT
10.3000 | PACK | Freq: Once | INTRAVENOUS | Status: AC | PRN
  Administered 2018-04-01: 10.3 via INTRAVENOUS
  Filled 2018-04-01: qty 11

## 2018-04-01 MED ORDER — TECHNETIUM TC 99M TETROFOSMIN IV KIT
31.8000 | PACK | Freq: Once | INTRAVENOUS | Status: AC | PRN
  Administered 2018-04-01: 31.8 via INTRAVENOUS
  Filled 2018-04-01: qty 32

## 2018-04-01 MED ORDER — REGADENOSON 0.4 MG/5ML IV SOLN
0.4000 mg | Freq: Once | INTRAVENOUS | Status: AC
  Administered 2018-04-01: 0.4 mg via INTRAVENOUS

## 2018-04-04 NOTE — Telephone Encounter (Signed)
Noted Myoview done as part of preop clearance which was satisfactory.   Dr. Gwenlyn Found will need to ok holding the Plavix for 5 days (to continue aspirin).  Dr. Gwenlyn Found - please route your response back to CV DIV PREOP pool.   Burtis Junes, RN, [facility] [address] [place], [place]  [address] [phone]

## 2018-04-05 NOTE — Telephone Encounter (Signed)
Low risk Myoview, okay to hold antiplatelet agents for his operative procedure.

## 2018-04-05 NOTE — Telephone Encounter (Signed)
   Primary Cardiologist: Dr Gwenlyn Found  Chart reviewed as part of pre-operative protocol coverage. Given past medical history and time since last visit, based on ACC/AHA guidelines, Jeffrey Stout would be at acceptable risk for the planned procedure without further cardiovascular testing.   Ok to hold Plavix 5 days pre op if needed.   I will route this recommendation to the requesting party via Epic fax function and remove from pre-op pool.  Please call with questions.  Kerin Ransom, PA-C 04/05/2018, 3:08 PM

## 2018-04-06 ENCOUNTER — Other Ambulatory Visit: Payer: Self-pay | Admitting: *Deleted

## 2018-04-08 ENCOUNTER — Other Ambulatory Visit: Payer: Self-pay | Admitting: Urology

## 2018-04-12 ENCOUNTER — Encounter: Payer: Self-pay | Admitting: *Deleted

## 2018-05-16 ENCOUNTER — Encounter (HOSPITAL_COMMUNITY): Payer: Self-pay

## 2018-05-16 NOTE — Patient Instructions (Addendum)
Your procedure is scheduled on: Thursday, Oct. 3, 2019   Surgery Time:  1:30PM-5:00PM   Report to [place]  Entrance    Report to admitting at 11:30 AM   Call this number if you have problems the morning of surgery [phone]   Magnesium Citrate:  8 OZ OF MAGNESIUM CITRATE AT NOON DAY BEFORE SURGERY   Fleet enema:  ONE ENEMA NIGHT BEFORE SURGERY   Do not eat food:After Midnight.   May have liquids until 7:30AM      CLEAR LIQUID DIET   Foods Allowed                                                                     Foods Excluded  Water, Coffee and tea, regular and decaf                             liquids that you cannot  Plain Jell-O in any flavor                                             see through such as: Fruit ices (not with fruit pulp)                                     milk, soups, orange juice  Iced Popsicles                                    All solid food Carbonated beverages, regular and diet                                    Cranberry, grape and apple juices Sports drinks like Gatorade Lightly seasoned clear broth or consume(fat free) Sugar, honey syrup  Sample Menu Breakfast                                Lunch                                     Supper Cranberry juice                    Beef broth                            Chicken broth Jell-O                                     Grape juice  Apple juice Coffee or tea                        Jell-O                                      Popsicle                                                Coffee or tea                        Coffee or tea   Brush your teeth the morning of surgery.   Do NOT smoke after Midnight   Take these medicines the morning of surgery with A SIP OF WATER: Amlodipine, Metoprolol                               You may not have any metal on your body including jewelry, and body piercings             Do not wear lotions, powders,  perfumes/cologne, or deodorant                          Men may shave face and neck.   Do not bring valuables to the hospital. [place].   Contacts, dentures or bridgework may not be worn into surgery.   Leave suitcase in the car. After surgery it may be brought to your room.    Special Instructions: Bring a copy of your healthcare power of attorney and living will documents         the day of surgery if you haven't scanned them in before.              Please read over the following fact sheets you were given:  [facility] - Preparing for Surgery Before surgery, you can play an important role.  Because skin is not sterile, your skin needs to be as free of germs as possible.  You can reduce the number of germs on your skin by washing with CHG (chlorahexidine gluconate) soap before surgery.  CHG is an antiseptic cleaner which kills germs and bonds with the skin to continue killing germs even after washing. Please DO NOT use if you have an allergy to CHG or antibacterial soaps.  If your skin becomes reddened/irritated stop using the CHG and inform your nurse when you arrive at Short Stay. Do not shave (including legs and underarms) for at least 48 hours prior to the first CHG shower.  You may shave your face/neck.  Please follow these instructions carefully:  1.  Shower with CHG Soap the night before surgery and the  morning of surgery.  2.  If you choose to wash your hair, wash your hair first as usual with your normal  shampoo.  3.  After you shampoo, rinse your hair and body thoroughly to remove the shampoo.  4.  Use CHG as you would any other liquid soap.  You can apply chg directly to the skin and wash.  Gently with a scrungie or clean washcloth.  5.  Apply the CHG Soap to your body ONLY FROM THE NECK DOWN.   Do   not use on face/ open                           Wound or open sores. Avoid contact with eyes, ears  mouth and   genitals (private parts).                       Wash face,  Genitals (private parts) with your normal soap.             6.  Wash thoroughly, paying special attention to the area where your    surgery  will be performed.  7.  Thoroughly rinse your body with warm water from the neck down.  8.  DO NOT shower/wash with your normal soap after using and rinsing off the CHG Soap.                9.  Pat yourself dry with a clean towel.            10.  Wear clean pajamas.            11.  Place clean sheets on your bed the night of your first shower and do not  sleep with pets. Day of Surgery : Do not apply any lotions/deodorants the morning of surgery.  Please wear clean clothes to the hospital/surgery center.  FAILURE TO FOLLOW THESE INSTRUCTIONS MAY RESULT IN THE CANCELLATION OF YOUR SURGERY  PATIENT SIGNATURE_________________________________  NURSE SIGNATURE__________________________________  ________________________________________________________________________   Adam Phenix  An incentive spirometer is a tool that can help keep your lungs clear and active. This tool measures how well you are filling your lungs with each breath. Taking long deep breaths may help reverse or decrease the chance of developing breathing (pulmonary) problems (especially infection) following:  A long period of time when you are unable to move or be active. BEFORE THE PROCEDURE   If the spirometer includes an indicator to show your best effort, your nurse or respiratory therapist will set it to a desired goal.  If possible, sit up straight or lean slightly forward. Try not to slouch.  Hold the incentive spirometer in an upright position. INSTRUCTIONS FOR USE  1. Sit on the edge of your bed if possible, or sit up as far as you can in bed or on a chair. 2. Hold the incentive spirometer in an upright position. 3. Breathe out normally. 4. Place the mouthpiece in your mouth and seal your lips  tightly around it. 5. Breathe in slowly and as deeply as possible, raising the piston or the ball toward the top of the column. 6. Hold your breath for 3-5 seconds or for as long as possible. Allow the piston or ball to fall to the bottom of the column. 7. Remove the mouthpiece from your mouth and breathe out normally. 8. Rest for a few seconds and repeat Steps 1 through 7 at least 10 times every 1-2 hours when you are awake. Take your time and take a few normal breaths between deep breaths. 9. The spirometer may include an indicator to show your best effort. Use the indicator as a goal to work toward during each  repetition. 10. After each set of 10 deep breaths, practice coughing to be sure your lungs are clear. If you have an incision (the cut made at the time of surgery), support your incision when coughing by placing a pillow or rolled up towels firmly against it. Once you are able to get out of bed, walk around indoors and cough well. You may stop using the incentive spirometer when instructed by your caregiver.  RISKS AND COMPLICATIONS  Take your time so you do not get dizzy or light-headed.  If you are in pain, you may need to take or ask for pain medication before doing incentive spirometry. It is harder to take a deep breath if you are having pain. AFTER USE  Rest and breathe slowly and easily.  It can be helpful to keep track of a log of your progress. Your caregiver can provide you with a simple table to help with this. If you are using the spirometer at home, follow these instructions: [place] IF:   You are having difficultly using the spirometer.  You have trouble using the spirometer as often as instructed.  Your pain medication is not giving enough relief while using the spirometer.  You develop fever of 100.5 F (38.1 C) or higher. SEEK IMMEDIATE MEDICAL CARE IF:   You cough up bloody sputum that had not been present before.  You develop fever of 102 F  (38.9 C) or greater.  You develop worsening pain at or near the incision site. MAKE SURE YOU:   Understand these instructions.  Will watch your condition.  Will get help right away if you are not doing well or get worse. Document Released: 12/21/2006 Document Revised: 11/02/2011 Document Reviewed: 02/21/2007 ExitCare Patient Information 2014 ExitCare, [place].   ________________________________________________________________________  WHAT IS A BLOOD TRANSFUSION? Blood Transfusion Information  A transfusion is the replacement of blood or some of its parts. Blood is made up of multiple cells which provide different functions.  Red blood cells carry oxygen and are used for blood loss replacement.  White blood cells fight against infection.  Platelets control bleeding.  Plasma helps clot blood.  Other blood products are available for specialized needs, such as hemophilia or other clotting disorders. BEFORE THE TRANSFUSION  Who gives blood for transfusions?   Healthy volunteers who are fully evaluated to make sure their blood is safe. This is blood bank blood. Transfusion therapy is the safest it has ever been in the practice of medicine. Before blood is taken from a donor, a complete history is taken to make sure that person has no history of diseases nor engages in risky social behavior (examples are intravenous drug use or sexual activity with multiple partners). The donor's travel history is screened to minimize risk of transmitting infections, such as malaria. The donated blood is tested for signs of infectious diseases, such as HIV and hepatitis. The blood is then tested to be sure it is compatible with you in order to minimize the chance of a transfusion reaction. If you or a relative donates blood, this is often done in anticipation of surgery and is not appropriate for emergency situations. It takes many days to process the donated blood. RISKS AND COMPLICATIONS Although  transfusion therapy is very safe and saves many lives, the main dangers of transfusion include:   Getting an infectious disease.  Developing a transfusion reaction. This is an allergic reaction to something in the blood you were given. Every precaution is taken to prevent this.  The decision to have a blood transfusion has been considered carefully by your caregiver before blood is given. Blood is not given unless the benefits outweigh the risks. AFTER THE TRANSFUSION  Right after receiving a blood transfusion, you will usually feel much better and more energetic. This is especially true if your red blood cells have gotten low (anemic). The transfusion raises the level of the red blood cells which carry oxygen, and this usually causes an energy increase.  The nurse administering the transfusion will monitor you carefully for complications. HOME CARE INSTRUCTIONS  No special instructions are needed after a transfusion. You may find your energy is better. Speak with your caregiver about any limitations on activity for underlying diseases you may have. SEEK MEDICAL CARE IF:   Your condition is not improving after your transfusion.  You develop redness or irritation at the intravenous (IV) site. SEEK IMMEDIATE MEDICAL CARE IF:  Any of the following symptoms occur over the next 12 hours:  Shaking chills.  You have a temperature by mouth above 102 F (38.9 C), not controlled by medicine.  Chest, back, or muscle pain.  People around you feel you are not acting correctly or are confused.  Shortness of breath or difficulty breathing.  Dizziness and fainting.  You get a rash or develop hives.  You have a decrease in urine output.  Your urine turns a dark color or changes to pink, red, or brown. Any of the following symptoms occur over the next 10 days:  You have a temperature by mouth above 102 F (38.9 C), not controlled by medicine.  Shortness of breath.  Weakness after normal  activity.  The white part of the eye turns yellow (jaundice).  You have a decrease in the amount of urine or are urinating less often.  Your urine turns a dark color or changes to pink, red, or brown. Document Released: 08/07/2000 Document Revised: 11/02/2011 Document Reviewed: 03/26/2008 [facility] Patient Information 2014 [place], [place].  _______________________________________________________________________

## 2018-05-16 NOTE — Pre-Procedure Instructions (Signed)
The following are in epic: Telephone encounter cardiac clearance 03/30/2018 Last office visit note 03/29/18  Dr. Gwenlyn Found Last office visit note 03/09/18 Dr. Bridgett Larsson EKG 03/29/18 Stress test 04/01/18 CXR 03/22/18

## 2018-05-17 ENCOUNTER — Encounter (HOSPITAL_COMMUNITY): Payer: Self-pay

## 2018-05-19 ENCOUNTER — Encounter (HOSPITAL_COMMUNITY): Payer: Self-pay

## 2018-05-19 ENCOUNTER — Other Ambulatory Visit: Payer: Self-pay

## 2018-05-19 ENCOUNTER — Encounter (HOSPITAL_COMMUNITY)
Admission: RE | Admit: 2018-05-19 | Discharge: 2018-05-19 | Disposition: A | Discharge status: Home / Self Care | Payer: Medicare Other | Source: Ambulatory Visit | Attending: Urology | Admitting: Urology

## 2018-05-19 DIAGNOSIS — Z01812 Encounter for preprocedural laboratory examination: Secondary | ICD-10-CM | POA: Insufficient documentation

## 2018-05-19 HISTORY — DX: Other specified disorders of kidney and ureter: N28.89

## 2018-05-19 HISTORY — DX: Abdominal aortic aneurysm, without rupture: I71.4

## 2018-05-19 HISTORY — DX: Cardiomegaly: I51.7

## 2018-05-19 HISTORY — DX: Benign lipomatous neoplasm of other sites: D17.79

## 2018-05-19 HISTORY — DX: Peripheral vascular disease, unspecified: I73.9

## 2018-05-19 HISTORY — DX: Atherosclerosis of aorta: I70.0

## 2018-05-19 HISTORY — DX: Diverticulosis of intestine, part unspecified, without perforation or abscess without bleeding: K57.90

## 2018-05-19 HISTORY — DX: Heart disease, unspecified: I51.9

## 2018-05-19 HISTORY — DX: Cyst of kidney, acquired: N28.1

## 2018-05-19 HISTORY — DX: Embolism and thrombosis of unspecified parts of aorta: I74.10

## 2018-05-19 LAB — BASIC METABOLIC PANEL
ANION GAP: 7 (ref 5–15)
BUN: 13 mg/dL (ref 8–23)
CHLORIDE: 106 mmol/L (ref 98–111)
CO2: 27 mmol/L (ref 22–32)
Calcium: 9.4 mg/dL (ref 8.9–10.3)
Creatinine, Ser: 1.52 mg/dL — ABNORMAL HIGH (ref 0.61–1.24)
GFR calc non Af Amer: 45 mL/min — ABNORMAL LOW (ref >60)
GFR, EST AFRICAN AMERICAN: 53 mL/min — AB (ref >60)
Glucose, Bld: 119 mg/dL — ABNORMAL HIGH (ref 70–99)
POTASSIUM: 4.5 mmol/L (ref 3.5–5.1)
Sodium: 140 mmol/L (ref 135–145)

## 2018-05-19 LAB — CBC
HEMATOCRIT: 53.8 % — AB (ref 39.0–52.0)
Hemoglobin: 18.8 g/dL — ABNORMAL HIGH (ref 13.0–17.0)
MCH: 33.1 pg (ref 26.0–34.0)
MCHC: 34.9 g/dL (ref 30.0–36.0)
MCV: 94.7 fL (ref 78.0–100.0)
Platelets: 165 10*3/uL (ref 150–400)
RBC: 5.68 MIL/uL (ref 4.22–5.81)
RDW: 12.8 % (ref 11.5–15.5)
WBC: 8 10*3/uL (ref 4.0–10.5)

## 2018-05-19 LAB — ABO/RH: ABO/RH(D): O POS

## 2018-05-19 MED ORDER — MAGNESIUM CITRATE PO SOLN
1.0000 | Freq: Once | ORAL | Status: DC
  Filled 2018-05-19: qty 296

## 2018-05-19 MED ORDER — FLEET ENEMA 7-19 GM/118ML RE ENEM
1.0000 | ENEMA | Freq: Once | RECTAL | Status: DC
  Filled 2018-05-19: qty 1

## 2018-05-19 NOTE — Pre-Procedure Instructions (Signed)
CBC and BMP results 05/19/2018 faxed to Dr. Alinda Money via epic.

## 2018-05-20 DIAGNOSIS — N3941 Urge incontinence: Secondary | ICD-10-CM | POA: Diagnosis not present

## 2018-05-20 DIAGNOSIS — D49511 Neoplasm of unspecified behavior of right kidney: Secondary | ICD-10-CM | POA: Diagnosis not present

## 2018-05-25 NOTE — H&P (Signed)
Office Visit Report     05/20/2018   --------------------------------------------------------------------------------   Jeffrey Stout  MRN: [id number]  PRIMARY CARE:    DOB: [birth year], 67 year old Male  REFERRING:  Brian L. Bridgett Larsson, MD  SSN:   PROVIDER:  Raynelle Bring, M.D.    TREATING:  Jiles Crocker    LOCATION:  [facility] [phone]   --------------------------------------------------------------------------------   CC/HPI: Right renal neoplasm   Jeffrey Stout is a 67 year old gentleman seen at the request of Dr. Adele Barthel for an incidentally noted right renal neoplasm during a recent CT angiogram. He has a longstanding history of peripheral vascular disease and has undergone multiple procedures including a percutaneous left common iliac stent and proximal left external iliac stent in March 2018, a left to right femoral-femoral bypass in August 2015, and percutaneous angioplasty and stent the both the common iliac artery and left renal artery in June 2015 for renovascular hypertension and right lower extremity claudication. He recently underwent routine follow-up with an ultrasound of the abdomen home suggesting abnormality of the aorta. A subsequent CT angiogram of the abdomen and pelvis was performed. This confirmed significant mural thrombus of the aorta. Incidentally, he was also noted to have a 2.4 cm hyperdense renal mass with a severely atrophic right kidney. He has denied any hematuria. He has no family history of renal cell carcinoma. His Cr is 1.4 (eGFR 58 ml/min) and LFTs are normal.   His past medical history is significant for severe peripheral vascular disease. He also has a history of hypertension and hyperlipidemia. He has undergone a prior laparoscopic converted to open cholecystectomy in 1982. He does have a large right subcostal incision. He is apparently followed by Dr. Erling Cruz for chronic kidney disease. He does not have a regular cardiologist. He  denies significant worsening dyspnea and denies any recent chest pain. According to the patient, he last underwent a cardiac evaluation a few years ago prior to his femoral bypass. He also has a longstanding smoking history. He smokes 1 and half packs per day and has done this for over 50 years.   03/22/18: Interval history:   He returns today after undergoing a dedicated MRI of the abdomen with and without IV contrast. In addition, I have spoken with Dr. Bridgett Larsson who believes that his aortic thrombus is chronic and does not require urgent treatment and that the risk of embolic disease would be low and does not require urgent intervention or anticoagulation. He did undergo a definitive MRI with and without contrast prior to his visit today. He denies any other changes with his overall health since his last visit.   05/20/18: Presents today for preoperative appointment prior to undergoing robotic right nephrectomy on 10/3 with Dr. Alinda Money. Denies any new problems with lower urinary tract symptoms including absence of turning or painful urination, gross hematuria. He has not had recent fevers or antimicrobial treatment for infection. Denies new medication changes.     ALLERGIES: None   MEDICATIONS: Metoprolol Tartrate  Amlodipine Besylate  Benazepril Hcl  Clopidogrel  Pravastatin Sodium     GU PSH: None   NON-GU PSH: Cholecystectomy (open)    GU PMH: Right renal neoplasm - 02/28/2018    NON-GU PMH: GERD Hypercholesterolemia Hypertension    FAMILY HISTORY: 3 Son's - Runs in Family   SOCIAL HISTORY: Marital Status: Married Preferred Language: English; Ethnicity: Not Hispanic Or Latino; Race: White Current Smoking Status: Patient smokes. Has smoked since 02/22/1964.   Tobacco  Use Assessment Completed: Used Tobacco in last 30 days? Does not drink anymore.  Drinks 2 caffeinated drinks per day.    REVIEW OF SYSTEMS:    GU Review Male:   Patient reports frequent urination. Patient denies  hard to postpone urination, burning/ pain with urination, get up at night to urinate, leakage of urine, stream starts and stops, trouble starting your streams, and have to strain to urinate .  Gastrointestinal (Upper):   Patient denies nausea and vomiting.  Gastrointestinal (Lower):   Patient denies diarrhea and constipation.  Constitutional:   Patient denies fever, night sweats, weight loss, and fatigue.  Skin:   Patient denies skin rash/ lesion and itching.  Eyes:   Patient denies blurred vision and double vision.  Ears/ Nose/ Throat:   Patient denies sore throat and sinus problems.  Hematologic/Lymphatic:   Patient reports easy bruising. Patient denies swollen glands.  Cardiovascular:   Patient denies leg swelling and chest pains.  Respiratory:   Patient denies cough and shortness of breath.  Endocrine:   Patient denies excessive thirst.  Musculoskeletal:   Patient denies back pain and joint pain.  Neurological:   Patient denies headaches and dizziness.  Psychologic:   Patient denies depression and anxiety.   Notes: Reviewed previous review of systems 02/28/2018. No changes.   VITAL SIGNS:      05/20/2018 04:33 PM  BP 125/80 mmHg  Pulse 69 /min  Temperature 97.9 F / 36.6 C   MULTI-SYSTEM PHYSICAL EXAMINATION:    Constitutional: Well-nourished. No physical deformities. Normally developed. Good grooming.  Neck: Neck symmetrical, not swollen. Normal tracheal position.  Respiratory: No labored breathing, no use of accessory muscles. Clear bilaterally.  Cardiovascular: Normal temperature, normal extremity pulses, no swelling, no varicosities. Regular rate and rhythm.  Skin: No paleness, no jaundice, no cyanosis. No lesion, no ulcer, no rash.  Neurologic / Psychiatric: Oriented to time, oriented to place, oriented to person. No depression, no anxiety, no agitation.  Gastrointestinal: No mass, no tenderness, no rigidity, non obese abdomen. He has a large right sub costal incision that is  well-healed.  Musculoskeletal: Normal gait and station of head and neck.     PAST DATA REVIEWED:  Source Of History:  Patient  Records Review:   Previous Patient Records  Urine Test Review:   Urinalysis   05/20/18  Urinalysis  Urine Appearance Clear   Urine Color Yellow   Urine Glucose Neg mg/dL  Urine Bilirubin Neg mg/dL  Urine Ketones Neg mg/dL  Urine Specific Gravity 1.015   Urine Blood Neg ery/uL  Urine pH 6.0   Urine Protein Neg mg/dL  Urine Urobilinogen 0.2 mg/dL  Urine Nitrites Neg   Urine Leukocyte Esterase Neg leu/uL   PROCEDURES:          Urinalysis - 81003 Dipstick Dipstick Cont'd  Color: Yellow Bilirubin: Neg  Appearance: Clear Ketones: Neg  Specific Gravity: 1.015 Blood: Neg  pH: 6.0 Protein: Neg  Glucose: Neg Urobilinogen: 0.2    Nitrites: Neg    Leukocyte Esterase: Neg    Notes:      ASSESSMENT:      ICD-10 Details  1 GU:   Right renal neoplasm - D49.511    PLAN:           Orders Labs Urine Culture          Schedule Return Visit/Planned Activity: Keep Scheduled Appointment - Follow up MD          Document Letter(s):  Created for Patient: Clinical  Summary         Notes:   All questions answered about upcoming procedure to the best of my ability with understanding expressed by the patient and his wife. Urinalysis sent for preprocedural baseline culture and sensitivity. He'll keep scheduled follow-up surgical procedure for right nephrectomy on 10/3.        Next Appointment:      Next Appointment: 05/26/2018 01:30 PM    Appointment Type: Surgery     Location: [facility] [phone]    Provider: Raynelle Bring, M.D.    Reason for Visit: WL/OP RIGHT LAP RAD NEPHRECTOMY VS OPEN      * Signed by Jiles Crocker on 05/20/18 at 5:29 PM (EDT*

## 2018-05-26 ENCOUNTER — Inpatient Hospital Stay (HOSPITAL_COMMUNITY): Payer: Medicare Other | Admitting: Certified Registered"

## 2018-05-26 ENCOUNTER — Inpatient Hospital Stay (HOSPITAL_COMMUNITY)
Admission: RE | Admit: 2018-05-26 | Discharge: 2018-05-27 | DRG: 657 | Disposition: A | Discharge status: Home / Self Care | Payer: Medicare Other | Attending: Urology | Admitting: Urology

## 2018-05-26 ENCOUNTER — Encounter (HOSPITAL_COMMUNITY)
Admission: RE | Disposition: A | Discharge status: Home / Self Care | Payer: Self-pay | Source: Home / Self Care | Attending: Urology

## 2018-05-26 ENCOUNTER — Encounter (HOSPITAL_COMMUNITY): Payer: Self-pay | Admitting: *Deleted

## 2018-05-26 ENCOUNTER — Other Ambulatory Visit: Payer: Self-pay

## 2018-05-26 DIAGNOSIS — Z960 Presence of urogenital implants: Secondary | ICD-10-CM | POA: Diagnosis present

## 2018-05-26 DIAGNOSIS — C641 Malignant neoplasm of right kidney, except renal pelvis: Secondary | ICD-10-CM | POA: Diagnosis not present

## 2018-05-26 DIAGNOSIS — I10 Essential (primary) hypertension: Secondary | ICD-10-CM | POA: Diagnosis present

## 2018-05-26 DIAGNOSIS — K660 Peritoneal adhesions (postprocedural) (postinfection): Secondary | ICD-10-CM | POA: Diagnosis present

## 2018-05-26 DIAGNOSIS — I7410 Embolism and thrombosis of unspecified parts of aorta: Secondary | ICD-10-CM | POA: Diagnosis present

## 2018-05-26 DIAGNOSIS — D49511 Neoplasm of unspecified behavior of right kidney: Secondary | ICD-10-CM | POA: Diagnosis present

## 2018-05-26 DIAGNOSIS — N261 Atrophy of kidney (terminal): Secondary | ICD-10-CM | POA: Diagnosis present

## 2018-05-26 DIAGNOSIS — I739 Peripheral vascular disease, unspecified: Secondary | ICD-10-CM | POA: Diagnosis present

## 2018-05-26 DIAGNOSIS — F17210 Nicotine dependence, cigarettes, uncomplicated: Secondary | ICD-10-CM | POA: Diagnosis present

## 2018-05-26 DIAGNOSIS — Z9049 Acquired absence of other specified parts of digestive tract: Secondary | ICD-10-CM | POA: Diagnosis not present

## 2018-05-26 DIAGNOSIS — E7800 Pure hypercholesterolemia, unspecified: Secondary | ICD-10-CM | POA: Diagnosis present

## 2018-05-26 DIAGNOSIS — N2889 Other specified disorders of kidney and ureter: Secondary | ICD-10-CM | POA: Diagnosis present

## 2018-05-26 DIAGNOSIS — K219 Gastro-esophageal reflux disease without esophagitis: Secondary | ICD-10-CM | POA: Diagnosis present

## 2018-05-26 DIAGNOSIS — I714 Abdominal aortic aneurysm, without rupture: Secondary | ICD-10-CM | POA: Diagnosis present

## 2018-05-26 DIAGNOSIS — E785 Hyperlipidemia, unspecified: Secondary | ICD-10-CM | POA: Diagnosis present

## 2018-05-26 HISTORY — PX: LAPAROSCOPIC NEPHRECTOMY: SHX1930

## 2018-05-26 LAB — HEMOGLOBIN AND HEMATOCRIT, BLOOD
HCT: 49.2 % (ref 39.0–52.0)
HEMOGLOBIN: 17.1 g/dL — AB (ref 13.0–17.0)

## 2018-05-26 LAB — TYPE AND SCREEN
ABO/RH(D): O POS
Antibody Screen: NEGATIVE

## 2018-05-26 SURGERY — NEPHRECTOMY, RADICAL, LAPAROSCOPIC, ADULT
Anesthesia: General | Laterality: Right

## 2018-05-26 MED ORDER — PROPOFOL 10 MG/ML IV BOLUS
INTRAVENOUS | Status: DC | PRN
  Administered 2018-05-26: 100 mg via INTRAVENOUS

## 2018-05-26 MED ORDER — METOPROLOL TARTRATE 50 MG PO TABS
50.0000 mg | ORAL_TABLET | Freq: Two times a day (BID) | ORAL | Status: DC
  Administered 2018-05-26 – 2018-05-27 (×2): 50 mg via ORAL
  Filled 2018-05-26 (×2): qty 1

## 2018-05-26 MED ORDER — PROMETHAZINE HCL 25 MG/ML IJ SOLN: 6.2500 mg | INTRAMUSCULAR | Status: DC | PRN

## 2018-05-26 MED ORDER — EPHEDRINE 5 MG/ML INJ
INTRAVENOUS | Status: AC
  Filled 2018-05-26: qty 10

## 2018-05-26 MED ORDER — CEFAZOLIN SODIUM-DEXTROSE 2-4 GM/100ML-% IV SOLN
2.0000 g | Freq: Once | INTRAVENOUS | Status: AC
  Administered 2018-05-26: 2 g via INTRAVENOUS
  Filled 2018-05-26: qty 100

## 2018-05-26 MED ORDER — PROPOFOL 10 MG/ML IV BOLUS
INTRAVENOUS | Status: AC
  Filled 2018-05-26: qty 40

## 2018-05-26 MED ORDER — SUGAMMADEX SODIUM 500 MG/5ML IV SOLN
INTRAVENOUS | Status: AC
  Filled 2018-05-26: qty 5

## 2018-05-26 MED ORDER — ACETAMINOPHEN 10 MG/ML IV SOLN
1000.0000 mg | Freq: Four times a day (QID) | INTRAVENOUS | Status: DC
  Administered 2018-05-26 – 2018-05-27 (×3): 1000 mg via INTRAVENOUS
  Filled 2018-05-26 (×4): qty 100

## 2018-05-26 MED ORDER — FENTANYL CITRATE (PF) 250 MCG/5ML IJ SOLN
INTRAMUSCULAR | Status: AC
  Filled 2018-05-26: qty 5

## 2018-05-26 MED ORDER — ASPIRIN EC 81 MG PO TBEC
81.0000 mg | DELAYED_RELEASE_TABLET | Freq: Every day | ORAL | Status: DC
  Administered 2018-05-26 – 2018-05-27 (×2): 81 mg via ORAL
  Filled 2018-05-26: qty 1

## 2018-05-26 MED ORDER — OXYCODONE-ACETAMINOPHEN 5-325 MG PO TABS: 1.0000 | ORAL_TABLET | ORAL | Status: DC | PRN

## 2018-05-26 MED ORDER — LACTATED RINGERS IR SOLN
Status: DC | PRN
  Administered 2018-05-26: 1000 mL

## 2018-05-26 MED ORDER — ONDANSETRON HCL 4 MG/2ML IJ SOLN
INTRAMUSCULAR | Status: DC | PRN
  Administered 2018-05-26: 4 mg via INTRAVENOUS

## 2018-05-26 MED ORDER — HYDROMORPHONE HCL 1 MG/ML IJ SOLN
INTRAMUSCULAR | Status: AC
  Administered 2018-05-26: 0.25 mg via INTRAVENOUS
  Filled 2018-05-26: qty 1

## 2018-05-26 MED ORDER — LACTATED RINGERS IV SOLN
INTRAVENOUS | Status: DC
  Administered 2018-05-26: 12:00:00 via INTRAVENOUS

## 2018-05-26 MED ORDER — GLYCOPYRROLATE PF 0.2 MG/ML IJ SOSY
PREFILLED_SYRINGE | INTRAMUSCULAR | Status: AC
  Filled 2018-05-26: qty 1

## 2018-05-26 MED ORDER — MIDAZOLAM HCL 5 MG/5ML IJ SOLN
INTRAMUSCULAR | Status: DC | PRN
  Administered 2018-05-26: 2 mg via INTRAVENOUS

## 2018-05-26 MED ORDER — MIDAZOLAM HCL 2 MG/2ML IJ SOLN
INTRAMUSCULAR | Status: AC
  Filled 2018-05-26: qty 2

## 2018-05-26 MED ORDER — 0.9 % SODIUM CHLORIDE (POUR BTL) OPTIME
TOPICAL | Status: DC | PRN
  Administered 2018-05-26: 1000 mL

## 2018-05-26 MED ORDER — ACETAMINOPHEN 10 MG/ML IV SOLN
INTRAVENOUS | Status: DC | PRN
  Administered 2018-05-26: 1000 mg via INTRAVENOUS

## 2018-05-26 MED ORDER — PHENYLEPHRINE 40 MCG/ML (10ML) SYRINGE FOR IV PUSH (FOR BLOOD PRESSURE SUPPORT)
PREFILLED_SYRINGE | INTRAVENOUS | Status: AC
  Filled 2018-05-26: qty 10

## 2018-05-26 MED ORDER — AMLODIPINE BESYLATE 10 MG PO TABS
10.0000 mg | ORAL_TABLET | Freq: Two times a day (BID) | ORAL | Status: DC
  Administered 2018-05-26 – 2018-05-27 (×2): 10 mg via ORAL
  Filled 2018-05-26 (×2): qty 1

## 2018-05-26 MED ORDER — DEXAMETHASONE SODIUM PHOSPHATE 10 MG/ML IJ SOLN
INTRAMUSCULAR | Status: DC | PRN
  Administered 2018-05-26: 10 mg via INTRAVENOUS

## 2018-05-26 MED ORDER — SUGAMMADEX SODIUM 200 MG/2ML IV SOLN
INTRAVENOUS | Status: AC
  Filled 2018-05-26: qty 2

## 2018-05-26 MED ORDER — ONDANSETRON HCL 4 MG/2ML IJ SOLN
INTRAMUSCULAR | Status: AC
  Filled 2018-05-26: qty 2

## 2018-05-26 MED ORDER — SODIUM CHLORIDE 0.9 % IJ SOLN
INTRAMUSCULAR | Status: AC
  Filled 2018-05-26: qty 20

## 2018-05-26 MED ORDER — BUPIVACAINE-EPINEPHRINE (PF) 0.5% -1:200000 IJ SOLN
INTRAMUSCULAR | Status: AC
  Filled 2018-05-26: qty 30

## 2018-05-26 MED ORDER — FENTANYL CITRATE (PF) 100 MCG/2ML IJ SOLN
INTRAMUSCULAR | Status: DC | PRN
  Administered 2018-05-26 (×3): 50 ug via INTRAVENOUS
  Administered 2018-05-26: 100 ug via INTRAVENOUS

## 2018-05-26 MED ORDER — SODIUM CHLORIDE 0.9 % IV SOLN
INTRAVENOUS | Status: DC
  Administered 2018-05-26 – 2018-05-27 (×2): via INTRAVENOUS

## 2018-05-26 MED ORDER — LACTATED RINGERS IV SOLN
INTRAVENOUS | Status: DC | PRN
Start: 2018-05-26 — End: 2018-05-26
  Administered 2018-05-26: 13:00:00 via INTRAVENOUS

## 2018-05-26 MED ORDER — SODIUM CHLORIDE 0.9 % IJ SOLN
INTRAMUSCULAR | Status: DC | PRN
  Administered 2018-05-26: 20 mL

## 2018-05-26 MED ORDER — PHENYLEPHRINE 40 MCG/ML (10ML) SYRINGE FOR IV PUSH (FOR BLOOD PRESSURE SUPPORT)
PREFILLED_SYRINGE | INTRAVENOUS | Status: DC | PRN
  Administered 2018-05-26: 80 ug via INTRAVENOUS

## 2018-05-26 MED ORDER — DEXAMETHASONE SODIUM PHOSPHATE 10 MG/ML IJ SOLN
INTRAMUSCULAR | Status: AC
  Filled 2018-05-26: qty 1

## 2018-05-26 MED ORDER — LIP MEDEX EX OINT
TOPICAL_OINTMENT | CUTANEOUS | Status: AC
  Filled 2018-05-26: qty 7

## 2018-05-26 MED ORDER — LIDOCAINE 2% (20 MG/ML) 5 ML SYRINGE
INTRAMUSCULAR | Status: AC
  Filled 2018-05-26: qty 5

## 2018-05-26 MED ORDER — SUGAMMADEX SODIUM 500 MG/5ML IV SOLN
INTRAVENOUS | Status: DC | PRN
  Administered 2018-05-26: 240 mg via INTRAVENOUS

## 2018-05-26 MED ORDER — ROCURONIUM BROMIDE 100 MG/10ML IV SOLN
INTRAVENOUS | Status: DC | PRN
  Administered 2018-05-26: 70 mg via INTRAVENOUS
  Administered 2018-05-26: 20 mg via INTRAVENOUS

## 2018-05-26 MED ORDER — EPHEDRINE SULFATE-NACL 50-0.9 MG/10ML-% IV SOSY
PREFILLED_SYRINGE | INTRAVENOUS | Status: DC | PRN
  Administered 2018-05-26 (×5): 10 mg via INTRAVENOUS

## 2018-05-26 MED ORDER — HYDROMORPHONE HCL 1 MG/ML IJ SOLN
0.2500 mg | INTRAMUSCULAR | Status: DC | PRN
  Administered 2018-05-26 (×2): 0.25 mg via INTRAVENOUS

## 2018-05-26 MED ORDER — BUPIVACAINE LIPOSOME 1.3 % IJ SUSP
20.0000 mL | Freq: Once | INTRAMUSCULAR | Status: AC
  Administered 2018-05-26: 20 mL
  Filled 2018-05-26: qty 20

## 2018-05-26 MED ORDER — LIDOCAINE HCL (CARDIAC) PF 100 MG/5ML IV SOSY
PREFILLED_SYRINGE | INTRAVENOUS | Status: DC | PRN
  Administered 2018-05-26: 100 mg via INTRAVENOUS

## 2018-05-26 MED ORDER — ONDANSETRON HCL 4 MG/2ML IJ SOLN: 4.0000 mg | INTRAMUSCULAR | Status: DC | PRN

## 2018-05-26 MED ORDER — PHENYLEPHRINE HCL 10 MG/ML IJ SOLN
INTRAMUSCULAR | Status: DC | PRN
  Administered 2018-05-26: 20 ug/min via INTRAVENOUS

## 2018-05-26 MED ORDER — PRAVASTATIN SODIUM 40 MG PO TABS
40.0000 mg | ORAL_TABLET | Freq: Every day | ORAL | Status: DC
  Administered 2018-05-26: 40 mg via ORAL
  Filled 2018-05-26: qty 1

## 2018-05-26 SURGICAL SUPPLY — 46 items
BAG LAPAROSCOPIC 12 15 PORT 16 (BASKET) ×1 IMPLANT
BAG RETRIEVAL 12/15 (BASKET) ×2
BAG RETRIEVAL 12/15MM (BASKET) ×1
BAG ZIPLOCK 12X15 (MISCELLANEOUS) ×3 IMPLANT
BLADE EXTENDED COATED 6.5IN (ELECTRODE) IMPLANT
BLADE SURG SZ10 CARB STEEL (BLADE) IMPLANT
CHLORAPREP W/TINT 26ML (MISCELLANEOUS) ×3 IMPLANT
CLIP VESOLOCK LG 6/CT PURPLE (CLIP) ×3 IMPLANT
CLIP VESOLOCK MED LG 6/CT (CLIP) ×3 IMPLANT
CLIP VESOLOCK XL 6/CT (CLIP) ×3 IMPLANT
COVER SURGICAL LIGHT HANDLE (MISCELLANEOUS) ×3 IMPLANT
CUTTER FLEX LINEAR 45M (STAPLE) ×3 IMPLANT
DERMABOND ADVANCED (GAUZE/BANDAGES/DRESSINGS) ×2
DERMABOND ADVANCED .7 DNX12 (GAUZE/BANDAGES/DRESSINGS) ×1 IMPLANT
DRAPE INCISE IOBAN 66X45 STRL (DRAPES) ×3 IMPLANT
DRAPE LAPAROSCOPIC ABDOMINAL (DRAPES) IMPLANT
DRAPE WARM FLUID 44X44 (DRAPE) IMPLANT
ELECT PENCIL ROCKER SW 15FT (MISCELLANEOUS) ×6 IMPLANT
ELECT REM PT RETURN 15FT ADLT (MISCELLANEOUS) ×3 IMPLANT
GLOVE BIO SURGEON STRL SZ 6.5 (GLOVE) ×2 IMPLANT
GLOVE BIO SURGEONS STRL SZ 6.5 (GLOVE) ×1
GLOVE BIOGEL M STRL SZ7.5 (GLOVE) ×3 IMPLANT
GOWN STRL REUS W/TWL LRG LVL3 (GOWN DISPOSABLE) ×6 IMPLANT
HEMOSTAT SURGICEL 4X8 (HEMOSTASIS) IMPLANT
IRRIG SUCT STRYKERFLOW 2 WTIP (MISCELLANEOUS) ×3
IRRIGATION SUCT STRKRFLW 2 WTP (MISCELLANEOUS) ×1 IMPLANT
KIT BASIN OR (CUSTOM PROCEDURE TRAY) ×3 IMPLANT
RELOAD 45 VASCULAR/THIN (ENDOMECHANICALS) ×3 IMPLANT
SCISSORS ENDO CVD 5DCS (MISCELLANEOUS) ×2 IMPLANT
SCISSORS LAP 5X35 DISP (ENDOMECHANICALS) ×3 IMPLANT
SHEARS HARMONIC ACE PLUS 36CM (ENDOMECHANICALS) ×3 IMPLANT
SLEEVE XCEL OPT CAN 5 100 (ENDOMECHANICALS) ×3 IMPLANT
SURGIFLO W/THROMBIN 8M KIT (HEMOSTASIS) IMPLANT
SUT MNCRL AB 4-0 PS2 18 (SUTURE) ×6 IMPLANT
SUT PDS AB 1 CTX 36 (SUTURE) ×6 IMPLANT
SUT VIC AB 2-0 SH 27 (SUTURE)
SUT VIC AB 2-0 SH 27X BRD (SUTURE) IMPLANT
SUT VICRYL 0 UR6 27IN ABS (SUTURE) ×3 IMPLANT
TOWEL OR 17X26 10 PK STRL BLUE (TOWEL DISPOSABLE) ×3 IMPLANT
TRAY FOLEY MTR SLVR 16FR STAT (SET/KITS/TRAYS/PACK) ×3 IMPLANT
TRAY LAPAROSCOPIC (CUSTOM PROCEDURE TRAY) ×3 IMPLANT
TROCAR BLADELESS OPT 5 100 (ENDOMECHANICALS) ×3 IMPLANT
TROCAR XCEL 12X100 BLDLESS (ENDOMECHANICALS) ×3 IMPLANT
TROCAR XCEL BLUNT TIP 100MML (ENDOMECHANICALS) ×3 IMPLANT
TUBING INSUF HEATED (TUBING) IMPLANT
YANKAUER SUCT BULB TIP 10FT TU (MISCELLANEOUS) IMPLANT

## 2018-05-26 NOTE — Interval H&P Note (Signed)
History and Physical Interval Note:  05/26/2018 12:16 PM  Jeffrey Stout  has presented today for surgery, with the diagnosis of RIGHT RENAL NEOPLASM  The various methods of treatment have been discussed with the patient and family. After consideration of risks, benefits and other options for treatment, the patient has consented to  Procedure(s): [name] (Right) as a surgical intervention .  The patient's history has been reviewed, patient examined, no change in status, stable for surgery.  I have reviewed the patient's chart and labs.  Questions were answered to the patient's satisfaction.     [name],LES

## 2018-05-26 NOTE — Anesthesia Procedure Notes (Signed)
Procedure Name: Intubation Date/Time: 05/26/2018 1:08 PM Performed by: West Pugh, CRNA Pre-anesthesia Checklist: Patient identified, Emergency Drugs available, Suction available, Patient being monitored and Timeout performed Patient Re-evaluated:Patient Re-evaluated prior to induction Oxygen Delivery Method: Circle system utilized Preoxygenation: Pre-oxygenation with 100% oxygen Induction Type: IV induction Ventilation: Mask ventilation without difficulty Laryngoscope Size: Mac and 4 Grade View: Grade I Tube type: Subglottic suction tube Tube size: 7.5 mm Number of attempts: 1 Airway Equipment and Method: Stylet Placement Confirmation: ETT inserted through vocal cords under direct vision,  positive ETCO2,  CO2 detector and breath sounds checked- equal and bilateral Secured at: 23 cm Tube secured with: Tape Dental Injury: Teeth and Oropharynx as per pre-operative assessment

## 2018-05-26 NOTE — Anesthesia Postprocedure Evaluation (Signed)
Anesthesia Post Note  Patient: Jeffrey Stout  Procedure(s) Performed: LAPAROSCOPIC RADICAL NEPHRECTOMY (Right )     Patient location during evaluation: PACU Anesthesia Type: General Level of consciousness: awake and alert Pain management: pain level controlled Vital Signs Assessment: post-procedure vital signs reviewed and stable Respiratory status: spontaneous breathing, nonlabored ventilation, respiratory function stable and patient connected to nasal cannula oxygen Cardiovascular status: blood pressure returned to baseline and stable Postop Assessment: no apparent nausea or vomiting Anesthetic complications: no    Last Vitals:  Vitals:   05/26/18 1630 05/26/18 1645  BP: 117/64 115/62  Pulse: (!) 53 (!) 57  Resp: 17 14  Temp: (!) 36.3 C (!) 36.3 C  SpO2: 99% 98%    Last Pain:  Vitals:   05/26/18 1630  TempSrc:   PainSc: 4                  Ardene Remley S

## 2018-05-26 NOTE — Transfer of Care (Signed)
Immediate Anesthesia Transfer of Care Note  Patient: Jeffrey Stout  Procedure(s) Performed: LAPAROSCOPIC RADICAL NEPHRECTOMY (Right )  Patient Location: PACU  Anesthesia Type:General  Level of Consciousness: awake, drowsy and patient cooperative  Airway & Oxygen Therapy: Patient Spontanous Breathing and Patient connected to face mask oxygen  Post-op Assessment: Report given to RN and Post -op Vital signs reviewed and stable  Post vital signs: Reviewed and stable  Last Vitals:  Vitals Value Taken Time  BP 126/69 05/26/2018  4:00 PM  Temp    Pulse 60 05/26/2018  4:02 PM  Resp 19 05/26/2018  4:02 PM  SpO2 100 % 05/26/2018  4:02 PM  Vitals shown include unvalidated device data.  Last Pain:  Vitals:   05/26/18 1206  TempSrc:   PainSc: 0-No pain         Complications: No apparent anesthesia complications

## 2018-05-26 NOTE — Op Note (Addendum)
Preoperative diagnosis: Right renal neoplasm  Postoperative diagnosis: Right renal neoplasm  Procedures: 1.  Laparoscopic adhesio lysis 2.  Right laparoscopic radical nephrectomy  Surgeon: Pryor Curia MD  Resident: Basilio Cairo, MD  Anesthesia: General  Complications: None  EBL: 50 cc  Specimen: Right kidney  Disposition of specimen: Pathology  Indication: This is a 67 year old gentleman who was recently found to have an incidentally detected enhancing right renal mass in an atrophic right kidney with renal atrophy thought to be related to his vascular disease.  After counseling and undergoing an extensive cardiac evaluation considering his severe peripheral vascular disease, he was felt to be an acceptable candidate to proceed with surgical treatment and a radical nephrectomy.  We discussed the potential risks, complications, and the expected recovery process associated with this procedure and he gave informed consent to proceed.  Description of procedure: The patient was taken to the operating room and a general anesthetic was administered.  He was given preoperative antibiotics, placed in the right modified flank position, and prepped and draped in the usual sterile fashion.  Next, a preoperative timeout was performed.  A site was then selected just superior to the umbilicus for placement of the camera port.  This was placed using a standard open Hassan technique.  0 Vicryl fascial holding sutures were placed to secure the [facility] port.  A 30 degree lens was then used to inspect the abdomen.  There were noted to be extensive right upper quadrant adhesions with both the omentum and the a sending and transverse colon adhesed to the abdominal wall.  At this point, a 12 mm port was placed in the right lower quadrant and an additional 5 mm port was placed in the lower midline.  Using sharp dissection, the previously mentioned adhesions were carefully taken down allowing the a  sending and transverse colon to be retracted medially and inferiorly away from the kidney. The adhesiolysis took approximately 35 minutes and added a significant amount of time to the operation accounting for about 25% of the total operative time.  Once all adhesions were taken down, attention then turned to the radical nephrectomy.  The space between the anterior layer of Gerota's fascia and the a sending colon was carefully developed using the harmonic scalpel and careful blunt dissection.  The ureter was identified and was able to be lifted off the psoas muscle and dissection continued posteriorly along the psoas muscle allowing the kidney to be lifted anteriorly.  The renal hilum was then encountered and there appeared to be a single renal vein with a branching renal artery.  The renal artery was able to be isolated from the renal vein and a 10 mm hemo-lock clip was placed on the proximal end of the renal artery.  The remaining renal hilum was then taken en bloc with a 45 mm flex ETS stapler.  The harmonic scalpel was then used to divide the fatty tissue between the right adrenal gland and the upper pole of the right kidney.  There were extensive adhesions between the liver and the peritoneum that were also carefully divided with the harmonic scalpel.  The ureter was then divided between multiple Hem o lok clips in the lateral dissection was performed with the harmonic scalpel.  This allowed the kidney to be completely freed from the surrounding structures.  The renal fossa was then examined.  Hemostasis appeared excellent.  There appeared to be no bleeding from the renal artery stump or renal vein stump.  A  0 Vicryl suture was then placed with a suture passer device through the right lower quadrant 12 mm port site for closure.  The right kidney was then placed into a 15 mm retrieval bag and the remaining ports were removed under direct vision.  The camera port site was then extended slightly inferiorly in a  periumbilical fashion.  The specimen was removed intact.  This fascial opening was then closed with 2 running 0 PDS sutures.  Exparel was used for local anesthesia.  The skin was reapproximated with 4-0 Monocryl subcuticular closures and Dermabond was applied to the skin.  The patient appeared to tolerate the procedure well without complications.  He was able to be extubated and transferred to the recovery unit in satisfactory condition.

## 2018-05-26 NOTE — Progress Notes (Signed)
Patient ID: Jeffrey Stout, male   DOB: [birth year], 67 y.o.   MRN: [id number]  . Post-op note  Subjective: The patient is doing well.  No complaints.  Objective: Vital signs in last 24 hours: Temp:  [96.9 F (36.1 C)-98.1 F (36.7 C)] 97.7 F (36.5 C) (10/03 2025) Pulse Rate:  [53-66] 66 (10/03 2025) Resp:  [14-21] 18 (10/03 2025) BP: (113-144)/(61-82) 125/70 (10/03 2025) SpO2:  [96 %-100 %] 97 % (10/03 2025) Weight:  [80.8 kg] 80.8 kg (10/03 1206)  Intake/Output from previous day: No intake/output data recorded. Intake/Output this shift: No intake/output data recorded.  Physical Exam:  General: Alert and oriented. Abdomen: Soft, Nondistended. Incisions: Clean and dry.  Lab Results: Recent Labs    05/26/18 1609  HGB 17.1*  HCT 49.2    Assessment/Plan: POD#0   1) Continue to monitor   Pryor Curia. MD   LOS: 0 days   Phyliss Hulick,LES 05/26/2018, 8:29 PM

## 2018-05-26 NOTE — Anesthesia Preprocedure Evaluation (Signed)
Anesthesia Evaluation  Patient identified by MRN, date of birth, ID band Patient awake    Reviewed: Allergy & Precautions, NPO status , Patient's Chart, lab work & pertinent test results  Airway Mallampati: II  TM Distance: >3 FB Neck ROM: Full    Dental no notable dental hx.    Pulmonary Current Smoker,    Pulmonary exam normal breath sounds clear to auscultation       Cardiovascular hypertension, + Peripheral Vascular Disease  Normal cardiovascular exam Rhythm:Regular Rate:Normal  3cm AAA     Neuro/Psych TIAnegative psych ROS   GI/Hepatic negative GI ROS, Neg liver ROS,   Endo/Other  negative endocrine ROS  Renal/GU Renal InsufficiencyRenal disease  negative genitourinary   Musculoskeletal negative musculoskeletal ROS (+)   Abdominal   Peds negative pediatric ROS (+)  Hematology negative hematology ROS (+)   Anesthesia Other Findings   Reproductive/Obstetrics negative OB ROS                             Anesthesia Physical Anesthesia Plan  ASA: IV  Anesthesia Plan: General   Post-op Pain Management:    Induction: Intravenous  PONV Risk Score and Plan: 2 and Ondansetron, Dexamethasone and Treatment may vary due to age or medical condition  Airway Management Planned: Oral ETT  Additional Equipment:   Intra-op Plan:   Post-operative Plan: Extubation in OR  Informed Consent: I have reviewed the patients History and Physical, chart, labs and discussed the procedure including the risks, benefits and alternatives for the proposed anesthesia with the patient or authorized representative who has indicated his/her understanding and acceptance.   Dental advisory given  Plan Discussed with: CRNA and Surgeon  Anesthesia Plan Comments:         Anesthesia Quick Evaluation

## 2018-05-27 ENCOUNTER — Encounter (HOSPITAL_COMMUNITY): Payer: Self-pay | Admitting: Urology

## 2018-05-27 LAB — CBC
HCT: 46.1 % (ref 39.0–52.0)
Hemoglobin: 15.7 g/dL (ref 13.0–17.0)
MCH: 31.8 pg (ref 26.0–34.0)
MCHC: 34.1 g/dL (ref 30.0–36.0)
MCV: 93.5 fL (ref 78.0–100.0)
PLATELETS: 135 10*3/uL — AB (ref 150–400)
RBC: 4.93 MIL/uL (ref 4.22–5.81)
RDW: 12.7 % (ref 11.5–15.5)
WBC: 7.3 10*3/uL (ref 4.0–10.5)

## 2018-05-27 LAB — BASIC METABOLIC PANEL
ANION GAP: 11 (ref 5–15)
BUN: 14 mg/dL (ref 8–23)
CO2: 23 mmol/L (ref 22–32)
Calcium: 8.9 mg/dL (ref 8.9–10.3)
Chloride: 104 mmol/L (ref 98–111)
Creatinine, Ser: 1.38 mg/dL — ABNORMAL HIGH (ref 0.61–1.24)
GFR, EST AFRICAN AMERICAN: 59 mL/min — AB (ref >60)
GFR, EST NON AFRICAN AMERICAN: 51 mL/min — AB (ref >60)
Glucose, Bld: 172 mg/dL — ABNORMAL HIGH (ref 70–99)
POTASSIUM: 4.3 mmol/L (ref 3.5–5.1)
SODIUM: 138 mmol/L (ref 135–145)

## 2018-05-27 MED ORDER — TRAMADOL HCL 50 MG PO TABS: 50.0000 mg | ORAL_TABLET | Freq: Four times a day (QID) | ORAL | Status: DC | PRN

## 2018-05-27 MED ORDER — TRAMADOL HCL 50 MG PO TABS: 50.0000 mg | ORAL_TABLET | Freq: Four times a day (QID) | ORAL | 0 refills | Status: DC | PRN

## 2018-05-27 MED ORDER — DOCUSATE SODIUM 100 MG PO CAPS
100.0000 mg | ORAL_CAPSULE | Freq: Two times a day (BID) | ORAL | Status: DC
  Administered 2018-05-27: 100 mg via ORAL
  Filled 2018-05-27: qty 1

## 2018-05-27 MED ORDER — DOCUSATE SODIUM 100 MG PO CAPS: 100.0000 mg | ORAL_CAPSULE | Freq: Two times a day (BID) | ORAL | 0 refills | Status: DC

## 2018-05-27 MED ORDER — ACETAMINOPHEN 325 MG PO TABS: 650.0000 mg | ORAL_TABLET | Freq: Four times a day (QID) | ORAL | Status: DC | PRN

## 2018-05-27 MED ORDER — BISACODYL 10 MG RE SUPP
10.0000 mg | Freq: Once | RECTAL | Status: AC
  Administered 2018-05-27: 10 mg via RECTAL
  Filled 2018-05-27: qty 1

## 2018-05-27 NOTE — Discharge Instructions (Addendum)
1.  Activity:  You are encouraged to ambulate frequently (about every hour during waking hours) to help prevent blood clots from forming in your legs or lungs.  However, you should not engage in any heavy lifting (> 10-15 lbs), strenuous activity, or straining. 2. Diet: You should advance your diet as instructed by your physician.  It will be normal to have some bloating, nausea, and abdominal discomfort intermittently. 3. Prescriptions:  You will be provided a prescription for pain medication to take as needed.  If your pain is not severe enough to require the prescription pain medication, you may take extra strength Tylenol instead which will have less side effects.  You should also take a prescribed stool softener to avoid straining with bowel movements as the prescription pain medication may constipate you. 4. Incisions: You may remove your dressing bandages 48 hours after surgery if not removed in the hospital.  You will either have some small staples or special tissue glue at each of the incision sites. Once the bandages are removed (if present), the incisions may stay open to air.  You may start showering (but not soaking or bathing in water) the 2nd day after surgery and the incisions simply need to be patted dry after the shower.  No additional care is needed. 5. What to call us about: You should call the office [phone]) if you develop fever > 101 or develop persistent vomiting.   YOU SHOULD CONTINUE ASPIRIN 81 MG AND YOU MAY RESTART PLAVIX 1 WEEK AFTER SURGERY

## 2018-05-27 NOTE — Progress Notes (Signed)
Patient ID: Jeffrey Stout, male   DOB: 10-12-1950, 67 y.o.   MRN: [id number]  1 Day Post-Op Subjective: Pt doing well.  Pain well controlled.  Objective: Vital signs in last 24 hours: Temp:  [96.9 F (36.1 C)-98.1 F (36.7 C)] 97.7 F (36.5 C) (10/04 0411) Pulse Rate:  [53-66] 63 (10/04 0411) Resp:  [14-21] 18 (10/04 0411) BP: (113-144)/(61-82) 131/70 (10/04 0411) SpO2:  [95 %-100 %] 95 % (10/04 0411) Weight:  [80.8 kg] 80.8 kg (10/03 1206)  Intake/Output from previous day: 10/03 0701 - 10/04 0700 In: 5561.2 [P.O.:120; I.V.:2860.3; IV Piggyback:2580.8] Out: 1600 [Urine:1450; Blood:150] Intake/Output this shift: No intake/output data recorded.  Physical Exam:  General: Alert and oriented CV: RRR Lungs: Clear Abdomen: Soft, ND Incisions: C/D/I Ext: NT, No erythema  Lab Results: Recent Labs    05/26/18 1609 05/27/18 0523  HGB 17.1* 15.7  HCT 49.2 46.1   BMET Recent Labs    05/27/18 0523  NA 138  K 4.3  CL 104  CO2 23  GLUCOSE 172*  BUN 14  CREATININE 1.38*  CALCIUM 8.9     Studies/Results: No results found.  Assessment/Plan: POD # 1 s/p right nephrectomy - Ambulate, IS - Advance diet - Oral pain medication - SL IVF - D/C catheter - D/C either later today or tomorrow morning   LOS: 1 day   Litha Lamartina,LES 05/27/2018, 9:10 AM

## 2018-05-27 NOTE — Discharge Summary (Signed)
[facility] Discharge Summary  Admit date: 05/26/2018  Discharge date and time: 05/27/18   Discharge to: Home  Discharge Service: Urology  Discharge Attending Physician:  Dr. Alinda Money  Discharge  Diagnoses: <principal problem not specified>  Secondary Diagnosis: Active Problems:   Renal mass, right   OR Procedures: Procedure(s): LAPAROSCOPIC RADICAL NEPHRECTOMY 05/26/2018   Ancillary Procedures: None   Discharge Day Services: The patient was seen and examined by the Urology team both in the morning and immediately prior to discharge.  Vital signs and laboratory values were stable and within normal limits.  The physical exam was benign and unchanged and all surgical wounds were examined.  Discharge instructions were explained and all questions answered.  Subjective  No acute events overnight. Pain Controlled. No fever or chills.  Objective No data found. No intake/output data recorded.  General Appearance:        No acute distress Lungs:                       Normal work of breathing on room air Heart:                                Regular rate and rhythm Abdomen:                         Soft, non-tender, non-distended. Port incisions and midline incisions clean dry and intact with surgical glue Extremities:                      Warm and well perfused   Hospital Course:  The patient underwent a right laparoscopic radical nephrectomy on 05/26/2018.  The patient tolerated the procedure well, was extubated in the OR, and afterwards was taken to the PACU for routine post-surgical care. When stable the patient was transferred to the floor.   The patient did well postoperatively.  The patient's diet was slowly advanced and at the time of discharge was tolerating a regular diet.  The patient was discharged home 1 Day Post-Op, at which point was tolerating a regular solid diet, was able to void spontaneously, have adequate pain control with P.O. pain medication, and could ambulate  without difficulty. The patient will follow up with [place] for post op check.   He will restart his Plavix in 1 week. He will continue his baby ASA.   Condition at Discharge: Improved  Discharge Medications:  Allergies as of 05/27/2018   No Known Allergies     Medication List    STOP taking these medications   clopidogrel 75 MG tablet Commonly known as:  PLAVIX     TAKE these medications   amLODipine 10 MG tablet Commonly known as:  NORVASC Take 10 mg by mouth 2 (two) times daily.   aspirin 81 MG tablet Take 81 mg by mouth daily. Reported on 11/01/2015   benazepril 40 MG tablet Commonly known as:  LOTENSIN Take 40 mg by mouth every evening.   docusate sodium 100 MG capsule Commonly known as:  COLACE Take 1 capsule (100 mg total) by mouth 2 (two) times daily.   metoprolol tartrate 50 MG tablet Commonly known as:  LOPRESSOR Take 50 mg by mouth 2 (two) times daily.   pravastatin 40 MG tablet Commonly known as:  PRAVACHOL Take 40 mg by mouth at bedtime.   traMADol 50 MG tablet Commonly known as:  ULTRAM Take 1-2 tablets (50-100 mg total) by mouth every 6 (six) hours as needed (pain).

## 2018-06-21 DIAGNOSIS — C641 Malignant neoplasm of right kidney, except renal pelvis: Secondary | ICD-10-CM | POA: Diagnosis not present

## 2018-08-01 DIAGNOSIS — N183 Chronic kidney disease, stage 3 (moderate): Secondary | ICD-10-CM | POA: Diagnosis not present

## 2018-08-01 DIAGNOSIS — Z8679 Personal history of other diseases of the circulatory system: Secondary | ICD-10-CM | POA: Diagnosis not present

## 2018-08-01 DIAGNOSIS — I129 Hypertensive chronic kidney disease with stage 1 through stage 4 chronic kidney disease, or unspecified chronic kidney disease: Secondary | ICD-10-CM | POA: Diagnosis not present

## 2018-08-01 DIAGNOSIS — Z23 Encounter for immunization: Secondary | ICD-10-CM | POA: Diagnosis not present

## 2018-08-01 DIAGNOSIS — Z720 Tobacco use: Secondary | ICD-10-CM | POA: Diagnosis not present

## 2018-08-01 DIAGNOSIS — Z905 Acquired absence of kidney: Secondary | ICD-10-CM | POA: Diagnosis not present

## 2018-09-28 ENCOUNTER — Other Ambulatory Visit: Payer: Self-pay

## 2018-09-28 DIAGNOSIS — I739 Peripheral vascular disease, unspecified: Secondary | ICD-10-CM

## 2018-09-28 DIAGNOSIS — I745 Embolism and thrombosis of iliac artery: Secondary | ICD-10-CM

## 2018-10-04 ENCOUNTER — Ambulatory Visit (HOSPITAL_COMMUNITY)
Admission: RE | Admit: 2018-10-04 | Discharge: 2018-10-04 | Disposition: A | Discharge status: Home / Self Care | Payer: Medicare Other | Source: Ambulatory Visit | Attending: Vascular Surgery | Admitting: Vascular Surgery

## 2018-10-04 ENCOUNTER — Other Ambulatory Visit: Payer: Self-pay

## 2018-10-04 ENCOUNTER — Ambulatory Visit (INDEPENDENT_AMBULATORY_CARE_PROVIDER_SITE_OTHER)
Admission: RE | Admit: 2018-10-04 | Discharge: 2018-10-04 | Disposition: A | Discharge status: Home / Self Care | Payer: Medicare Other | Source: Ambulatory Visit | Attending: Vascular Surgery | Admitting: Vascular Surgery

## 2018-10-04 ENCOUNTER — Encounter: Payer: Self-pay | Admitting: Vascular Surgery

## 2018-10-04 ENCOUNTER — Ambulatory Visit (INDEPENDENT_AMBULATORY_CARE_PROVIDER_SITE_OTHER): Payer: Medicare Other | Admitting: Vascular Surgery

## 2018-10-04 VITALS — BP 143/78 | HR 55 | Temp 97.0°F | Resp 20 | Ht 75.0 in | Wt 178.0 lb

## 2018-10-04 DIAGNOSIS — I739 Peripheral vascular disease, unspecified: Secondary | ICD-10-CM | POA: Insufficient documentation

## 2018-10-04 DIAGNOSIS — I745 Embolism and thrombosis of iliac artery: Secondary | ICD-10-CM | POA: Diagnosis not present

## 2018-10-04 NOTE — Progress Notes (Signed)
Patient name: Jeffrey Stout MRN: [id number] DOB: May 22, 1951 Sex: male  REASON FOR VISIT: 42-month follow-up with surveillance  HPI: Jeffrey Stout is a 68 y.o. male with multiple medical problems that presents for follow-up at 47-month interval after previous left common and external iliac stenting and a left to right femoral-femoral bypass for right lower extremity claudication by Dr. Bridgett Larsson.  In review of the notes patient had left common iliac stenting by Dr. Bridgett Larsson on 02/15/2014.  Ultimately at a later date he underwent a left to right femoral-femoral bypass 04/11/2014.  Then he had a drug-coated balloon angioplasty of the left common iliac stent and extension of the stent into the external iliac artery on 11/12/2016.  All this was performed Dr. Bridgett Larsson.  On follow-up today he feels that he is doing pretty well overall.  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  He has been fairly sedentary this winter and the little bit of walking he has been doing he denies any symptoms in his right leg.  He did have ABIs and a aortoiliac duplex prior to his appointment today.  He continues to smoke heavily.  Past Medical History:  Diagnosis Date  . AAA (abdominal aortic aneurysm) ([place]) 02/03/2018   3cm fusiform, noted on CT abd   . Adrenal myelolipoma 03/07/2018   Small, right 1.9x1.5cm, noted on MRI ABD  . Aortic atherosclerosis ([place]) 03/07/2018   Severe, noted on MRI ABD  . Cyst of left kidney 03/07/2018   Multiple, noted on MRI ABD  . Diverticulosis 02/03/2018   descending ang sigmoid, noted on CT abd   . GERD (gastroesophageal reflux disease)   . Grade I diastolic dysfunction 23/30/0762   Noted on ECHO   . H/O renal artery stenosis   . Hyperlipidemia   . Hypertension   . Intermittent claudication ([place])   . LVH (left ventricular hypertrophy) 04/04/2014   Mild, noted on ECHO  . Peripheral  arterial disease ([place])   . Renal artery stenosis ([place])   . Right renal mass 03/07/2018   2.7 cm, noted on MRI ABD  . Thrombus of aorta ([place]) 02/03/2018   progressive mural, noted on CT abd   . TIA (transient ischemic attack) 04/2013   went to [facility]    Past Surgical History:  Procedure Laterality Date  . ABDOMINAL AORTAGRAM N/A 02/15/2014   Procedure: ABDOMINAL Maxcine Ham;  Surgeon: Conrad Benedict, MD;  Location: [facility] CATH LAB;  Service: Cardiovascular;  Laterality: N/A;  . ABDOMINAL AORTOGRAM W/LOWER EXTREMITY N/A 11/12/2016   Procedure: Abdominal Aortogram w/Lower Extremity;  Surgeon: Conrad Haralson, MD;  Location: [facility] CV LAB;  Service: Cardiovascular;  Laterality: N/A;  . CHOLECYSTECTOMY    . COLONOSCOPY    . FEMORAL-FEMORAL BYPASS GRAFT Bilateral 04/11/2014   Procedure: BYPASS GRAFT LEFT-RIGHT FEMORAL-FEMORAL ARTERY and bilateral femoral endarterectomies with Bovine Patch Angioplasties.;  Surgeon: Conrad Fallon, MD;  Location: [facility];  Service: Vascular;  Laterality: Bilateral;  . LAPAROSCOPIC NEPHRECTOMY Right 05/26/2018   Procedure: LAPAROSCOPIC RADICAL NEPHRECTOMY;  Surgeon: Raynelle Bring, MD;  Location: [facility] ORS;  Service: Urology;  Laterality: Right;  . PERIPHERAL VASCULAR INTERVENTION  11/12/2016   Procedure: Peripheral Vascular Intervention;  Surgeon: Conrad Pr[place], MD;  Location: [facility] CV LAB;  Service: Cardiovascular;;  . RENAL ARTERY STENT Left 2015  . VASCULAR SURGERY      Family  History  Problem Relation Age of Onset  . Diabetes Mother   . Hypertension Brother   . Heart attack Brother     SOCIAL HISTORY: Social History   Tobacco Use  . Smoking status: Current Every Day Smoker    Packs/day: 1.50    Years: 53.00    Pack years: 79.50    Types: Cigarettes  . Smokeless tobacco: Never Used  Substance Use Topics  . Alcohol use: No    Alcohol/week: 0.0 standard drinks    No Known Allergies  Current Outpatient Medications  Medication Sig  Dispense Refill  . amLODipine (NORVASC) 10 MG tablet Take 10 mg by mouth 2 (two) times daily.     Marland Kitchen aspirin 81 MG tablet Take 81 mg by mouth daily. Reported on 11/01/2015    . benazepril (LOTENSIN) 40 MG tablet Take 40 mg by mouth every evening.     . clopidogrel (PLAVIX) 75 MG tablet Take 75 mg by mouth daily.    . metoprolol (LOPRESSOR) 50 MG tablet Take 50 mg by mouth 2 (two) times daily.   5  . pravastatin (PRAVACHOL) 40 MG tablet Take 40 mg by mouth at bedtime.      No current facility-administered medications for this visit.     REVIEW OF SYSTEMS:  [X]  denotes positive finding, [ ]  denotes negative finding Cardiac  Comments:  Chest pain or chest pressure:    Shortness of breath upon exertion:    Short of breath when lying flat:    Irregular heart rhythm:        Vascular    Pain in calf, thigh, or hip brought on by ambulation: x Right leg  Pain in feet at night that wakes you up from your sleep:     Blood clot in your veins:    Leg swelling:         Pulmonary    Oxygen at home:    Productive cough:     Wheezing:         Neurologic    Sudden weakness in arms or legs:     Sudden numbness in arms or legs:     Sudden onset of difficulty speaking or slurred speech:    Temporary loss of vision in one eye:     Problems with dizziness:         Gastrointestinal    Blood in stool:     Vomited blood:         Genitourinary    Burning when urinating:     Blood in urine:        Psychiatric    Major depression:         Hematologic    Bleeding problems:    Problems with blood clotting too easily:        Skin    Rashes or ulcers:        Constitutional    Fever or chills:      PHYSICAL EXAM: Vitals:   10/04/18 1003  BP: (!) 143/78  Pulse: (!) 55  Resp: 20  Temp: (!) 97 F (36.1 C)  SpO2: 99%  Weight: 178 lb (80.7 kg)  Height: 6\' 3"  (1.905 m)    GENERAL: The patient is a well-nourished male, in no acute distress. The vital signs are documented above. CARDIAC:  There is a regular rate and rhythm.  VASCULAR:  2+ palpable bilateral femoral pulses, pulse in fem fem graft 1+ palpable DP pulses bilaterally PULMONARY: There is good air  exchange bilaterally without wheezing or rales. ABDOMEN: Soft and non-tender with normal pitched bowel sounds.  MUSCULOSKELETAL: There are no major deformities or cyanosis. NEUROLOGIC: No focal weakness or paresthesias are detected. SKIN: There are no ulcers or rashes noted. PSYCHIATRIC: The patient has a normal affect.  DATA:   Reviewed his old CTA abdomen pelvis from last year and this shows a fairly diseased aorta with fair amount of mural thrombus and least a 50% stenosis.  ABIs today are 0.91 on the right triphasic waveform and 0.81 on the left with biphasic waveform.  His aortoiliac duplex shows stenosis in of the left iliac stents with a velocity of 362 and a second stenosis distal to the stent with a velocity of 421 and monophasic waveform.  Assessment/Plan:  On exam Mr. Demonte has palpable bilateral femoral pulses and his femorofemoral graft is open and he also has 1+ palpable dorsalis pedis pulses in the foot.  My concern is that he has fairly elevated velocities in the left iliac stent as well as distal to the stent in the 360-420 range.  I discussed with him that this is the donor artery to his femoral-femoral bypass and we should perform left iliac arteriogram and likely intervention given the elevated velocities.  Patient ultimately does not want to undergo another procedure at this time and states that his right leg has been doing okay (even though he does endorse some right leg claudication when walking long distances up hills).  I discussed with him my concern that if his left donor artery goes down he going thrombose his femoral femoral bypass.  He would rather come back in 3 months with repeat imaging for close interval surveillance.  I also reviewed his old CTA from Dr. Bridgett Larsson last year and he has a fair  amount of disease in his aorta and at some point may need an aortic base procedure pending his overall wellbeing if this fails in the future.  He does smoke heavily and I talked to him about ongoing tobacco abuse is going to place him at high risk for progression of disease.   Marty Heck, MD [facility] Office: [phone] Pager: [phone]

## 2018-10-18 IMAGING — MR MR ABDOMEN WO/W CM
9 of 18 series · 19 of 48 positions shown · IV contrast (multihance)
Comparison: No prior abdominal MRI. CT the abdomen and pelvis
02/03/2018.

CLINICAL DATA: 66-year-old male with history of right renal mass
noted on prior CT examination. Followup evaluation.

EXAM:
MRI ABDOMEN WITHOUT AND WITH CONTRAST
TECHNIQUE: Multiplanar multisequence MR imaging of the abdomen was performed
both before and after the administration of intravenous contrast.
CONTRAST:  17mL MULTIHANCE GADOBENATE DIMEGLUMINE 529 MG/ML IV SOLN

[Series 3: DWI b500 · axial · 6.0mm · 1.72mm/px · z∈[-130,+175]mm · 2 of 80 slices shown]
[im 1/80]
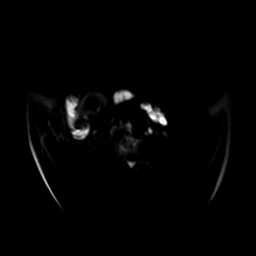
[im 80/80]
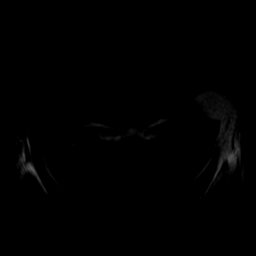

[Series 4: T2 fat-sat · axial · 5.0mm · 0.86mm/px · z∈[-122,+148]mm · 2 of 55 slices shown]
[im 1/55]
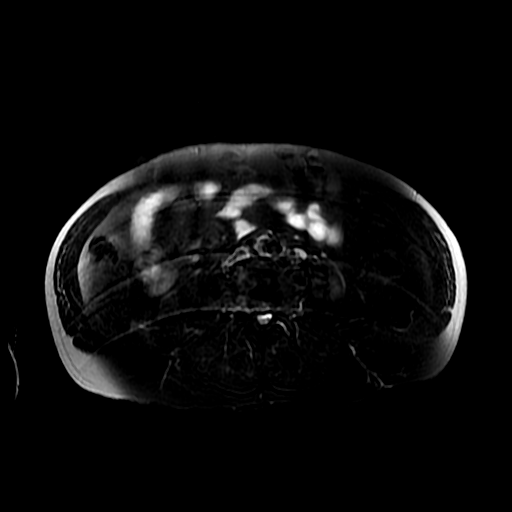
[im 55/55]
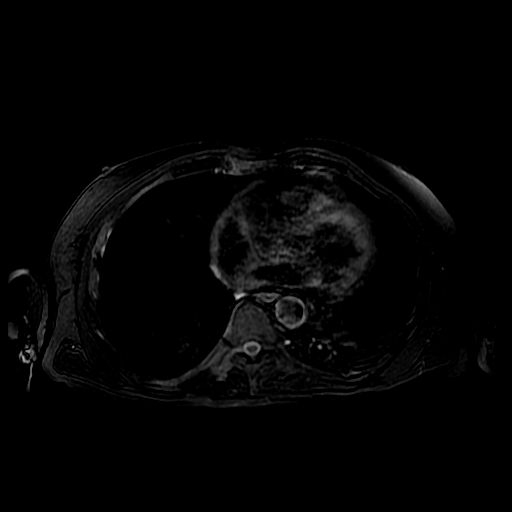

[Series 5: T2 · axial · 5.0mm · 0.86mm/px · z∈[-122,+148]mm · 2 of 55 slices shown (1 of 2)]
[im 1/55]
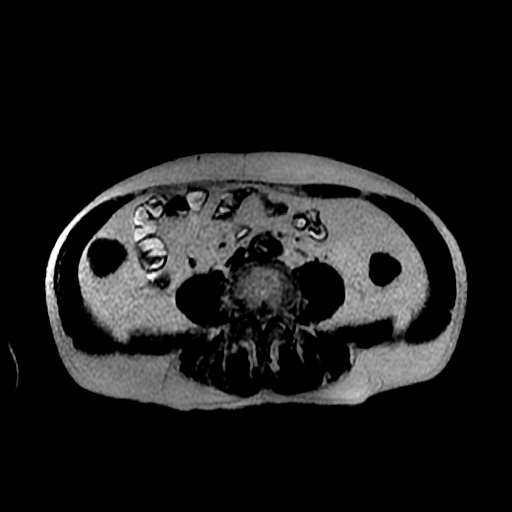
[im 55/55]
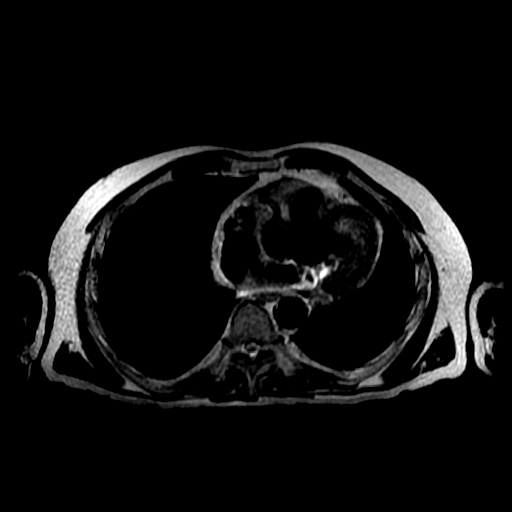

[Series 6: T2 · coronal · 5.0mm · 0.76mm/px · 2 of 50 slices shown (2 of 2)]
[im 1/50]
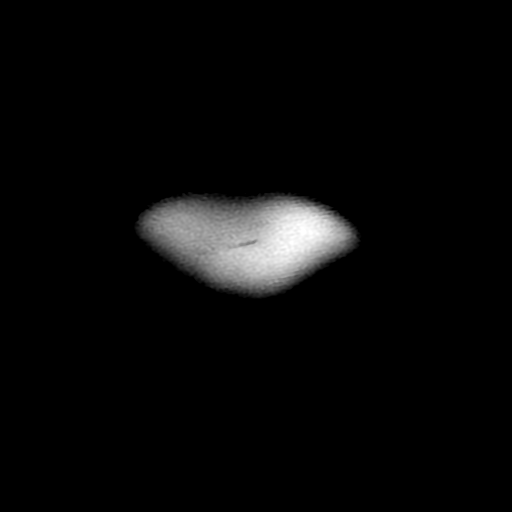
[im 50/50]
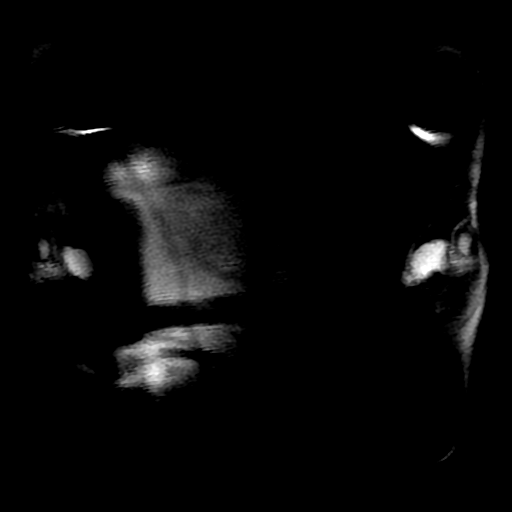

[Series 7: bSSFP · axial · 5.0mm · 0.86mm/px · z∈[-122,+148]mm · 2 of 55 slices shown]
[im 1/55]
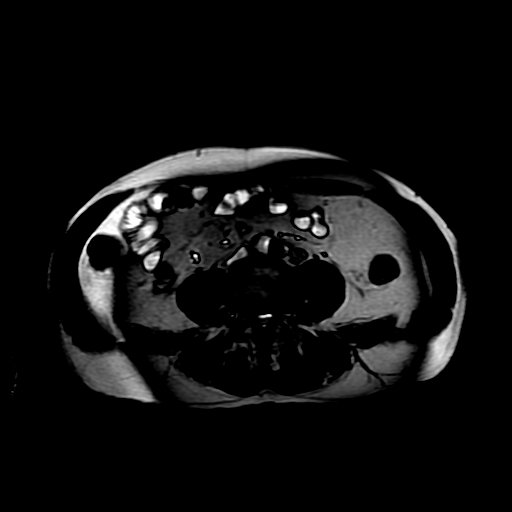
[im 55/55]
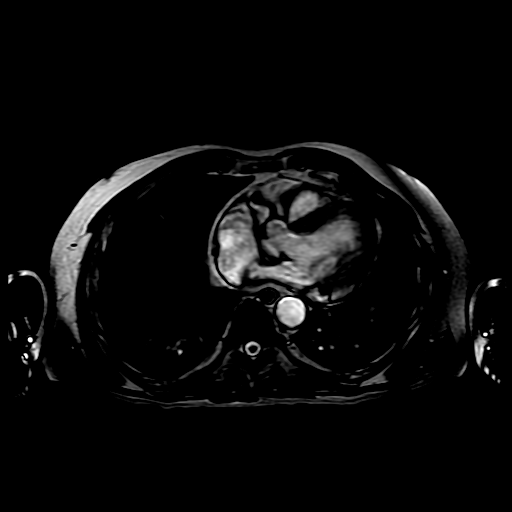

[Series 8: ax dualecho · axial · 5.0mm · 0.86mm/px · z∈[-122,+148]mm · 4 of 110 slices shown]
[im 1/110]
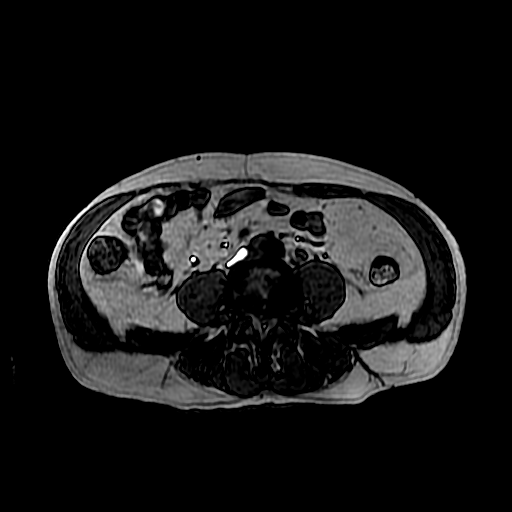
[im 37/110]
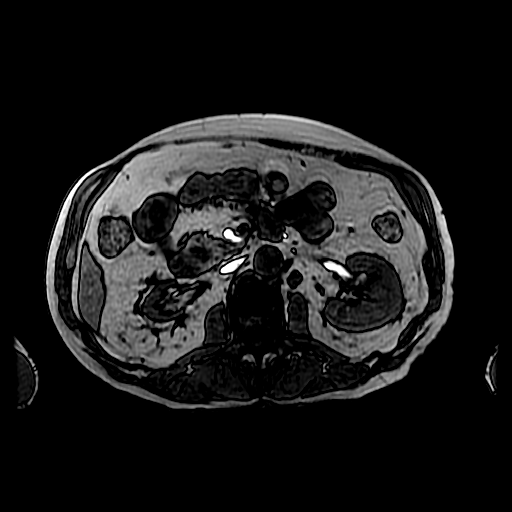
[im 73/110]
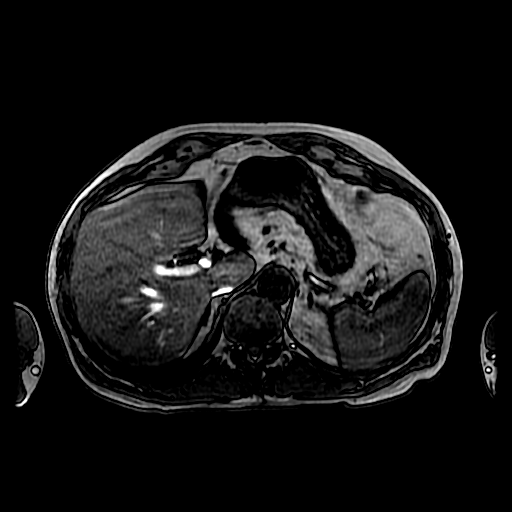
[im 110/110]
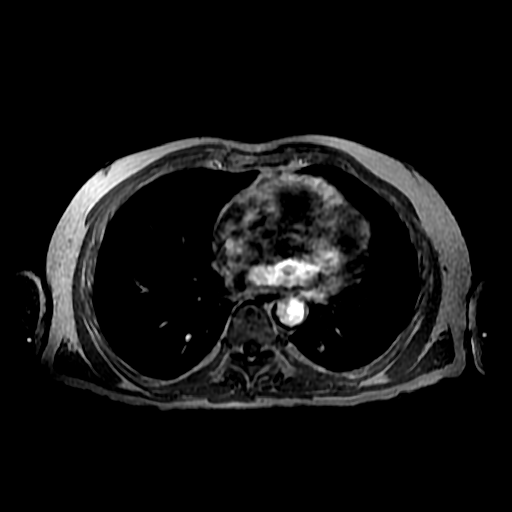

[Series 300: DWI · axial · 6.0mm · 1.72mm/px · 1 of 40 slices shown]
[im 1/40]
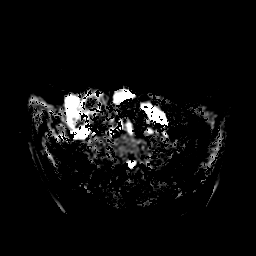

[Series 900: T1 dynamic · axial · 5.6mm · 0.78mm/px · z∈[-114,+129]mm · 3 of 88 slices shown (1 of 2)]
[im 1/88]
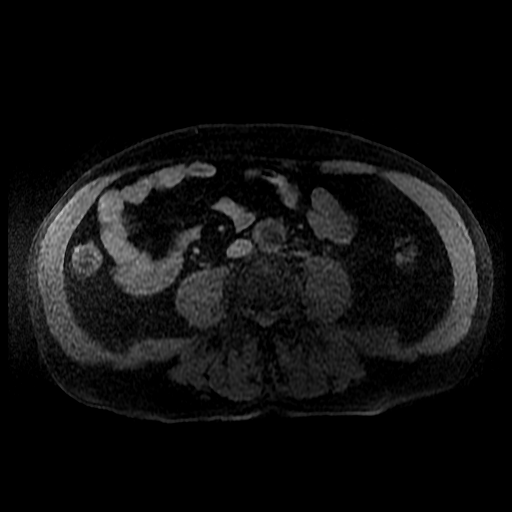
[im 44/88]
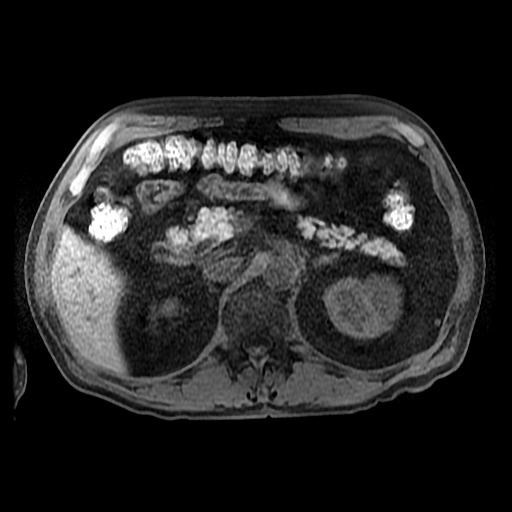
[im 88/88]
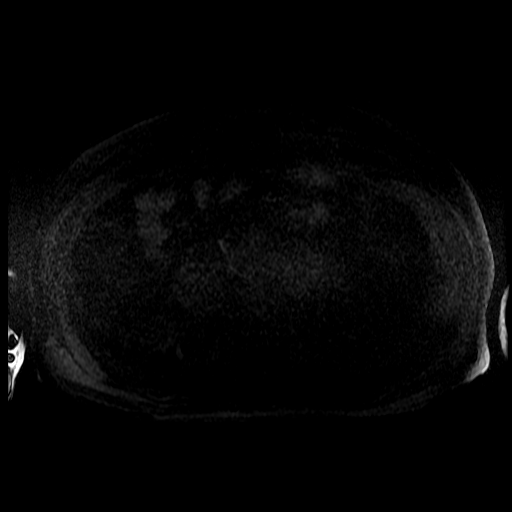

[Series 901: T1 dynamic · axial · 5.6mm · 0.78mm/px · 1 of 88 slices shown (2 of 2)]
[im 1/88]
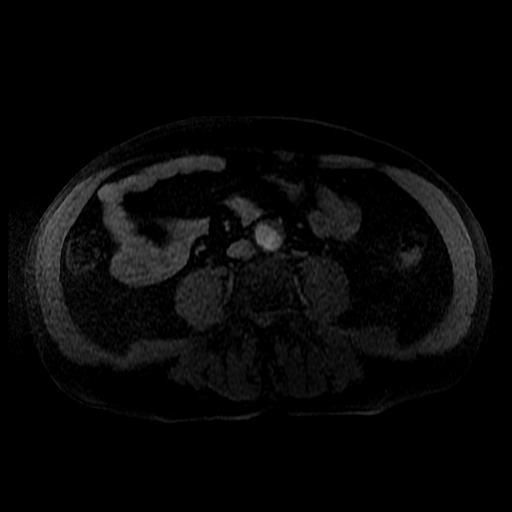

[19 of 48 positions shown; findings below may reference images not displayed]

FINDINGS: Lower chest: Unremarkable.

Hepatobiliary: 7 mm T1 hypointense, T2 hyperintense, nonenhancing
lesion in segment 2 of the liver compatible with a tiny cyst. No
other suspicious appearing hepatic lesions. No intra or extrahepatic
biliary ductal dilatation. Status post cholecystectomy.

Pancreas: No pancreatic mass. No pancreatic ductal dilatation. No
pancreatic or peripancreatic fluid or inflammatory changes.

Spleen:  Unremarkable.

Adrenals/Urinary Tract: Severe atrophy of the right kidney. In the
interpolar region there is a 2.7 cm lesion (axial image 39 of series
5) which is heterogeneous in signal intensity but predominantly T1
hypointense and T2 hypointense with internal areas of subtle
enhancement on post gadolinium images, highly concerning for primary
renal neoplasm such as a renal cell carcinoma. This is encapsulated
within Gerota's fascia and is well separate from the right renal
vein which is widely patent. In the left kidney there are multiple
lesions of varying degrees of complexity. Several of these lesions
are T1 hypointense, T2 hyperintense and do not enhance, compatible
with simple cysts, the largest of which measures 11 mm in the
anterior aspect of the upper pole. Other lesions demonstrate degrees
of T1 hyperintensity and T2 hypointensity, but do not enhance,
compatible with proteinaceous/hemorrhagic cysts, largest of which is
mildly proteinaceous in the upper pole measuring 11 mm. 1.9 x 1.5 cm
lesion in the right adrenal gland which demonstrates macroscopic
lipid, indicative of an adrenal myelolipoma. Left adrenal gland is
normal in appearance.

Stomach/Bowel: Normal appearance of the stomach. Large duodenal
diverticulum extending from the medial aspect of segment 2 of the
duodenum incidentally noted.

Vascular/Lymphatic: Severe aortic atherosclerosis with abundant
mural thrombus in the abdominal aorta redemonstrated. No
lymphadenopathy noted in the abdomen.

Other: No significant volume of ascites portions of the peritoneal
cavity. Noted in the visualized

Musculoskeletal: No aggressive appearing osseous lesions are noted
in the visualized portions of the skeleton.
IMPRESSION: 1. The lesion of concern in the interpolar region of the right
kidney measures 2.7 cm and has imaging characteristics compatible
with a small neoplasm, likely a renal cell carcinoma. This is
encapsulated within Gerota's fascia, does not involve the right
renal vein, and is not associated with lymphadenopathy or definite
signs of metastatic disease in the abdomen on today's examination.
2. Multiple cysts of varying degrees of complexity in the left
kidney ranging from Bosniak class 1 to Bosniak class 2.
3. Small right adrenal myelolipoma measuring 1.9 x 1.5 cm.
4. Severe aortic atherosclerosis.

## 2018-12-20 ENCOUNTER — Other Ambulatory Visit: Payer: Self-pay

## 2018-12-20 DIAGNOSIS — I745 Embolism and thrombosis of iliac artery: Secondary | ICD-10-CM

## 2018-12-20 DIAGNOSIS — I739 Peripheral vascular disease, unspecified: Secondary | ICD-10-CM

## 2018-12-21 ENCOUNTER — Other Ambulatory Visit (HOSPITAL_COMMUNITY): Payer: Self-pay | Admitting: Urology

## 2018-12-21 ENCOUNTER — Other Ambulatory Visit: Payer: Self-pay

## 2018-12-21 ENCOUNTER — Ambulatory Visit (HOSPITAL_COMMUNITY)
Admission: RE | Admit: 2018-12-21 | Discharge: 2018-12-21 | Disposition: A | Discharge status: Home / Self Care | Payer: Medicare Other | Source: Ambulatory Visit | Attending: Urology | Admitting: Urology

## 2018-12-21 DIAGNOSIS — R918 Other nonspecific abnormal finding of lung field: Secondary | ICD-10-CM | POA: Diagnosis not present

## 2018-12-21 DIAGNOSIS — C641 Malignant neoplasm of right kidney, except renal pelvis: Secondary | ICD-10-CM | POA: Diagnosis not present

## 2018-12-28 ENCOUNTER — Other Ambulatory Visit: Payer: Self-pay

## 2018-12-28 ENCOUNTER — Other Ambulatory Visit (HOSPITAL_COMMUNITY): Payer: Self-pay | Admitting: Urology

## 2018-12-28 ENCOUNTER — Ambulatory Visit (HOSPITAL_COMMUNITY)
Admission: RE | Admit: 2018-12-28 | Discharge: 2018-12-28 | Disposition: A | Discharge status: Home / Self Care | Payer: Medicare Other | Source: Ambulatory Visit | Attending: Urology | Admitting: Urology

## 2018-12-28 DIAGNOSIS — C641 Malignant neoplasm of right kidney, except renal pelvis: Secondary | ICD-10-CM | POA: Diagnosis not present

## 2018-12-28 DIAGNOSIS — N6459 Other signs and symptoms in breast: Secondary | ICD-10-CM | POA: Diagnosis not present

## 2019-01-03 ENCOUNTER — Encounter (HOSPITAL_COMMUNITY): Payer: Medicare Other

## 2019-01-03 ENCOUNTER — Ambulatory Visit: Payer: Medicare Other | Admitting: Vascular Surgery

## 2019-02-06 ENCOUNTER — Telehealth: Payer: Self-pay | Admitting: *Deleted

## 2019-02-06 NOTE — Telephone Encounter (Signed)
Unable to leave a message,mailbox is full. 

## 2019-03-13 ENCOUNTER — Other Ambulatory Visit: Payer: Self-pay | Admitting: *Deleted

## 2019-03-13 ENCOUNTER — Other Ambulatory Visit: Payer: Self-pay | Admitting: Vascular Surgery

## 2019-03-13 DIAGNOSIS — I739 Peripheral vascular disease, unspecified: Secondary | ICD-10-CM

## 2019-03-13 MED ORDER — CLOPIDOGREL BISULFATE 75 MG PO TABS: 75.0000 mg | ORAL_TABLET | Freq: Every day | ORAL | 3 refills | Status: DC

## 2019-03-28 ENCOUNTER — Other Ambulatory Visit: Payer: Self-pay

## 2019-03-28 ENCOUNTER — Ambulatory Visit (INDEPENDENT_AMBULATORY_CARE_PROVIDER_SITE_OTHER)
Admission: RE | Admit: 2019-03-28 | Discharge: 2019-03-28 | Disposition: A | Discharge status: Home / Self Care | Payer: Medicare Other | Source: Ambulatory Visit | Attending: Vascular Surgery | Admitting: Vascular Surgery

## 2019-03-28 ENCOUNTER — Ambulatory Visit (HOSPITAL_COMMUNITY)
Admission: RE | Admit: 2019-03-28 | Discharge: 2019-03-28 | Disposition: A | Discharge status: Home / Self Care | Payer: Medicare Other | Source: Ambulatory Visit | Attending: Vascular Surgery | Admitting: Vascular Surgery

## 2019-03-28 ENCOUNTER — Encounter: Payer: Self-pay | Admitting: Vascular Surgery

## 2019-03-28 ENCOUNTER — Ambulatory Visit (INDEPENDENT_AMBULATORY_CARE_PROVIDER_SITE_OTHER): Payer: Medicare Other | Admitting: Vascular Surgery

## 2019-03-28 VITALS — BP 132/82 | HR 54 | Temp 97.9°F | Resp 14 | Ht 74.0 in | Wt 188.1 lb

## 2019-03-28 DIAGNOSIS — I745 Embolism and thrombosis of iliac artery: Secondary | ICD-10-CM | POA: Diagnosis not present

## 2019-03-28 DIAGNOSIS — I739 Peripheral vascular disease, unspecified: Secondary | ICD-10-CM

## 2019-03-28 NOTE — Progress Notes (Signed)
Patient name: Jeffrey Stout MRN: [id number] DOB: 09/16/1950 Sex: male  REASON FOR VISIT: 37-month follow-up with surveillance  HPI: Jeffrey Stout is a 68 y.o. male with multiple medical problems that presents for follow-up at 74-month interval after previous left common and external iliac stenting and a left to right femoral-femoral bypass for right lower extremity claudication by Dr. Bridgett Larsson.  In review of the notes patient had left common iliac stenting by Dr. Bridgett Larsson on 02/15/2014.  Ultimately at a later date he underwent a left to right femoral-femoral bypass 04/11/2014.  Then he had a drug-coated balloon angioplasty of the left common iliac stent and extension of the stent into the external iliac artery on 11/12/2016.  All this was performed Dr. Bridgett Larsson.   On follow-up today he feels that his legs are doing fine.  Denies any rest pain, tissue loss, or new claudication symptoms.  Stilll smoking 1 pack/daily.  No other specific concerns.  Past Medical History:  Diagnosis Date  . AAA (abdominal aortic aneurysm) ([place]) 02/03/2018   3cm fusiform, noted on CT abd   . Adrenal myelolipoma 03/07/2018   Small, right 1.9x1.5cm, noted on MRI ABD  . Aortic atherosclerosis ([place]) 03/07/2018   Severe, noted on MRI ABD  . Cyst of left kidney 03/07/2018   Multiple, noted on MRI ABD  . Diverticulosis 02/03/2018   descending ang sigmoid, noted on CT abd   . GERD (gastroesophageal reflux disease)   . Grade I diastolic dysfunction 67/34/1937   Noted on ECHO   . H/O renal artery stenosis   . Hyperlipidemia   . Hypertension   . Intermittent claudication ([place])   . LVH (left ventricular hypertrophy) 04/04/2014   Mild, noted on ECHO  . Peripheral arterial disease ([place])   . Renal artery stenosis ([place])   . Right renal mass 03/07/2018   2.7 cm, noted on MRI ABD  . Thrombus of aorta ([place]) 02/03/2018   progressive mural, noted on CT abd   . TIA (transient ischemic attack) 04/2013   went to [facility] for evaluation     Past Surgical History:  Procedure Laterality Date  . ABDOMINAL AORTAGRAM N/A 02/15/2014   Procedure: ABDOMINAL Maxcine Ham;  Surgeon: Conrad Clifford, MD;  Location: [facility] CATH LAB;  Service: Cardiovascular;  Laterality: N/A;  . ABDOMINAL AORTOGRAM W/LOWER EXTREMITY N/A 11/12/2016   Procedure: Abdominal Aortogram w/Lower Extremity;  Surgeon: Conrad Crawford, MD;  Location: [place] CV LAB;  Service: Cardiovascular;  Laterality: N/A;  . CHOLECYSTECTOMY    . COLONOSCOPY    . FEMORAL-FEMORAL BYPASS GRAFT Bilateral 04/11/2014   Procedure: BYPASS GRAFT LEFT-RIGHT FEMORAL-FEMORAL ARTERY and bilateral femoral endarterectomies with Bovine Patch Angioplasties.;  Surgeon: Conrad Touchet, MD;  Location: [place];  Service: Vascular;  Laterality: Bilateral;  . LAPAROSCOPIC NEPHRECTOMY Right 05/26/2018   Procedure: LAPAROSCOPIC RADICAL NEPHRECTOMY;  Surgeon: Raynelle Bring, MD;  Location: [facility];  Service: Urology;  Laterality: Right;  . PERIPHERAL VASCULAR INTERVENTION  11/12/2016   Procedure: Peripheral Vascular Intervention;  Surgeon: Conrad Pinnacle, MD;  Location: [place] CV LAB;  Service: Cardiovascular;;  . RENAL ARTERY STENT Left 2015  . VASCULAR SURGERY      Family History  Problem Relation Age of Onset  . Diabetes Mother   . Hypertension Brother   . Heart attack Brother     SOCIAL HISTORY: Social History   Tobacco Use  . Smoking status: Current Every Day Smoker    Packs/day: 1.50    Years: 53.00  Pack years: 79.50    Types: Cigarettes  . Smokeless tobacco: Never Used  Substance Use Topics  . Alcohol use: No    Alcohol/week: 0.0 standard drinks    No Known Allergies  Current Outpatient Medications  Medication Sig Dispense Refill  . amLODipine (NORVASC) 10 MG tablet Take 10 mg by mouth 2 (two) times daily.     Marland Kitchen aspirin 81 MG tablet Take 81 mg by mouth daily. Reported on 11/01/2015    . benazepril (LOTENSIN) 40 MG tablet Take 40 mg by mouth every evening.     . clopidogrel (PLAVIX) 75 MG  tablet Take 1 tablet (75 mg total) by mouth daily. 90 tablet 3  . metoprolol (LOPRESSOR) 50 MG tablet Take 50 mg by mouth 2 (two) times daily.   5  . pravastatin (PRAVACHOL) 40 MG tablet Take 40 mg by mouth at bedtime.      No current facility-administered medications for this visit.     REVIEW OF SYSTEMS:  [X]  denotes positive finding, [ ]  denotes negative finding Cardiac  Comments:  Chest pain or chest pressure:    Shortness of breath upon exertion:    Short of breath when lying flat:    Irregular heart rhythm:        Vascular    Pain in calf, thigh, or hip brought on by ambulation:    Pain in feet at night that wakes you up from your sleep:     Blood clot in your veins:    Leg swelling:         Pulmonary    Oxygen at home:    Productive cough:     Wheezing:         Neurologic    Sudden weakness in arms or legs:     Sudden numbness in arms or legs:     Sudden onset of difficulty speaking or slurred speech:    Temporary loss of vision in one eye:     Problems with dizziness:         Gastrointestinal    Blood in stool:     Vomited blood:         Genitourinary    Burning when urinating:     Blood in urine:        Psychiatric    Major depression:         Hematologic    Bleeding problems:    Problems with blood clotting too easily:        Skin    Rashes or ulcers:        Constitutional    Fever or chills:      PHYSICAL EXAM: Vitals:   03/28/19 1131  BP: 132/82  Pulse: (!) 54  Resp: 14  Temp: 97.9 F (36.6 C)  TempSrc: Temporal  SpO2: 98%  Weight: 188 lb 1.6 oz (85.3 kg)  Height: 6\' 2"  (1.88 m)    GENERAL: The patient is a well-nourished male, in no acute distress. The vital signs are documented above. CARDIAC: There is a regular rate and rhythm.  VASCULAR:  2+ palpable bilateral femoral pulses, pulse in fem fem graft 2+ palpable right DP, 1+ palpable left DP PULMONARY: There is good air exchange bilaterally without wheezing or rales. ABDOMEN:  Soft and non-tender with normal pitched bowel sounds.  MUSCULOSKELETAL: There are no major deformities or cyanosis. NEUROLOGIC: No focal weakness or paresthesias are detected. SKIN: There are no ulcers or rashes noted.   DATA:   Precious  CT scans have shown fair amount mural thrombus in his infrarenal aorta.  ABIs today are 0.9 on the right triphasic waveform and 0.8 on the left with triphasic waveform.  Fem fem bypass widely patent  His aortoiliac duplex shows stenosis in of the left iliac stents with a velocity of 285 mid stent and 357 distal stent (previously 362/421)   Assessment/Plan:  68 year old male that presents for interval surveillance after left iliac stenting with a left to right femoral-femoral bypass by Dr. Bridgett Larsson for right lower extremity claudication.  He feels his legs are very stable with no symptoms at this time.  He does have palpable pedal pulses in the dorsalis pedis bilaterally and a pulse in his fem fem graft as well as palpable femoral pulses.  I previously discussed my concern for stenosis of his donor artery on the left within the iliac stent.  He had previously wanted to avoid additional intervention unless absolutely necessary.  Given that his velocities have not increased over the last 3 months and have actually decreased slightly and he otherwise remains asymptomatic we have decided to continue surveillance for now.  I will see him back in 6 months with repeat duplex imaging.  Discussed the importance of smoking cessation.   Marty Heck, MD [facility] Office: [phone] Pager: [phone]

## 2019-03-30 ENCOUNTER — Ambulatory Visit (INDEPENDENT_AMBULATORY_CARE_PROVIDER_SITE_OTHER): Payer: Medicare Other | Admitting: Physician Assistant

## 2019-03-30 ENCOUNTER — Other Ambulatory Visit: Payer: Self-pay

## 2019-03-30 VITALS — BP 114/70 | HR 64 | Temp 98.1°F | Ht 74.0 in | Wt 187.2 lb

## 2019-03-30 DIAGNOSIS — I700 Atherosclerosis of aorta: Secondary | ICD-10-CM | POA: Diagnosis not present

## 2019-03-30 DIAGNOSIS — I714 Abdominal aortic aneurysm, without rupture, unspecified: Secondary | ICD-10-CM

## 2019-03-30 DIAGNOSIS — I739 Peripheral vascular disease, unspecified: Secondary | ICD-10-CM | POA: Diagnosis not present

## 2019-03-30 DIAGNOSIS — E785 Hyperlipidemia, unspecified: Secondary | ICD-10-CM | POA: Diagnosis not present

## 2019-03-30 DIAGNOSIS — I745 Embolism and thrombosis of iliac artery: Secondary | ICD-10-CM

## 2019-03-30 DIAGNOSIS — I10 Essential (primary) hypertension: Secondary | ICD-10-CM

## 2019-03-30 NOTE — Progress Notes (Signed)
Cardiology Office Note    Date:  04/01/2019   ID:  Jeffrey Stout, DOB [birth year], MRN [id number]  PCP:  Jaynee Eagles, MD  Cardiologist:  Dr. Gwenlyn Found  Chief Complaint  Patient presents with  . Follow-up    seen for Dr. Gwenlyn Found.    History of Present Illness:  Jeffrey Stout is a 68 y.o. male with PMH of AAA, adrenal myelolipoma, hypertension, hyperlipidemia, PAD, history of TIA, renal artery stenosis and carotid artery disease.  He has a history of tobacco abuse.  Echocardiogram on 03/30/2014 was normal.  Myoview obtained in August 2015 as part of his preoperative clearance prior to vascular procedure was normal.  CTA of the abdomen pelvis obtained on 02/03/2018 showed a 3 cm fusiform abdominal aortic aneurysm, 2.4 cm right renal mass, progressive mural thrombus in the aorta without high-grade stenosis.  Patent left renal, left common and external iliac artery stents.  Last Myoview obtained on 04/01/2018 was low risk.  He underwent right radical laparoscopic nephrectomy.  Last ABI obtained on 03/28/2019 showed left ABI 0.8, right ABI 0.9.  Lower extremity Doppler showed left and right femoral artery-femoral artery bypass graft patent with no evidence of stenosis.  Patient presents today for cardiology office visit.  He denies any recent chest pain, lower extremity edema, orthopnea or PND.  He still has some weakness in the leg after walking long distance.  He denies any obvious leg pain.  He is not on any medication for mural thrombus in the aorta that was seen on previous CT scan 02/03/2018.  He is overdue for fasting lipid panel.  Otherwise I think he is stable from cardiology perspective.   Past Medical History:  Diagnosis Date  . AAA (abdominal aortic aneurysm) ([place]) 02/03/2018   3cm fusiform, noted on CT abd   . Adrenal myelolipoma 03/07/2018   Small, right 1.9x1.5cm, noted on MRI ABD  . Aortic atherosclerosis ([place]) 03/07/2018   Severe, noted on MRI ABD  . Cyst of left kidney 03/07/2018   Multiple, noted on MRI ABD  . Diverticulosis 02/03/2018   descending ang sigmoid, noted on CT abd   . GERD (gastroesophageal reflux disease)   . Grade I diastolic dysfunction 77/82/4235   Noted on ECHO   . H/O renal artery stenosis   . Hyperlipidemia   . Hypertension   . Intermittent claudication ([place])   . LVH (left ventricular hypertrophy) 04/04/2014   Mild, noted on ECHO  . Peripheral arterial disease ([place])   . Renal artery stenosis ([place])   . Right renal mass 03/07/2018   2.7 cm, noted on MRI ABD  . Thrombus of aorta ([place]) 02/03/2018   progressive mural, noted on CT abd   . TIA (transient ischemic attack) 04/2013   went to [facility] for evaluation    Past Surgical History:  Procedure Laterality Date  . ABDOMINAL AORTAGRAM N/A 02/15/2014   Procedure: ABDOMINAL Maxcine Ham;  Surgeon: Conrad Gaston, MD;  Location: [facility] CATH LAB;  Service: Cardiovascular;  Laterality: N/A;  . ABDOMINAL AORTOGRAM W/LOWER EXTREMITY N/A 11/12/2016   Procedure: Abdominal Aortogram w/Lower Extremity;  Surgeon: Conrad [place], MD;  Location: [place] CV LAB;  Service: Cardiovascular;  Laterality: N/A;  . CHOLECYSTECTOMY    . COLONOSCOPY    . FEMORAL-FEMORAL BYPASS GRAFT Bilateral 04/11/2014   Procedure: BYPASS GRAFT LEFT-RIGHT FEMORAL-FEMORAL ARTERY and bilateral femoral endarterectomies with Bovine Patch Angioplasties.;  Surgeon: Conrad , MD;  Location: [place];  Service: Vascular;  Laterality: Bilateral;  .  LAPAROSCOPIC NEPHRECTOMY Right 05/26/2018   Procedure: LAPAROSCOPIC RADICAL NEPHRECTOMY;  Surgeon: Raynelle Bring, MD;  [facility];  Service: Urology;  Laterality: Right;  . PERIPHERAL VASCULAR INTERVENTION  11/12/2016   Procedure: Peripheral Vascular Intervention;  Surgeon: Conrad Aynor, MD;  Location: [place] CV LAB;  Service: Cardiovascular;;  . RENAL ARTERY STENT Left 2015  . VASCULAR SURGERY      Current Medications: Outpatient Medications Prior to Visit  Medication Sig Dispense  Refill  . amLODipine (NORVASC) 10 MG tablet Take 10 mg by mouth 2 (two) times daily.     Marland Kitchen aspirin 81 MG tablet Take 81 mg by mouth daily. Reported on 11/01/2015    . benazepril (LOTENSIN) 40 MG tablet Take 40 mg by mouth every evening.     . clopidogrel (PLAVIX) 75 MG tablet Take 1 tablet (75 mg total) by mouth daily. 90 tablet 3  . metoprolol (LOPRESSOR) 50 MG tablet Take 50 mg by mouth 2 (two) times daily.   5  . pravastatin (PRAVACHOL) 40 MG tablet Take 40 mg by mouth at bedtime.      No facility-administered medications prior to visit.      Allergies:   Patient has no known allergies.   Social History   Socioeconomic History  . Marital status: Married    Spouse name: Not on file  . Number of children: Not on file  . Years of education: Not on file  . Highest education level: Not on file  Occupational History  . Occupation: retired  Scientific laboratory technician  . Financial resource strain: Not on file  . Food insecurity    Worry: Not on file    Inability: Not on file  . Transportation needs    Medical: Not on file    Non-medical: Not on file  Tobacco Use  . Smoking status: Current Every Day Smoker    Packs/day: 1.50    Years: 53.00    Pack years: 79.50    Types: Cigarettes  . Smokeless tobacco: Never Used  Substance and Sexual Activity  . Alcohol use: No    Alcohol/week: 0.0 standard drinks  . Drug use: No  . Sexual activity: Not on file  Lifestyle  . Physical activity    Days per week: Not on file    Minutes per session: Not on file  . Stress: Not on file  Relationships  . Social Herbalist on phone: Not on file    Gets together: Not on file    Attends religious service: Not on file    Active member of club or organization: Not on file    Attends meetings of clubs or organizations: Not on file    Relationship status: Not on file  Other Topics Concern  . Not on file  Social History Narrative  . Not on file     Family History:  The patient's family history  includes Diabetes in his mother; Heart attack in his brother; Hypertension in his brother.   ROS:   Please see the history of present illness.    ROS All other systems reviewed and are negative.   PHYSICAL EXAM:   VS:  BP 114/70   Pulse 64   Temp 98.1 F (36.7 C) (Temporal)   Ht 6\' 2"  (1.88 m)   Wt 187 lb 3.2 oz (84.9 kg)   SpO2 96%   BMI 24.04 kg/m    GEN: Well nourished, well developed, in no acute distress  HEENT: normal  Neck: no JVD, carotid bruits, or masses Cardiac: RRR; no murmurs, rubs, or gallops,no edema  Respiratory:  clear to auscultation bilaterally, normal work of breathing GI: soft, nontender, nondistended, + BS MS: no deformity or atrophy  Skin: warm and dry, no rash Neuro:  Alert and Oriented x 3, Strength and sensation are intact Psych: euthymic mood, full affect  Wt Readings from Last 3 Encounters:  03/30/19 187 lb 3.2 oz (84.9 kg)  03/28/19 188 lb 1.6 oz (85.3 kg)  10/04/18 178 lb (80.7 kg)      Studies/Labs Reviewed:   EKG:  EKG is ordered today.  The ekg ordered today demonstrates sinus rhythm without significant ST-T wave changes.  Recent Labs: 05/27/2018: BUN 14; Creatinine, Ser 1.38; Hemoglobin 15.7; Platelets 135; Potassium 4.3; Sodium 138   Lipid Panel    Component Value Date/Time   CHOL 154 03/29/2018 0904   TRIG 276 (H) 03/29/2018 0904   HDL 28 (L) 03/29/2018 0904   CHOLHDL 5.5 (H) 03/29/2018 0904   CHOLHDL 3.5 03/20/2014 1044   VLDL 33 03/20/2014 1044   LDLCALC 71 03/29/2018 0904    Additional studies/ records that were reviewed today include:   Myoview 04/01/2018 Study Highlights   Nuclear stress EF: 50%.  The left ventricular ejection fraction is mildly decreased (45-54%).  There was no ST segment deviation noted during stress.  This is a low risk study due to reduced systolic function.    Renal artery doppler 11/06/2016     CTA abdomen pelvis 02/03/2018 IMPRESSION: VASCULAR  1. Progressive mural thrombus in  the aorta without high-grade stenosis. 2. 3 cm fusiform abdominal aortic aneurysm. 3. Interval thrombosis of right common iliac artery and placement of patent fem-fem bypass graft. 4. Patent left renal, left common and external iliac arterial stents.  NON-VASCULAR  1. New 2.4 cm right renal mass. Consider further evaluation with dedicated abdominal MRI with contrast to differentiate neoplasm from proteinaceous cyst. 2. Progressive right renal parenchymal atrophy 3. Descending and sigmoid diverticulosis.   ASSESSMENT:    1. Atherosclerosis of aorta ([place])   2. Hyperlipidemia, unspecified hyperlipidemia type   3. AAA (abdominal aortic aneurysm) without rupture ([place])   4. Essential hypertension   5. PAD (peripheral artery disease) ([facility])      PLAN:  In order of problems listed above:  1. Aortic atherosclerosis: Previous CT angiogram of abdomen pelvis obtained on 02/03/2018 also revealed progressive mural thrombus in the aorta without high-grade stenosis.  Unclear if this required treatment with systemic anticoagulation therapy.  The image was obtained over a year ago  2. AAA: 3 cm fusiform abdominal aortic aneurysm noted on previous CT angiogram of abdomen  3. Hypertension: Blood pressure well controlled  4. PAD: Lower extremity ABI stable  5. Hyperlipidemia: Continue statin    Medication Adjustments/Labs and Tests Ordered: Current medicines are reviewed at length with the patient today.  Concerns regarding medicines are outlined above.  Medication changes, Labs and Tests ordered today are listed in the Patient Instructions below. Patient Instructions  Medication Instructions:  Your physician recommends that you continue on your current medications as directed. Please refer to the Current Medication list given to you today.  If you need a refill on your cardiac medications before your next appointment, please call your pharmacy.   Lab work: You will need to have labs  (blood work) drawn in 1-2 months:  Fasting Lipid Panel-DO NOT EAT OR DRINK past midnight   If you have labs (blood work) drawn today and  your tests are completely normal, you will receive your results only by: Marland Kitchen MyChart Message (if you have MyChart) OR . A paper copy in the mail If you have any lab test that is abnormal or we need to change your treatment, we will call you to review the results.  Testing/Procedures: NONE ordered at this time of appointment   Follow-Up: At [facility], you and your health needs are our priority.  As part of our continuing mission to provide you with exceptional heart care, we have created designated Provider Care Teams.  These Care Teams include your primary Cardiologist (physician) and Advanced Practice Providers (APPs -  Physician Assistants and Nurse Practitioners) who all work together to provide you with the care you need, when you need it. You will need a follow up appointment in 6 months.  Please call our office 2 months in advance to schedule this appointment.  You may see Quay Burow, MD or one of the following Advanced Practice Providers on your designated Care Team:   Kerin Ransom, PA-C Roby Lofts, [place] . Sande Rives, PA-C  Any Other Special Instructions Will Be Listed Below (If Applicable).       Hilbert Corrigan, [place]  04/01/2019 1:55 PM    [facility] [place], [place], [place]  [address] Phone: [phone]; Fax: [phone]

## 2019-03-30 NOTE — Patient Instructions (Addendum)
Medication Instructions:  Your physician recommends that you continue on your current medications as directed. Please refer to the Current Medication list given to you today.  If you need a refill on your cardiac medications before your next appointment, please call your pharmacy.   Lab work: You will need to have labs (blood work) drawn in 1-2 months:  Fasting Lipid Panel-DO NOT EAT OR DRINK past midnight   If you have labs (blood work) drawn today and your tests are completely normal, you will receive your results only by: Marland Kitchen MyChart Message (if you have MyChart) OR . A paper copy in the mail If you have any lab test that is abnormal or we need to change your treatment, we will call you to review the results.  Testing/Procedures: NONE ordered at this time of appointment   Follow-Up: At [facility], you and your health needs are our priority.  As part of our continuing mission to provide you with exceptional heart care, we have created designated Provider Care Teams.  These Care Teams include your primary Cardiologist (physician) and Advanced Practice Providers (APPs -  Physician Assistants and Nurse Practitioners) who all work together to provide you with the care you need, when you need it. You will need a follow up appointment in 6 months.  Please call our office 2 months in advance to schedule this appointment.  You may see Quay Burow, MD or one of the following Advanced Practice Providers on your designated Care Team:   Kerin Ransom, PA-C Roby Lofts, [place] . Sande Rives, PA-C  Any Other Special Instructions Will Be Listed Below (If Applicable).

## 2019-04-01 ENCOUNTER — Encounter: Payer: Self-pay | Admitting: Physician Assistant

## 2019-04-25 DIAGNOSIS — Z8679 Personal history of other diseases of the circulatory system: Secondary | ICD-10-CM | POA: Diagnosis not present

## 2019-04-25 DIAGNOSIS — I129 Hypertensive chronic kidney disease with stage 1 through stage 4 chronic kidney disease, or unspecified chronic kidney disease: Secondary | ICD-10-CM | POA: Diagnosis not present

## 2019-04-25 DIAGNOSIS — N183 Chronic kidney disease, stage 3 (moderate): Secondary | ICD-10-CM | POA: Diagnosis not present

## 2019-04-25 DIAGNOSIS — Z905 Acquired absence of kidney: Secondary | ICD-10-CM | POA: Diagnosis not present

## 2019-04-25 DIAGNOSIS — Z720 Tobacco use: Secondary | ICD-10-CM | POA: Diagnosis not present

## 2019-04-25 DIAGNOSIS — Z23 Encounter for immunization: Secondary | ICD-10-CM | POA: Diagnosis not present

## 2019-07-17 DIAGNOSIS — C641 Malignant neoplasm of right kidney, except renal pelvis: Secondary | ICD-10-CM | POA: Diagnosis not present

## 2019-07-24 DIAGNOSIS — C641 Malignant neoplasm of right kidney, except renal pelvis: Secondary | ICD-10-CM | POA: Diagnosis not present

## 2019-07-24 DIAGNOSIS — R918 Other nonspecific abnormal finding of lung field: Secondary | ICD-10-CM | POA: Diagnosis not present

## 2019-07-24 DIAGNOSIS — K5730 Diverticulosis of large intestine without perforation or abscess without bleeding: Secondary | ICD-10-CM | POA: Diagnosis not present

## 2019-07-28 DIAGNOSIS — C641 Malignant neoplasm of right kidney, except renal pelvis: Secondary | ICD-10-CM | POA: Diagnosis not present

## 2019-08-03 IMAGING — DX CHEST - 2 VIEW
2 series · 2 of 2 positions shown · non-contrast
Comparison: March 22, 2018

CLINICAL DATA: History of renal cell carcinoma on the right.
Hypertension.

EXAM:
CHEST - 2 VIEW

[chest pa]
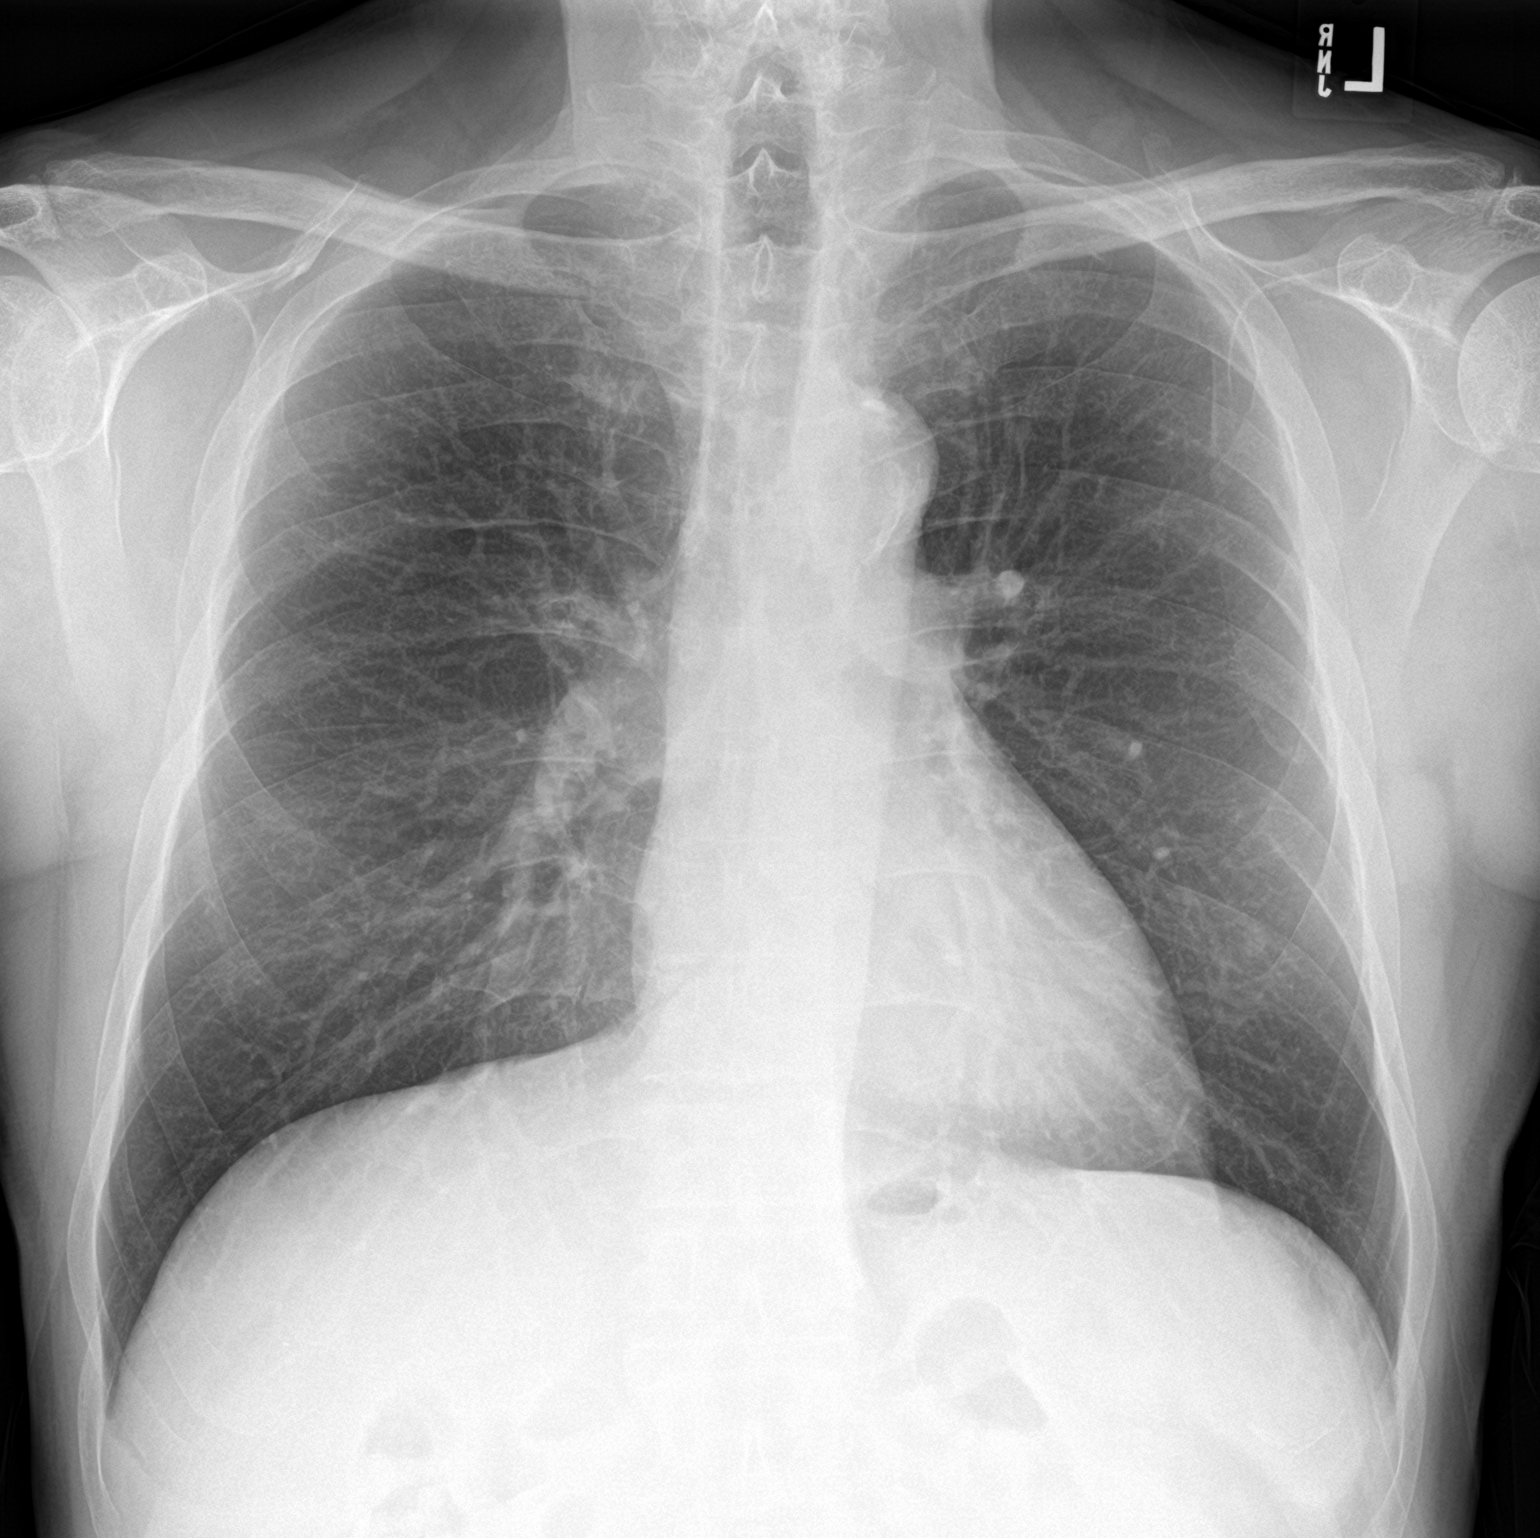

[chest lat]
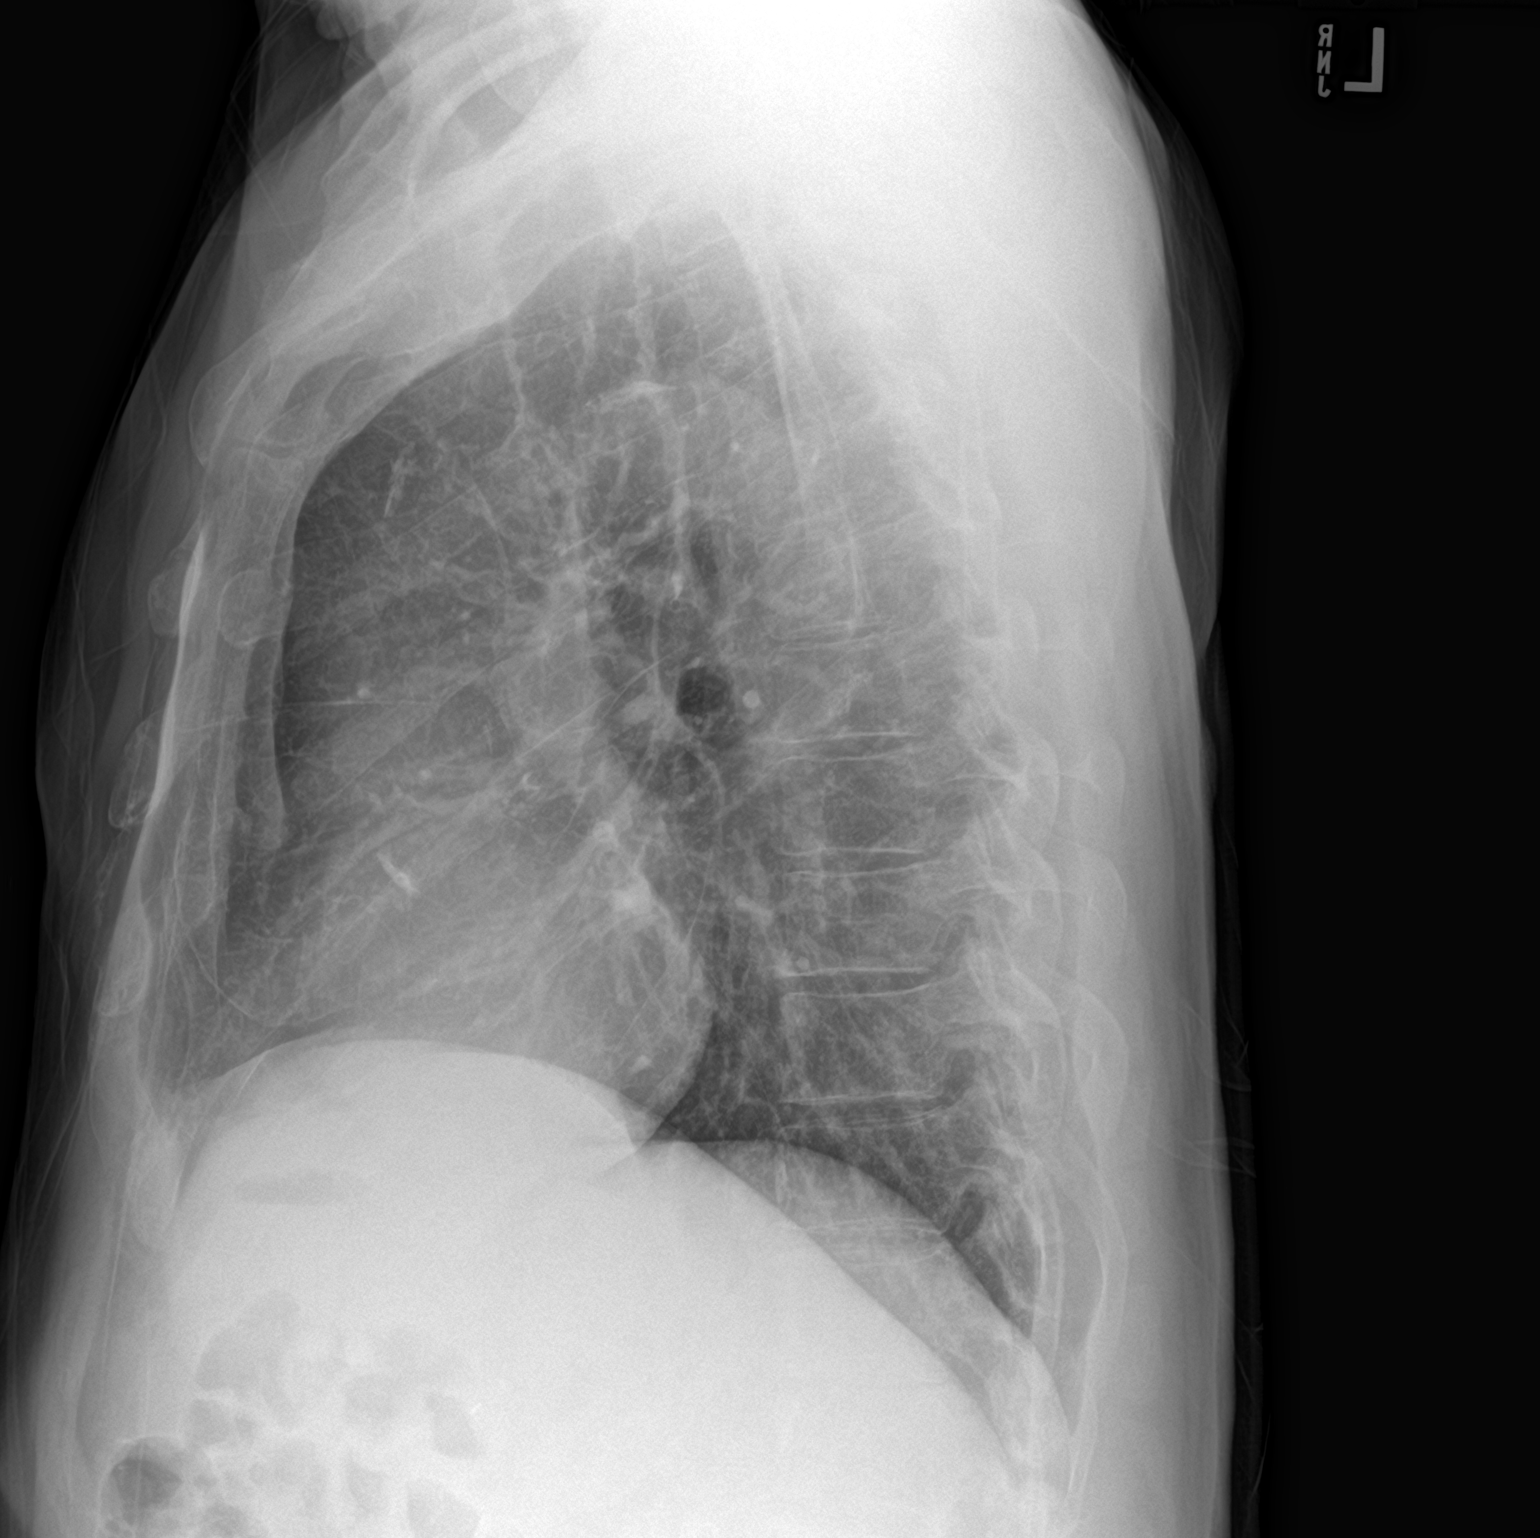

[2 of 2 positions shown; findings below may reference images not displayed]

FINDINGS: There is a probable nipple shadow on the left, also noted on
previous study. No edema or consolidation. No nodular opacity seen
beyond the suspected nipple shadow on the left. Heart size and
pulmonary vascularity are normal. No adenopathy. There is aortic
atherosclerosis. No bone lesions.
IMPRESSION: Suspected nipple shadow on the left; advise repeat study with nipple
markers to confirm. Lungs elsewhere clear. No adenopathy. Heart size
normal. Aortic Atherosclerosis ([id number]).

## 2019-08-10 IMAGING — CR CHEST - 2 VIEW
2 series · 2 of 2 positions shown · non-contrast
Comparison: 12/21/2018 and 03/22/2018

CLINICAL DATA: Repeat chest x-ray with nipple markers.

EXAM:
CHEST - 2 VIEW

[w chest pa]
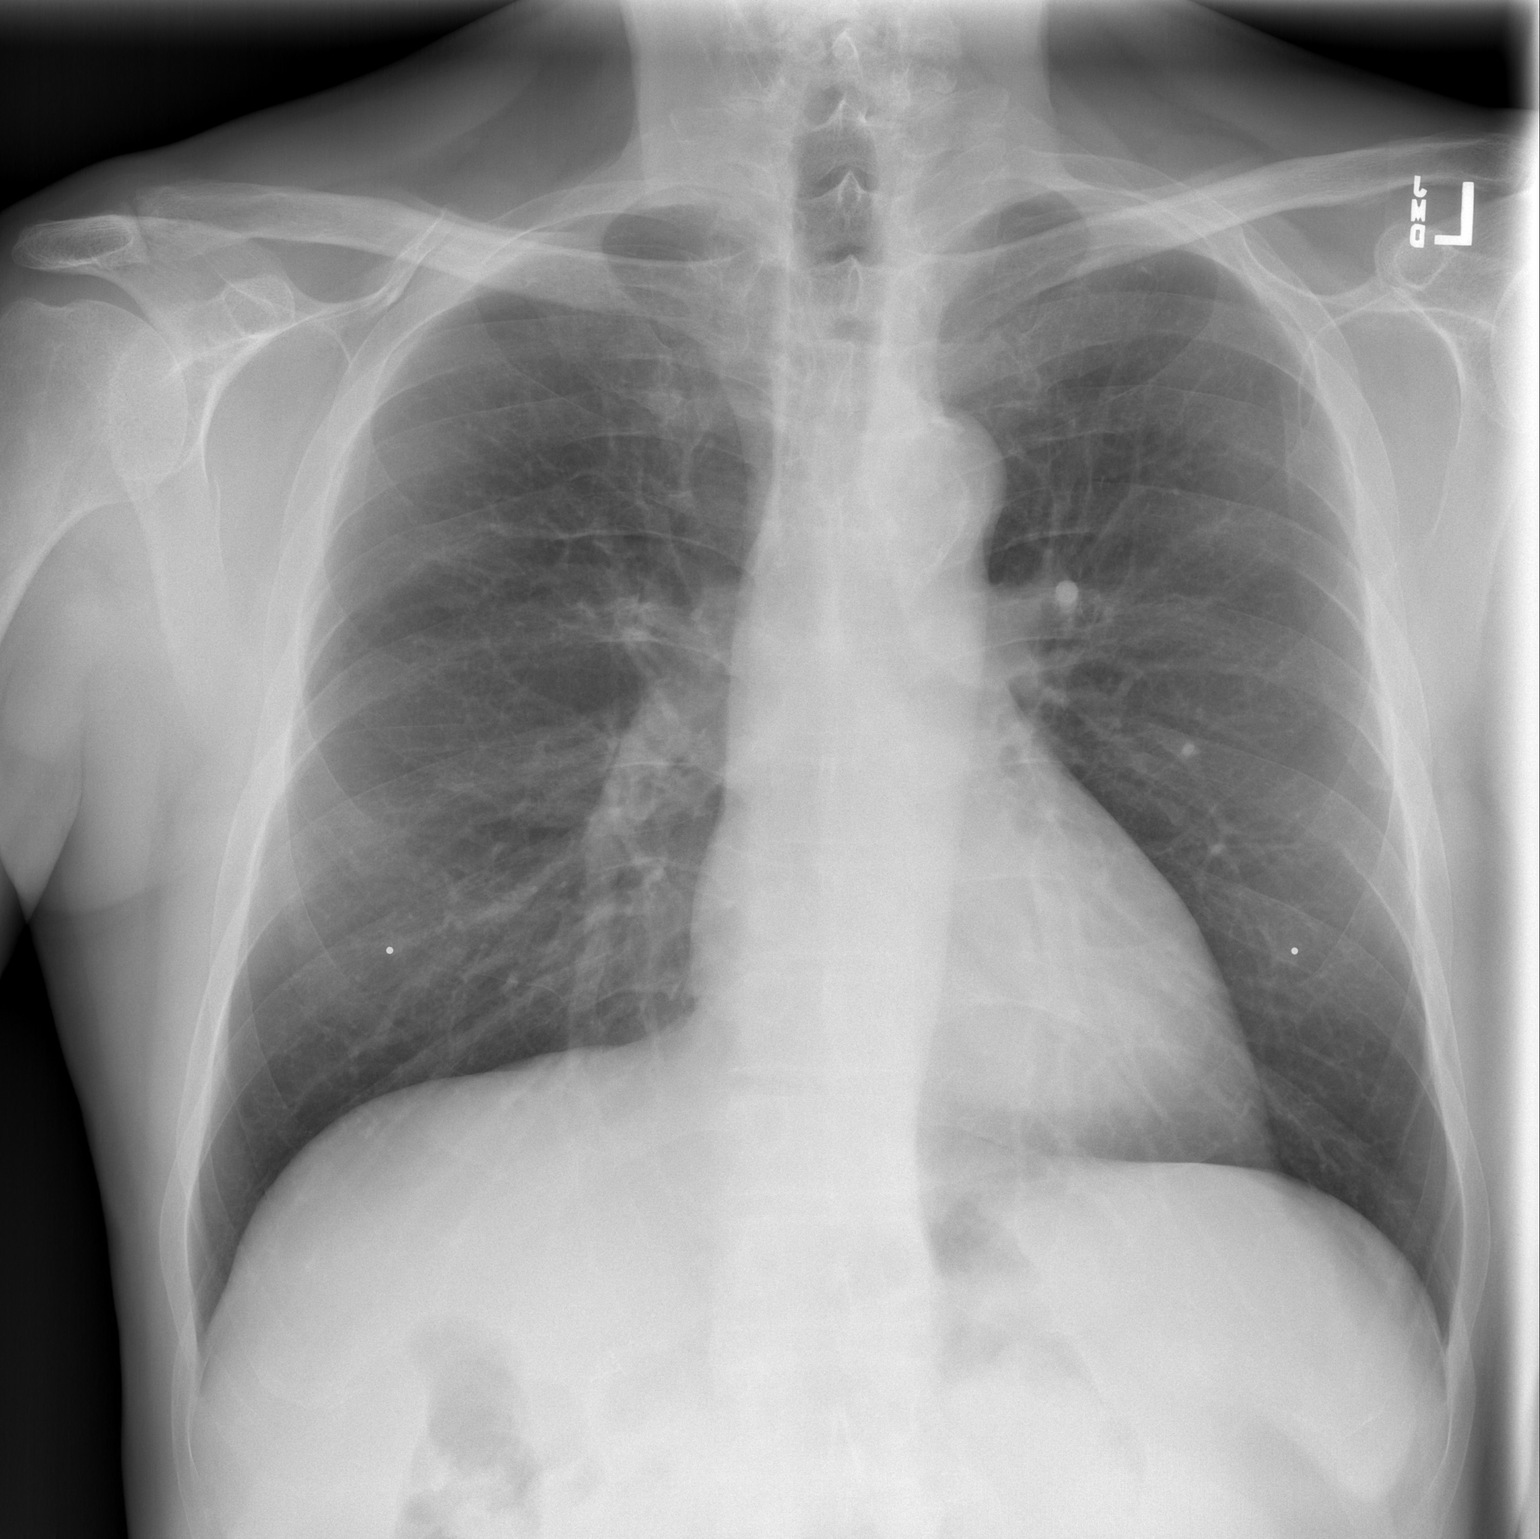

[w chest lat]
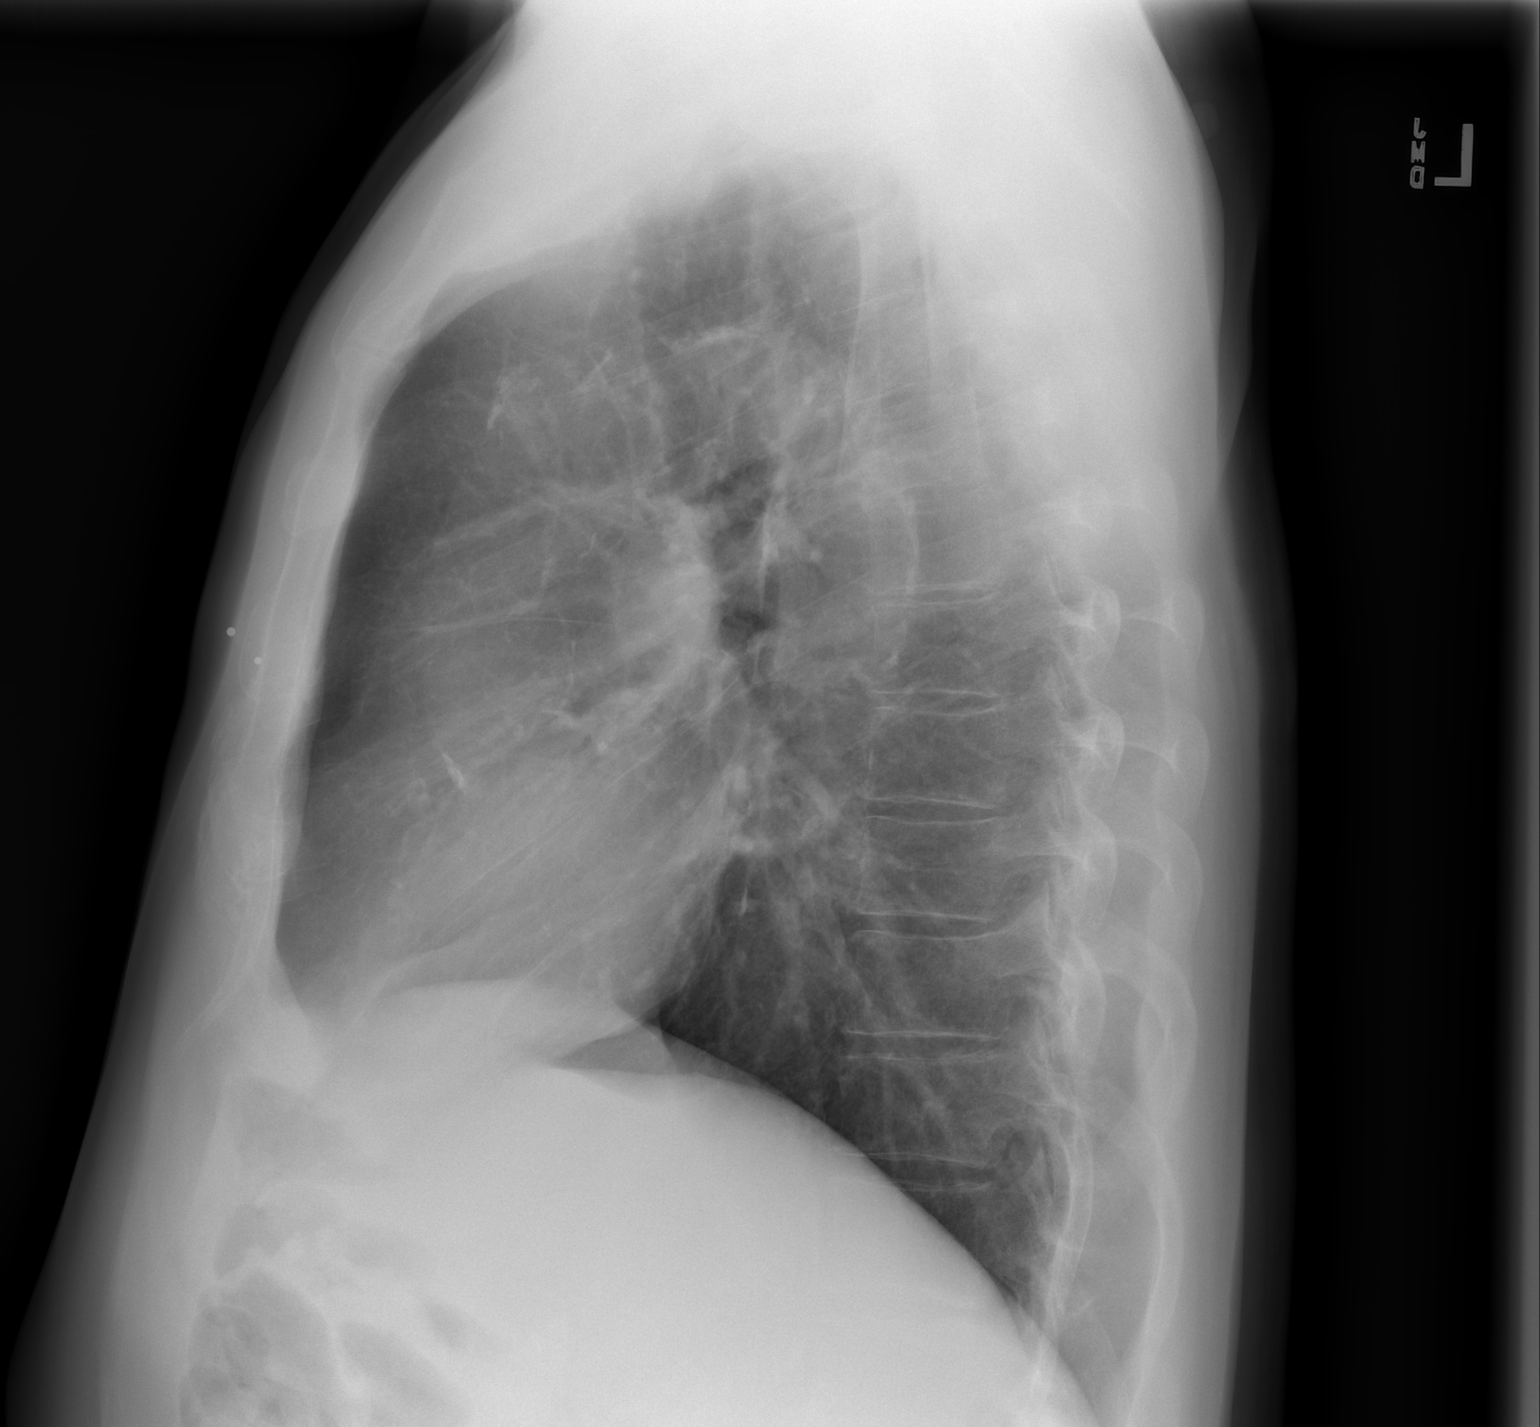

[2 of 2 positions shown; findings below may reference images not displayed]

FINDINGS: Lungs are adequately inflated and otherwise clear. No focal nodular
opacity over the left base as the nipple marker corresponds to the
area of the previously seen questionable nodular opacity likely
representing the left nipple. Cardiomediastinal silhouette and
remainder of the exam is unchanged.
IMPRESSION: No active cardiopulmonary disease.

## 2019-10-02 ENCOUNTER — Other Ambulatory Visit: Payer: Self-pay

## 2019-10-02 DIAGNOSIS — I745 Embolism and thrombosis of iliac artery: Secondary | ICD-10-CM

## 2019-10-02 DIAGNOSIS — I739 Peripheral vascular disease, unspecified: Secondary | ICD-10-CM

## 2019-10-03 ENCOUNTER — Encounter: Payer: Self-pay | Admitting: Vascular Surgery

## 2019-10-03 ENCOUNTER — Ambulatory Visit (INDEPENDENT_AMBULATORY_CARE_PROVIDER_SITE_OTHER): Payer: Medicare Other | Admitting: Vascular Surgery

## 2019-10-03 ENCOUNTER — Other Ambulatory Visit: Payer: Self-pay

## 2019-10-03 ENCOUNTER — Ambulatory Visit (HOSPITAL_COMMUNITY)
Admission: RE | Admit: 2019-10-03 | Discharge: 2019-10-03 | Disposition: A | Discharge status: Home / Self Care | Payer: Medicare Other | Source: Ambulatory Visit | Attending: Surgery | Admitting: Surgery

## 2019-10-03 VITALS — BP 162/95 | HR 54 | Temp 97.2°F | Resp 20 | Ht 74.0 in | Wt 184.0 lb

## 2019-10-03 DIAGNOSIS — I745 Embolism and thrombosis of iliac artery: Secondary | ICD-10-CM | POA: Diagnosis not present

## 2019-10-03 DIAGNOSIS — I70211 Atherosclerosis of native arteries of extremities with intermittent claudication, right leg: Secondary | ICD-10-CM | POA: Diagnosis not present

## 2019-10-03 DIAGNOSIS — I739 Peripheral vascular disease, unspecified: Secondary | ICD-10-CM | POA: Insufficient documentation

## 2019-10-03 NOTE — Progress Notes (Signed)
Patient name: Jeffrey Stout MRN: [id number] DOB: [birth year] Sex: male  REASON FOR VISIT: 54-month follow-up with surveillance  HPI: Jeffrey Stout is a 69 y.o. male with multiple medical problems that presents for follow-up at 85-month interval after previous left common and external iliac stenting and a left to right femoral-femoral bypass for right lower extremity claudication by Dr. Bridgett Larsson.  In review of the notes patient had left common iliac stenting by Dr. Bridgett Larsson on 02/15/2014.  Ultimately at a later date he underwent a left to right femoral-femoral bypass 04/11/2014.  Then he had a drug-coated balloon angioplasty of the left common iliac stent and extension of the stent into the external iliac artery on 11/12/2016.  All this was performed Dr. Bridgett Larsson.    On follow-up today continues to state that his legs are doing fine.  No claudication, rest pain, tissue loss, etc.  We previously been watching some stenosis in the donor artery in the left iliac that has previously been stented.  Still smoking about 15 to 20 cigarettes a day.  Past Medical History:  Diagnosis Date  . AAA (abdominal aortic aneurysm) ([place]) 02/03/2018   3cm fusiform, noted on CT abd   . Adrenal myelolipoma 03/07/2018   Small, right 1.9x1.5cm, noted on MRI ABD  . Aortic atherosclerosis ([place]) 03/07/2018   Severe, noted on MRI ABD  . Cyst of left kidney 03/07/2018   Multiple, noted on MRI ABD  . Diverticulosis 02/03/2018   descending ang sigmoid, noted on CT abd   . GERD (gastroesophageal reflux disease)   . Grade I diastolic dysfunction [id number]   Noted on ECHO   . H/O renal artery stenosis   . Hyperlipidemia   . Hypertension   . Intermittent claudication ([place])   . LVH (left ventricular hypertrophy) 04/04/2014   Mild, noted on ECHO  . Peripheral arterial disease ([place])   . Renal artery stenosis ([place])   . Right renal mass 03/07/2018   2.7 cm, noted on MRI ABD  . Thrombus of aorta ([place]) 02/03/2018   progressive mural, noted  on CT abd   . TIA (transient ischemic attack) 04/2013   went to [facility] for evaluation    Past Surgical History:  Procedure Laterality Date  . ABDOMINAL AORTAGRAM N/A 02/15/2014   Procedure: ABDOMINAL Maxcine Ham;  Surgeon: Conrad Weld, MD;  Location: [facility] CATH LAB;  Service: Cardiovascular;  Laterality: N/A;  . ABDOMINAL AORTOGRAM W/LOWER EXTREMITY N/A 11/12/2016   Procedure: Abdominal Aortogram w/Lower Extremity;  Surgeon: Conrad [place], MD;  Location: [place] CV LAB;  Service: Cardiovascular;  Laterality: N/A;  . CHOLECYSTECTOMY    . COLONOSCOPY    . FEMORAL-FEMORAL BYPASS GRAFT Bilateral 04/11/2014   Procedure: BYPASS GRAFT LEFT-RIGHT FEMORAL-FEMORAL ARTERY and bilateral femoral endarterectomies with Bovine Patch Angioplasties.;  Surgeon: Conrad [place], MD;  Location: [place];  Service: Vascular;  Laterality: Bilateral;  . LAPAROSCOPIC NEPHRECTOMY Right 05/26/2018   Procedure: LAPAROSCOPIC RADICAL NEPHRECTOMY;  Surgeon: Raynelle Bring, MD;  Location: [facility];  Service: Urology;  Laterality: Right;  . PERIPHERAL VASCULAR INTERVENTION  11/12/2016   Procedure: Peripheral Vascular Intervention;  Surgeon: Conrad Wheaton, MD;  Location: [place] CV LAB;  Service: Cardiovascular;;  . RENAL ARTERY STENT Left 2015  . VASCULAR SURGERY      Family History  Problem Relation Age of Onset  . Diabetes Mother   . Hypertension Brother   . Heart attack Brother     SOCIAL HISTORY: Social History   Tobacco Use  .  Smoking status: Current Every Day Smoker    Packs/day: 1.50    Years: 53.00    Pack years: 79.50    Types: Cigarettes  . Smokeless tobacco: Never Used  Substance Use Topics  . Alcohol use: No    Alcohol/week: 0.0 standard drinks    No Known Allergies  Current Outpatient Medications  Medication Sig Dispense Refill  . amLODipine (NORVASC) 10 MG tablet Take 10 mg by mouth 2 (two) times daily.     Marland Kitchen aspirin 81 MG tablet Take 81 mg by mouth daily. Reported on 11/01/2015    .  benazepril (LOTENSIN) 40 MG tablet Take 40 mg by mouth every evening.     . clopidogrel (PLAVIX) 75 MG tablet Take 1 tablet (75 mg total) by mouth daily. 90 tablet 3  . metoprolol (LOPRESSOR) 50 MG tablet Take 50 mg by mouth 2 (two) times daily.   5  . pravastatin (PRAVACHOL) 40 MG tablet Take 40 mg by mouth at bedtime.      No current facility-administered medications for this visit.    REVIEW OF SYSTEMS:  [X]  denotes positive finding, [ ]  denotes negative finding Cardiac  Comments:  Chest pain or chest pressure:    Shortness of breath upon exertion:    Short of breath when lying flat:    Irregular heart rhythm:        Vascular    Pain in calf, thigh, or hip brought on by ambulation:    Pain in feet at night that wakes you up from your sleep:     Blood clot in your veins:    Leg swelling:         Pulmonary    Oxygen at home:    Productive cough:     Wheezing:         Neurologic    Sudden weakness in arms or legs:     Sudden numbness in arms or legs:     Sudden onset of difficulty speaking or slurred speech:    Temporary loss of vision in one eye:     Problems with dizziness:         Gastrointestinal    Blood in stool:     Vomited blood:         Genitourinary    Burning when urinating:     Blood in urine:        Psychiatric    Major depression:         Hematologic    Bleeding problems:    Problems with blood clotting too easily:        Skin    Rashes or ulcers:        Constitutional    Fever or chills:      PHYSICAL EXAM: Vitals:   10/03/19 1015  BP: (!) 162/95  Pulse: (!) 54  Resp: 20  Temp: (!) 97.2 F (36.2 C)  TempSrc: Temporal  SpO2: 95%  Weight: 184 lb (83.5 kg)  Height: 6\' 2"  (1.88 m)    GENERAL: The patient is a well-nourished male, in no acute distress. The vital signs are documented above. CARDIAC: There is a regular rate and rhythm.  VASCULAR:  2+ palpable bilateral femoral pulses, pulse in fem fem graft Palpable DP pulse  bilaterally PULMONARY: There is good air exchange bilaterally without wheezing or rales. ABDOMEN: Soft and non-tender with normal pitched bowel sounds.  MUSCULOSKELETAL: There are no major deformities or cyanosis. NEUROLOGIC: No focal weakness or paresthesias are detected.  SKIN: There are no ulcers or rashes noted.   DATA:    His femorofemoral bypass remains patent on duplex today with biphasic waveforms and stable velocities over the last 6 months.  Continues to have evidence of stenosis of the left donor iliac artery but velocities are improved in the 260-290 range.  There is also evidence of some thrombus in the right anastomosis of his femorofemoral.  Assessment/Plan:  69 year old male that presents for interval surveillance after left iliac stenting with a left to right femoral-femoral bypass by Dr. Bridgett Larsson for right lower extremity claudication.  States his legs continue to feel fine with no recurrent claudication.  He has easily palpable pulse in the femorofemoral graft as well as both groins and palpable dorsalis pedis pulses on exam.  There is now some evidence of thrombus in  distal anastomosis and unclear if this is just chronic laminar thrombus.  The donor left iliac artery that was previously stented also continues to demonstrate evidence of stenosis (although velocities improved).  He has been very hesitant to undergo any additional intervention as we previously discussed in the past.  He is still smoking heavily which I discussed will be a big risk factor for failure of his femoral femoral bypass in the long term.  Ultimately I will plan to see him back in 3 months for continued close interval surveillance.  Marty Heck, MD [facility] [place] Office: [phone] Pager: [phone]

## 2019-10-04 ENCOUNTER — Other Ambulatory Visit: Payer: Self-pay | Admitting: *Deleted

## 2019-10-04 DIAGNOSIS — I739 Peripheral vascular disease, unspecified: Secondary | ICD-10-CM

## 2020-01-02 ENCOUNTER — Ambulatory Visit: Payer: Medicare Other | Admitting: Vascular Surgery

## 2020-01-02 ENCOUNTER — Encounter (HOSPITAL_COMMUNITY): Payer: Medicare Other

## 2020-01-09 ENCOUNTER — Other Ambulatory Visit: Payer: Self-pay

## 2020-01-09 ENCOUNTER — Ambulatory Visit (INDEPENDENT_AMBULATORY_CARE_PROVIDER_SITE_OTHER)
Admission: RE | Admit: 2020-01-09 | Discharge: 2020-01-09 | Disposition: A | Discharge status: Home / Self Care | Payer: Medicare Other | Source: Ambulatory Visit | Attending: Vascular Surgery | Admitting: Vascular Surgery

## 2020-01-09 ENCOUNTER — Encounter: Payer: Self-pay | Admitting: Vascular Surgery

## 2020-01-09 ENCOUNTER — Ambulatory Visit (INDEPENDENT_AMBULATORY_CARE_PROVIDER_SITE_OTHER): Payer: Medicare Other | Admitting: Vascular Surgery

## 2020-01-09 ENCOUNTER — Ambulatory Visit (HOSPITAL_COMMUNITY)
Admission: RE | Admit: 2020-01-09 | Discharge: 2020-01-09 | Disposition: A | Discharge status: Home / Self Care | Payer: Medicare Other | Source: Ambulatory Visit | Attending: Vascular Surgery | Admitting: Vascular Surgery

## 2020-01-09 VITALS — BP 144/79 | HR 51 | Temp 97.3°F | Resp 16 | Ht 74.0 in | Wt 183.0 lb

## 2020-01-09 DIAGNOSIS — I739 Peripheral vascular disease, unspecified: Secondary | ICD-10-CM

## 2020-01-09 DIAGNOSIS — I714 Abdominal aortic aneurysm, without rupture, unspecified: Secondary | ICD-10-CM | POA: Insufficient documentation

## 2020-01-09 DIAGNOSIS — I745 Embolism and thrombosis of iliac artery: Secondary | ICD-10-CM

## 2020-01-09 NOTE — Progress Notes (Signed)
Patient name: Jeffrey Stout MRN: [id number] DOB: 07/17/1951 Sex: male  REASON FOR VISIT: 44-month follow-up with surveillance  HPI: Jeffrey Stout is a 69 y.o. male with multiple medical problems that presents for follow-up at 86-month interval after previous left common and external iliac stenting and a left to right femoral-femoral bypass for right lower extremity claudication by Dr. Bridgett Larsson.  In review of the notes patient had left common iliac stenting by Dr. Bridgett Larsson on 02/15/2014.  Ultimately at a later date he underwent a left to right femoral-femoral bypass 04/11/2014.  Then he had a drug-coated balloon angioplasty of the left common iliac stent and extension of the stent into the external iliac artery on 11/12/2016.  All this was performed Dr. Bridgett Larsson.    On today's follow-up states his legs are doing pretty well.  Not having any claudication symptoms or rest pain or tissue loss.  He is still smoking.  We had previously discussed intervention on the left iliac stents given increasing velocity but he wanted to delay any surgical procedures at last visit.  Past Medical History:  Diagnosis Date  . AAA (abdominal aortic aneurysm) ([place]) 02/03/2018   3cm fusiform, noted on CT abd   . Adrenal myelolipoma 03/07/2018   Small, right 1.9x1.5cm, noted on MRI ABD  . Aortic atherosclerosis ([place]) 03/07/2018   Severe, noted on MRI ABD  . Cyst of left kidney 03/07/2018   Multiple, noted on MRI ABD  . Diverticulosis 02/03/2018   descending ang sigmoid, noted on CT abd   . GERD (gastroesophageal reflux disease)   . Grade I diastolic dysfunction [id number]   Noted on ECHO   . H/O renal artery stenosis   . Hyperlipidemia   . Hypertension   . Intermittent claudication ([place])   . LVH (left ventricular hypertrophy) 04/04/2014   Mild, noted on ECHO  . Peripheral arterial disease ([place])   . Renal artery stenosis ([place])   . Right renal mass 03/07/2018   2.7 cm, noted on MRI ABD  . Thrombus of aorta ([place]) 02/03/2018     progressive mural, noted on CT abd   . TIA (transient ischemic attack) 04/2013   went to [facility] for evaluation    Past Surgical History:  Procedure Laterality Date  . ABDOMINAL AORTAGRAM N/A 02/15/2014   Procedure: ABDOMINAL Maxcine Ham;  Surgeon: Conrad [place], MD;  Location: [facility] CATH LAB;  Service: Cardiovascular;  Laterality: N/A;  . ABDOMINAL AORTOGRAM W/LOWER EXTREMITY N/A 11/12/2016   Procedure: Abdominal Aortogram w/Lower Extremity;  Surgeon: Conrad H. Rivera Colon, MD;  Location: [place] CV LAB;  Service: Cardiovascular;  Laterality: N/A;  . CHOLECYSTECTOMY    . COLONOSCOPY    . FEMORAL-FEMORAL BYPASS GRAFT Bilateral 04/11/2014   Procedure: BYPASS GRAFT LEFT-RIGHT FEMORAL-FEMORAL ARTERY and bilateral femoral endarterectomies with Bovine Patch Angioplasties.;  Surgeon: Conrad [place]anna, MD;  Location: [place];  Service: Vascular;  Laterality: Bilateral;  . LAPAROSCOPIC NEPHRECTOMY Right 05/26/2018   Procedure: LAPAROSCOPIC RADICAL NEPHRECTOMY;  Surgeon: Raynelle Bring, MD;  [facility];  Service: Urology;  Laterality: Right;  . PERIPHERAL VASCULAR INTERVENTION  11/12/2016   Procedure: Peripheral Vascular Intervention;  Surgeon: Conrad [place], MD;  Location: [place] CV LAB;  Service: Cardiovascular;;  . RENAL ARTERY STENT Left 2015  . VASCULAR SURGERY      Family History  Problem Relation Age of Onset  . Diabetes Mother   . Hypertension Brother   . Heart attack Brother     SOCIAL HISTORY: Social History  Tobacco Use  . Smoking status: Current Every Day Smoker    Packs/day: 1.50    Years: 53.00    Pack years: 79.50    Types: Cigarettes  . Smokeless tobacco: Never Used  Substance Use Topics  . Alcohol use: No    Alcohol/week: 0.0 standard drinks    No Known Allergies  Current Outpatient Medications  Medication Sig Dispense Refill  . amLODipine (NORVASC) 10 MG tablet Take 10 mg by mouth daily.     Marland Kitchen aspirin 81 MG tablet Take 81 mg by mouth daily. Reported on 11/01/2015     . benazepril (LOTENSIN) 40 MG tablet Take 40 mg by mouth every evening.     . clopidogrel (PLAVIX) 75 MG tablet Take 1 tablet (75 mg total) by mouth daily. 90 tablet 3  . metoprolol (LOPRESSOR) 50 MG tablet Take 50 mg by mouth 2 (two) times daily.   5  . pravastatin (PRAVACHOL) 40 MG tablet Take 40 mg by mouth at bedtime.      No current facility-administered medications for this visit.    REVIEW OF SYSTEMS:  [X]  denotes positive finding, [ ]  denotes negative finding Cardiac  Comments:  Chest pain or chest pressure:    Shortness of breath upon exertion:    Short of breath when lying flat:    Irregular heart rhythm:        Vascular    Pain in calf, thigh, or hip brought on by ambulation:    Pain in feet at night that wakes you up from your sleep:     Blood clot in your veins:    Leg swelling:         Pulmonary    Oxygen at home:    Productive cough:     Wheezing:         Neurologic    Sudden weakness in arms or legs:     Sudden numbness in arms or legs:     Sudden onset of difficulty speaking or slurred speech:    Temporary loss of vision in one eye:     Problems with dizziness:         Gastrointestinal    Blood in stool:     Vomited blood:         Genitourinary    Burning when urinating:     Blood in urine:        Psychiatric    Major depression:         Hematologic    Bleeding problems:    Problems with blood clotting too easily:        Skin    Rashes or ulcers:        Constitutional    Fever or chills:      PHYSICAL EXAM: Vitals:   01/09/20 0931  BP: (!) 144/79  Pulse: (!) 51  Resp: 16  Temp: (!) 97.3 F (36.3 C)  TempSrc: Temporal  SpO2: 96%  Weight: 183 lb (83 kg)  Height: 6\' 2"  (1.88 m)    GENERAL: The patient is a well-nourished male, in no acute distress. The vital signs are documented above. CARDIAC: There is a regular rate and rhythm.  VASCULAR:  2+ palpable bilateral femoral pulses, pulse in fem fem graft Palpable DP pulse  bilaterally No lower extremity tissue loss PULMONARY: There is good air exchange bilaterally without wheezing or rales. ABDOMEN: Soft and non-tender with normal pitched bowel sounds.  MUSCULOSKELETAL: There are no major deformities or cyanosis. NEUROLOGIC: No  focal weakness or paresthesias are detected. SKIN: There are no ulcers or rashes noted.   DATA:    Femoral-femoral bypass remains patent based on duplex and the previous thrombus now appears proximal to the anastomosis in the right groin.  He does continue to have increasing velocities in the donor artery in the left iliac now with velocities over 300.  ABIs remain preserved at 0.94 on the right and 0.89 on the left.  Abdominal aortic aneurysm essentially stable at 3.3 cm.  Assessment/Plan:  69 year old male that presents for interval surveillance after left iliac stenting with a left to right femoral-femoral bypass by Dr. Bridgett Larsson for right lower extremity claudication.  We have been watching an increasing velocity in the left iliac stent which is the donor artery for his femoral-femoral bypass.  Velocities are now over 300 and we discussed the option of proceeding back for at least arteriogram and possible intervention whether with a drug-coated balloon or new stent or any other appropriate intervention.  He is now amendable to proceed.  We will get scheduled at his availability.  Risk benefits discussed.  Discussed that his abdominal aortic aneurysm stable at 3.3 cm and no indication for intervention.  Will need yearly surveillance and really could be imaged every 2 to 3 years.  Jeffrey Heck, MD [facility] [place] Office: [place]

## 2020-01-16 DIAGNOSIS — C641 Malignant neoplasm of right kidney, except renal pelvis: Secondary | ICD-10-CM | POA: Diagnosis not present

## 2020-01-17 ENCOUNTER — Other Ambulatory Visit (HOSPITAL_COMMUNITY)
Admission: RE | Admit: 2020-01-17 | Discharge: 2020-01-17 | Disposition: A | Discharge status: Home / Self Care | Payer: Medicare Other | Source: Ambulatory Visit | Attending: Vascular Surgery | Admitting: Vascular Surgery

## 2020-01-17 DIAGNOSIS — Z20822 Contact with and (suspected) exposure to covid-19: Secondary | ICD-10-CM | POA: Diagnosis not present

## 2020-01-17 DIAGNOSIS — Z01812 Encounter for preprocedural laboratory examination: Secondary | ICD-10-CM | POA: Diagnosis not present

## 2020-01-17 LAB — SARS CORONAVIRUS 2 (TAT 6-24 HRS): SARS Coronavirus 2: NEGATIVE

## 2020-01-18 ENCOUNTER — Other Ambulatory Visit: Payer: Self-pay

## 2020-01-18 ENCOUNTER — Encounter (HOSPITAL_COMMUNITY)
Admission: RE | Disposition: A | Discharge status: Home / Self Care | Payer: Self-pay | Source: Home / Self Care | Attending: Vascular Surgery

## 2020-01-18 ENCOUNTER — Ambulatory Visit (HOSPITAL_COMMUNITY)
Admission: RE | Admit: 2020-01-18 | Discharge: 2020-01-18 | Disposition: A | Discharge status: Home / Self Care | Payer: Medicare Other | Attending: Vascular Surgery | Admitting: Vascular Surgery

## 2020-01-18 DIAGNOSIS — Z8673 Personal history of transient ischemic attack (TIA), and cerebral infarction without residual deficits: Secondary | ICD-10-CM | POA: Insufficient documentation

## 2020-01-18 DIAGNOSIS — F17210 Nicotine dependence, cigarettes, uncomplicated: Secondary | ICD-10-CM | POA: Diagnosis not present

## 2020-01-18 DIAGNOSIS — Z79899 Other long term (current) drug therapy: Secondary | ICD-10-CM | POA: Diagnosis not present

## 2020-01-18 DIAGNOSIS — K219 Gastro-esophageal reflux disease without esophagitis: Secondary | ICD-10-CM | POA: Insufficient documentation

## 2020-01-18 DIAGNOSIS — I700 Atherosclerosis of aorta: Secondary | ICD-10-CM | POA: Diagnosis not present

## 2020-01-18 DIAGNOSIS — I739 Peripheral vascular disease, unspecified: Secondary | ICD-10-CM | POA: Insufficient documentation

## 2020-01-18 DIAGNOSIS — Z905 Acquired absence of kidney: Secondary | ICD-10-CM | POA: Insufficient documentation

## 2020-01-18 DIAGNOSIS — E785 Hyperlipidemia, unspecified: Secondary | ICD-10-CM | POA: Insufficient documentation

## 2020-01-18 DIAGNOSIS — I714 Abdominal aortic aneurysm, without rupture: Secondary | ICD-10-CM | POA: Insufficient documentation

## 2020-01-18 DIAGNOSIS — Y832 Surgical operation with anastomosis, bypass or graft as the cause of abnormal reaction of the patient, or of later complication, without mention of misadventure at the time of the procedure: Secondary | ICD-10-CM | POA: Diagnosis not present

## 2020-01-18 DIAGNOSIS — I119 Hypertensive heart disease without heart failure: Secondary | ICD-10-CM | POA: Insufficient documentation

## 2020-01-18 DIAGNOSIS — I708 Atherosclerosis of other arteries: Secondary | ICD-10-CM | POA: Insufficient documentation

## 2020-01-18 DIAGNOSIS — Z95828 Presence of other vascular implants and grafts: Secondary | ICD-10-CM | POA: Insufficient documentation

## 2020-01-18 DIAGNOSIS — Z7902 Long term (current) use of antithrombotics/antiplatelets: Secondary | ICD-10-CM | POA: Insufficient documentation

## 2020-01-18 DIAGNOSIS — I771 Stricture of artery: Secondary | ICD-10-CM | POA: Diagnosis not present

## 2020-01-18 DIAGNOSIS — Z8249 Family history of ischemic heart disease and other diseases of the circulatory system: Secondary | ICD-10-CM | POA: Insufficient documentation

## 2020-01-18 DIAGNOSIS — T82858A Stenosis of vascular prosthetic devices, implants and grafts, initial encounter: Secondary | ICD-10-CM | POA: Insufficient documentation

## 2020-01-18 DIAGNOSIS — Z7982 Long term (current) use of aspirin: Secondary | ICD-10-CM | POA: Diagnosis not present

## 2020-01-18 HISTORY — PX: ABDOMINAL AORTOGRAM: CATH118222

## 2020-01-18 HISTORY — PX: PERIPHERAL VASCULAR BALLOON ANGIOPLASTY: CATH118281

## 2020-01-18 LAB — POCT I-STAT, CHEM 8
BUN: 17 mg/dL (ref 8–23)
Calcium, Ion: 1.3 mmol/L (ref 1.15–1.40)
Chloride: 104 mmol/L (ref 98–111)
Creatinine, Ser: 1.4 mg/dL — ABNORMAL HIGH (ref 0.61–1.24)
Glucose, Bld: 125 mg/dL — ABNORMAL HIGH (ref 70–99)
HCT: 50 % (ref 39.0–52.0)
Hemoglobin: 17 g/dL (ref 13.0–17.0)
Potassium: 4 mmol/L (ref 3.5–5.1)
Sodium: 140 mmol/L (ref 135–145)
TCO2: 26 mmol/L (ref 22–32)

## 2020-01-18 SURGERY — ABDOMINAL AORTOGRAM
Anesthesia: LOCAL

## 2020-01-18 MED ORDER — ONDANSETRON HCL 4 MG/2ML IJ SOLN: 4.0000 mg | Freq: Four times a day (QID) | INTRAMUSCULAR | Status: DC | PRN

## 2020-01-18 MED ORDER — HEPARIN SODIUM (PORCINE) 1000 UNIT/ML IJ SOLN
INTRAMUSCULAR | Status: DC | PRN
  Administered 2020-01-18: 8000 [IU] via INTRAVENOUS

## 2020-01-18 MED ORDER — SODIUM CHLORIDE 0.9 % IV SOLN: INTRAVENOUS | Status: DC

## 2020-01-18 MED ORDER — LABETALOL HCL 5 MG/ML IV SOLN: 10.0000 mg | INTRAVENOUS | Status: DC | PRN

## 2020-01-18 MED ORDER — HYDRALAZINE HCL 20 MG/ML IJ SOLN: 5.0000 mg | INTRAMUSCULAR | Status: DC | PRN

## 2020-01-18 MED ORDER — ACETAMINOPHEN 325 MG PO TABS: 650.0000 mg | ORAL_TABLET | ORAL | Status: DC | PRN

## 2020-01-18 MED ORDER — HYDRALAZINE HCL 20 MG/ML IJ SOLN
INTRAMUSCULAR | Status: AC
  Filled 2020-01-18: qty 1

## 2020-01-18 MED ORDER — HEPARIN (PORCINE) IN NACL 1000-0.9 UT/500ML-% IV SOLN
INTRAVENOUS | Status: AC
  Filled 2020-01-18: qty 1000

## 2020-01-18 MED ORDER — IODIXANOL 320 MG/ML IV SOLN
INTRAVENOUS | Status: DC | PRN
  Administered 2020-01-18: 55 mL via INTRA_ARTERIAL

## 2020-01-18 MED ORDER — HEPARIN SODIUM (PORCINE) 1000 UNIT/ML IJ SOLN
INTRAMUSCULAR | Status: AC
  Filled 2020-01-18: qty 1

## 2020-01-18 MED ORDER — LIDOCAINE HCL (PF) 1 % IJ SOLN
INTRAMUSCULAR | Status: AC
  Filled 2020-01-18: qty 30

## 2020-01-18 MED ORDER — HEPARIN (PORCINE) IN NACL 1000-0.9 UT/500ML-% IV SOLN
INTRAVENOUS | Status: DC | PRN
  Administered 2020-01-18 (×2): 500 mL

## 2020-01-18 MED ORDER — SODIUM CHLORIDE 0.9 % IV SOLN: 250.0000 mL | INTRAVENOUS | Status: DC | PRN

## 2020-01-18 MED ORDER — SODIUM CHLORIDE 0.9 % WEIGHT BASED INFUSION: 1.0000 mL/kg/h | INTRAVENOUS | Status: DC

## 2020-01-18 MED ORDER — LIDOCAINE HCL (PF) 1 % IJ SOLN
INTRAMUSCULAR | Status: DC | PRN
  Administered 2020-01-18: 15 mL

## 2020-01-18 MED ORDER — SODIUM CHLORIDE 0.9% FLUSH: 3.0000 mL | Freq: Two times a day (BID) | INTRAVENOUS | Status: DC

## 2020-01-18 MED ORDER — HYDRALAZINE HCL 20 MG/ML IJ SOLN
INTRAMUSCULAR | Status: DC | PRN
  Administered 2020-01-18: 10 mg via INTRAVENOUS

## 2020-01-18 MED ORDER — SODIUM CHLORIDE 0.9% FLUSH: 3.0000 mL | INTRAVENOUS | Status: DC | PRN

## 2020-01-18 SURGICAL SUPPLY — 15 items
BALLN LUTONIX AV 8X40X75 (BALLOONS) ×3
BALLOON LUTONIX AV 8X40X75 (BALLOONS) ×2 IMPLANT
CATH OMNI FLUSH 5F 65CM (CATHETERS) ×3 IMPLANT
CLOSURE MYNX CONTROL 6F/7F (Vascular Products) ×3 IMPLANT
KIT ENCORE 26 ADVANTAGE (KITS) ×3 IMPLANT
KIT MICROPUNCTURE NIT STIFF (SHEATH) ×3 IMPLANT
KIT PV (KITS) ×3 IMPLANT
SHEATH BRITE TIP 7FR 35CM (SHEATH) ×3 IMPLANT
SHEATH PINNACLE 5F 10CM (SHEATH) ×3 IMPLANT
SHEATH PINNACLE 7F 10CM (SHEATH) ×3 IMPLANT
SHEATH PROBE COVER 6X72 (BAG) ×3 IMPLANT
SYR MEDRAD MARK V 150ML (SYRINGE) ×3 IMPLANT
TRANSDUCER W/STOPCOCK (MISCELLANEOUS) ×3 IMPLANT
TRAY PV CATH (CUSTOM PROCEDURE TRAY) ×3 IMPLANT
WIRE BENTSON .035X145CM (WIRE) ×3 IMPLANT

## 2020-01-18 NOTE — Discharge Instructions (Signed)
Femoral Site Care This sheet gives you information about how to care for yourself after your procedure. Your health care provider may also give you more specific instructions. If you have problems or questions, contact your health care provider. What can I expect after the procedure? After the procedure, it is common to have:  Bruising that usually fades within 1-2 weeks.  Tenderness at the site. Follow these instructions at home: Wound care  Follow instructions from your health care provider about how to take care of your insertion site. Make sure you: ? Wash your hands with soap and water before you change your bandage (dressing). If soap and water are not available, use hand sanitizer. ? Change your dressing as told by your health care provider. ? Leave stitches (sutures), skin glue, or adhesive strips in place. These skin closures may need to stay in place for 2 weeks or longer. If adhesive strip edges start to loosen and curl up, you may trim the loose edges. Do not remove adhesive strips completely unless your health care provider tells you to do that.  Do not take baths, swim, or use a hot tub until your health care provider approves.  You may shower 24-48 hours after the procedure or as told by your health care provider. ? Gently wash the site with plain soap and water. ? Pat the area dry with a clean towel. ? Do not rub the site. This may cause bleeding.  Do not apply powder or lotion to the site. Keep the site clean and dry.  Check your femoral site every day for signs of infection. Check for: ? Redness, swelling, or pain. ? Fluid or blood. ? Warmth. ? Pus or a bad smell. Activity  For the first 2-3 days after your procedure, or as long as directed: ? Avoid climbing stairs as much as possible. ? Do not squat.  Do not lift anything that is heavier than 10 lb (4.5 kg), or the limit that you are told, until your health care provider says that it is safe.  Rest as  directed. ? Avoid sitting for a long time without moving. Get up to take short walks every 1-2 hours.  Do not drive for 24 hours if you were given a medicine to help you relax (sedative). General instructions  Take over-the-counter and prescription medicines only as told by your health care provider.  Keep all follow-up visits as told by your health care provider. This is important. Contact a health care provider if you have:  A fever or chills.  You have redness, swelling, or pain around your insertion site. Get help right away if:  The catheter insertion area swells very fast.  You pass out.  You suddenly start to sweat or your skin gets clammy.  The catheter insertion area is bleeding, and the bleeding does not stop when you hold steady pressure on the area.  The area near or just beyond the catheter insertion site becomes pale, cool, tingly, or numb. These symptoms may represent a serious problem that is an emergency. Do not wait to see if the symptoms will go away. Get medical help right away. Call your local emergency services (911 in the [place] Do not drive yourself to the hospital. Summary  After the procedure, it is common to have bruising that usually fades within 1-2 weeks.  Check your femoral site every day for signs of infection.  Do not lift anything that is heavier than 10 lb (4.5 kg), or the   limit that you are told, until your health care provider says that it is safe. This information is not intended to replace advice given to you by your health care provider. Make sure you discuss any questions you have with your health care provider. Document Revised: 08/23/2017 Document Reviewed: 08/23/2017 Elsevier Patient Education  [place]. Continue aspirin and plavix.  Will arrange follow-up in one month in our office.  Call with questions or concerns.

## 2020-01-18 NOTE — H&P (Signed)
History and Physical Interval Note:  01/18/2020 7:20 AM  Jeffrey Stout  has presented today for surgery, with the diagnosis of Peripheral Artery Disease.  The various methods of treatment have been discussed with the patient and family. After consideration of risks, benefits and other options for treatment, the patient has consented to  Procedure(s): ABDOMINAL AORTOGRAM W/Bilateral LOWER EXTREMITY Runoff (N/A) as a surgical intervention.  The patient's history has been reviewed, patient examined, no change in status, stable for surgery.  I have reviewed the patient's chart and labs.  Questions were answered to the patient's satisfaction.    Aortogram, lower extremity arteriogram.  Concern for iliac stenosis which is inflow for fem fem.    Marty Heck  Patient name: Jeffrey Stout MRN: [id number] DOB: 04-Oct-1950 Sex: male  REASON FOR VISIT: 69-month follow-up with surveillance  HPI:  Jeffrey Stout is a 69 y.o. male with multiple medical problems that presents for follow-up at 35-month interval after previous left common and external iliac stenting and a left to right femoral-femoral bypass for right lower extremity claudication by Dr. Bridgett Larsson. In review of the notes patient had left common iliac stenting by Dr. Bridgett Larsson on 02/15/2014. Ultimately at a later date he underwent a left to right femoral-femoral bypass 04/11/2014. Then he had a drug-coated balloon angioplasty of the left common iliac stent and extension of the stent into the external iliac artery on 11/12/2016. All this was performed Dr. Bridgett Larsson.  On today's follow-up states his legs are doing pretty well. Not having any claudication symptoms or rest pain or tissue loss. He is still smoking. We had previously discussed intervention on the left iliac stents given increasing velocity but he wanted to delay any surgical procedures at last visit.      Past Medical History:  Diagnosis Date  . AAA (abdominal aortic aneurysm) (Jeffrey Stout) 02/03/2018   3cm  fusiform, noted on CT abd   . Adrenal myelolipoma 03/07/2018   Small, right 1.9x1.5cm, noted on MRI ABD  . Aortic atherosclerosis ([place]) 03/07/2018   Severe, noted on MRI ABD  . Cyst of left kidney 03/07/2018   Multiple, noted on MRI ABD  . Diverticulosis 02/03/2018   descending ang sigmoid, noted on CT abd   . GERD (gastroesophageal reflux disease)   . Grade I diastolic dysfunction [id number]   Noted on ECHO   . H/O renal artery stenosis   . Hyperlipidemia   . Hypertension   . Intermittent claudication (Jeffrey Stout)   . LVH (left ventricular hypertrophy) 04/04/2014   Mild, noted on ECHO  . Peripheral arterial disease ([place])   . Renal artery stenosis (Jeffrey Stout)   . Right renal mass 03/07/2018   2.7 cm, noted on MRI ABD  . Thrombus of aorta (Jeffrey Stout) 02/03/2018   progressive mural, noted on CT abd   . TIA (transient ischemic attack) 04/2013   went to [facility] for evaluation        Past Surgical History:  Procedure Laterality Date  . ABDOMINAL AORTAGRAM N/A 02/15/2014   Procedure: ABDOMINAL Maxcine Ham; Surgeon: Conrad [place], MD; Location: [facility] CATH LAB; Service: Cardiovascular; Laterality: N/A;  . ABDOMINAL AORTOGRAM W/LOWER EXTREMITY N/A 11/12/2016   Procedure: Abdominal Aortogram w/Lower Extremity; Surgeon: Conrad Athol, MD; Location: [place] CV LAB; Service: Cardiovascular; Laterality: N/A;  . CHOLECYSTECTOMY    . COLONOSCOPY    . FEMORAL-FEMORAL BYPASS GRAFT Bilateral 04/11/2014   Procedure: BYPASS GRAFT LEFT-RIGHT FEMORAL-FEMORAL ARTERY and bilateral femoral endarterectomies with Bovine Patch Angioplasties.; Surgeon: Conrad Aubrey, MD;  Location: [facility] OR; Service: Vascular; Laterality: Bilateral;  . LAPAROSCOPIC NEPHRECTOMY Right 05/26/2018   Procedure: LAPAROSCOPIC RADICAL NEPHRECTOMY; Surgeon: Jeffrey Bring, MD; Location: [facility] ORS; Service: Urology; Laterality: Right;  . PERIPHERAL VASCULAR INTERVENTION  11/12/2016   Procedure: Peripheral Vascular Intervention; Surgeon: Conrad [place]burg, MD;  Location: [place] CV LAB; Service: Cardiovascular;;  . RENAL ARTERY STENT Left 2015  . VASCULAR SURGERY          Family History  Problem Relation Age of Onset  . Diabetes Mother   . Hypertension Brother   . Heart attack Brother    SOCIAL HISTORY:  Social History        Tobacco Use  . Smoking status: Current Every Day Smoker    Packs/day: 1.50    Years: 53.00    Pack years: 79.50    Types: Cigarettes  . Smokeless tobacco: Never Used  Substance Use Topics  . Alcohol use: No    Alcohol/week: 0.0 standard drinks   No Known Allergies        Current Outpatient Medications  Medication Sig Dispense Refill  . amLODipine (NORVASC) 10 MG tablet Take 10 mg by mouth daily.     Marland Kitchen aspirin 81 MG tablet Take 81 mg by mouth daily. Reported on 11/01/2015    . benazepril (LOTENSIN) 40 MG tablet Take 40 mg by mouth every evening.     . clopidogrel (PLAVIX) 75 MG tablet Take 1 tablet (75 mg total) by mouth daily. 90 tablet 3  . metoprolol (LOPRESSOR) 50 MG tablet Take 50 mg by mouth 2 (two) times daily.   5  . pravastatin (PRAVACHOL) 40 MG tablet Take 40 mg by mouth at bedtime.      No current facility-administered medications for this visit.   REVIEW OF SYSTEMS:  [X]  denotes positive finding, [ ]  denotes negative finding  Cardiac  Comments:  Chest pain or chest pressure:    Shortness of breath upon exertion:    Short of breath when lying flat:    Irregular heart rhythm:        Vascular    Pain in calf, thigh, or hip brought on by ambulation:    Pain in feet at night that wakes you up from your sleep:     Blood clot in your veins:    Leg swelling:         Pulmonary    Oxygen at home:    Productive cough:     Wheezing:         Neurologic    Sudden weakness in arms or legs:     Sudden numbness in arms or legs:     Sudden onset of difficulty speaking or slurred speech:    Temporary loss of vision in one eye:     Problems with dizziness:         Gastrointestinal    Blood in  stool:     Vomited blood:         Genitourinary    Burning when urinating:     Blood in urine:        Psychiatric    Major depression:         Hematologic    Bleeding problems:    Problems with blood clotting too easily:        Skin    Rashes or ulcers:        Constitutional    Fever or chills:    PHYSICAL EXAM:     Vitals:  01/09/20 0931  BP: (!) 144/79  Pulse: (!) 51  Resp: 16  Temp: (!) 97.3 F (36.3 C)  TempSrc: Temporal  SpO2: 96%  Weight: 183 lb (83 kg)  Height: 6\' 2"  (1.88 m)   GENERAL: The patient is a well-nourished male, in no acute distress. The vital signs are documented above.  CARDIAC: There is a regular rate and rhythm.  VASCULAR:  2+ palpable bilateral femoral pulses, pulse in fem fem graft  Palpable DP pulse bilaterally  No lower extremity tissue loss  PULMONARY: There is good air exchange bilaterally without wheezing or rales.  ABDOMEN: Soft and non-tender with normal pitched bowel sounds.  MUSCULOSKELETAL: There are no major deformities or cyanosis.  NEUROLOGIC: No focal weakness or paresthesias are detected.  SKIN: There are no ulcers or rashes noted.  DATA:  Femoral-femoral bypass remains patent based on duplex and the previous thrombus now appears proximal to the anastomosis in the right groin. He does continue to have increasing velocities in the donor artery in the left iliac now with velocities over 300. ABIs remain preserved at 0.94 on the right and 0.89 on the left.  Abdominal aortic aneurysm essentially stable at 3.3 cm.  Assessment/Plan:  69 year old male that presents for interval surveillance after left iliac stenting with a left to right femoral-femoral bypass by Dr. Bridgett Larsson for right lower extremity claudication. We have been watching an increasing velocity in the left iliac stent which is the donor artery for his femoral-femoral bypass. Velocities are now over 300 and we discussed the option of proceeding back for at least arteriogram  and possible intervention whether with a drug-coated balloon or new stent or any other appropriate intervention. He is now amendable to proceed. We will get scheduled at his availability. Risk benefits discussed. Discussed that his abdominal aortic aneurysm stable at 3.3 cm and no indication for intervention. Will need yearly surveillance and really could be imaged every 2 to 3 years.  Marty Heck, MD  [facility]  Office: [phone]

## 2020-01-18 NOTE — Progress Notes (Signed)
Pt states he drank 1/2 cup of black coffee this am around 0400. Eliberto IvoryNori Riis , Rn at bedside and aware

## 2020-01-18 NOTE — Op Note (Signed)
Patient name: Jeffrey Stout MRN: [id number] DOB: 03-Feb-1951 Sex: male  01/18/2020 Pre-operative Diagnosis: Evidence of left iliac stenosis on surveillance in setting of previous left common and external iliac stenting with left to right femoral-femoral bypass for right lower extremity claudication Post-operative diagnosis:  Same Surgeon:  Marty Heck, MD Procedure Performed: 1.  Ultrasound-guided access of left common femoral artery above femorofemoral anastomosis 2.  Aortogram including catheter selection of the aorta and left iliac arteriogram and evaluation of the femoral-femoral bypass 3.  Left external iliac artery angioplasty with drug-coated balloon for in-stent restenosis (8 mm x 40 mm drug-coated Lutonix) 4.  Mynx closure of the left common femoral artery  Indications: Patient is a 69 year old male who previously had multiple interventions with Dr. Bridgett Larsson in the past including a left common iliac artery stent with a 10 mm I-cast as well as a left external iliac artery stent and a left to right femoral-femoral bypass.  On recent surveillance was noted to have increasing velocities in the left iliac artery stent with concern for this becoming a threat to his femoral-femoral bypass.  He presents today for planned arteriogram and possible intervention after risk benefits discussed.  Findings:   Aortogram showed only a single left renal artery with a widely patent left renal artery stent.  His infrarenal aorta is very calcified and diseased and he has greater than 50% stenosis in the infrarenal segment.  Pullback pressures here did not show any evidence of a pressure gradient.  The right iliac artery is occluded as previously noted.  The left common iliac artery stent is widely patent.  He had a greater than 50 to 60% stenosis in the mid segment of the left external iliac artery stent just across the hypogastric.  The femorofemoral bypass is widely patent and he has evidence of SFA and  profunda runoff in both groins with no evidence of stenosis in either groin.  Ultimately the left external iliac artery in-stent restenosis was angioplastied with a 8 mm drug-coated balloon with good results and no evidence of residual stenosis.    Procedure:  The patient was identified in the holding area and taken to room 8.  The patient was then placed supine on the table and prepped and draped in the usual sterile fashion.  A time out was called.  Ultrasound was used to evaluate the left common femoral artery.  It was patent .  A digital ultrasound image was acquired.  A micropuncture needle was used to access the left common femoral artery under ultrasound guidance.  An 018 wire was advanced without resistance and a micropuncture sheath was placed.  The 018 wire was removed and a benson wire was placed.  The micropuncture sheath was exchanged for a 5 french sheath.  An omniflush catheter was advanced over the wire to the level of L-1.  An abdominal angiogram was obtained.  Pertinent findings are noted above.  We then pulled the catheter down and got dedicated left iliac arteriogram and evaluated the femorofemoral bypass.  Ultimately made the decision to intervene on the left external iliac artery in-stent restenosis.  We then upsized to 7 French bright tip sheath over the Bentson wire in the left groin.  Patient was given 100 units per kilogram heparin.  We then selected an 8 mm x 40 mm drug-coated balloon.  This was then deployed to nominal pressure for 3 minutes across the external iliac in-stent restenosis.  I then obtained a retrograde injection that showed no  evidence of residual stenosis and no dissection.  That point time we exchanged for a short 7 [place] sheath.  Mynx closure was deployed in left groin and manual pressure was held for 5 minutes.  He had excellent femoral pulses and a pulse in his femorofemoral bypass at completion was taken to PACU in stable condition.   Plan: Patient will  continue his aspirin and Plavix which he is already taking.  I will arrange follow-up in 1 month with iliac duplex and ABIs.  Marty Heck, MD [facility] Office: [phone]

## 2020-01-23 DIAGNOSIS — C641 Malignant neoplasm of right kidney, except renal pelvis: Secondary | ICD-10-CM | POA: Diagnosis not present

## 2020-01-23 DIAGNOSIS — R918 Other nonspecific abnormal finding of lung field: Secondary | ICD-10-CM | POA: Diagnosis not present

## 2020-02-02 DIAGNOSIS — C641 Malignant neoplasm of right kidney, except renal pelvis: Secondary | ICD-10-CM | POA: Diagnosis not present

## 2020-02-07 ENCOUNTER — Encounter (HOSPITAL_COMMUNITY): Payer: Self-pay | Admitting: Vascular Surgery

## 2020-02-12 ENCOUNTER — Other Ambulatory Visit: Payer: Self-pay | Admitting: *Deleted

## 2020-02-12 DIAGNOSIS — I745 Embolism and thrombosis of iliac artery: Secondary | ICD-10-CM

## 2020-02-12 DIAGNOSIS — I739 Peripheral vascular disease, unspecified: Secondary | ICD-10-CM

## 2020-02-21 ENCOUNTER — Other Ambulatory Visit (HOSPITAL_COMMUNITY): Payer: Self-pay | Admitting: Surgery

## 2020-02-22 ENCOUNTER — Other Ambulatory Visit: Payer: Self-pay | Admitting: Vascular Surgery

## 2020-02-22 ENCOUNTER — Other Ambulatory Visit: Payer: Self-pay

## 2020-02-22 ENCOUNTER — Ambulatory Visit (INDEPENDENT_AMBULATORY_CARE_PROVIDER_SITE_OTHER)
Admission: RE | Admit: 2020-02-22 | Discharge: 2020-02-22 | Disposition: A | Discharge status: Home / Self Care | Payer: Medicare Other | Source: Ambulatory Visit | Attending: Vascular Surgery | Admitting: Vascular Surgery

## 2020-02-22 ENCOUNTER — Ambulatory Visit (HOSPITAL_COMMUNITY)
Admission: RE | Admit: 2020-02-22 | Discharge: 2020-02-22 | Disposition: A | Discharge status: Home / Self Care | Payer: Medicare Other | Source: Ambulatory Visit | Attending: Vascular Surgery | Admitting: Vascular Surgery

## 2020-02-22 DIAGNOSIS — I739 Peripheral vascular disease, unspecified: Secondary | ICD-10-CM

## 2020-02-22 DIAGNOSIS — I745 Embolism and thrombosis of iliac artery: Secondary | ICD-10-CM

## 2020-02-27 ENCOUNTER — Other Ambulatory Visit: Payer: Self-pay

## 2020-02-27 ENCOUNTER — Ambulatory Visit (INDEPENDENT_AMBULATORY_CARE_PROVIDER_SITE_OTHER): Payer: Medicare Other | Admitting: Physician Assistant

## 2020-02-27 ENCOUNTER — Encounter: Payer: Self-pay | Admitting: Physician Assistant

## 2020-02-27 VITALS — BP 135/79 | HR 53 | Temp 97.6°F | Resp 20 | Ht 74.0 in | Wt 184.3 lb

## 2020-02-27 DIAGNOSIS — I739 Peripheral vascular disease, unspecified: Secondary | ICD-10-CM | POA: Diagnosis not present

## 2020-02-27 DIAGNOSIS — I745 Embolism and thrombosis of iliac artery: Secondary | ICD-10-CM | POA: Diagnosis not present

## 2020-02-27 MED ORDER — CLOPIDOGREL BISULFATE 75 MG PO TABS: 75.0000 mg | ORAL_TABLET | Freq: Every day | ORAL | 3 refills | Status: DC

## 2020-02-27 NOTE — Progress Notes (Signed)
Office Note     CC:  follow up Requesting Provider:  Jaynee Eagles, MD  HPI: Jeffrey Stout is a 69 y.o. (07-Nov-1950) male who presents for follow up of his peripheral vascular disease. He recently underwent a Angiogram by Dr. Carlis Abbott on 01/18/20. He had ultrasound-guided access of left common femoral artery above femorofemoral anastomosis. Aortogram including catheter selection of the aorta and left iliac arteriogram and evaluation of the femoral-femoral bypass. Left external iliac artery angioplasty with drug-coated balloon for in-stent restenosis (8 mm x 40 mm drug-coated Lutonix) with Mynx closure of the left common femoral artery. This was performed due to surveillance duplex on 01/09/20 showing increasing velocities in the left iliac artery stent with concern for it becoming a threat to his femoral-femoral bypass  He presents today for follow up and to review recent duplex studies. Today he says his legs are doing well. He actually feels that they are doing better. His activity has increased and he has been doing a lot of work outside. He denies any claudication symptoms, rest pain or non healing wounds. He is still smoking.   He has history of left common iliac stenting by Dr. Bridgett Larsson on 02/15/2014.  Ultimately at a later date he underwent a left to right femoral-femoral bypass 04/11/2014.  Then he had a drug-coated balloon angioplasty of the left common iliac stent and extension of the stent into the external iliac artery on 11/12/2016.  All performed by Dr. Bridgett Larsson.    The pt is on a statin for cholesterol management.  The pt is on a daily aspirin.   Jeffrey AC:  Plavix The pt is on CCB, ACE, BBfor hypertension.   The pt is not diabetic.   Tobacco hx:  Current every day   Past Medical History:  Diagnosis Date  . AAA (abdominal aortic aneurysm) ([place]) 02/03/2018   3cm fusiform, noted on CT abd   . Adrenal myelolipoma 03/07/2018   Small, right 1.9x1.5cm, noted on MRI ABD  . Aortic atherosclerosis  ([place]) 03/07/2018   Severe, noted on MRI ABD  . Cyst of left kidney 03/07/2018   Multiple, noted on MRI ABD  . Diverticulosis 02/03/2018   descending ang sigmoid, noted on CT abd   . GERD (gastroesophageal reflux disease)   . Grade I diastolic dysfunction 42/35/3614   Noted on ECHO   . H/O renal artery stenosis   . Hyperlipidemia   . Hypertension   . Intermittent claudication ([place])   . LVH (left ventricular hypertrophy) 04/04/2014   Mild, noted on ECHO  . Peripheral arterial disease ([place])   . Renal artery stenosis ([place])   . Right renal mass 03/07/2018   2.7 cm, noted on MRI ABD  . Thrombus of aorta ([place]) 02/03/2018   progressive mural, noted on CT abd   . TIA (transient ischemic attack) 04/2013   went to [facility] for evaluation    Past Surgical History:  Procedure Laterality Date  . ABDOMINAL AORTAGRAM N/A 02/15/2014   Procedure: ABDOMINAL Maxcine Ham;  Surgeon: Conrad [place], MD;  Location: [facility] CATH LAB;  Service: Cardiovascular;  Laterality: N/A;  . ABDOMINAL AORTOGRAM N/A 01/18/2020   Procedure: ABDOMINAL AORTOGRAM;  Surgeon: Marty Heck, MD;  Location: [place] CV LAB;  Service: Cardiovascular;  Laterality: N/A;  . ABDOMINAL AORTOGRAM W/LOWER EXTREMITY N/A 11/12/2016   Procedure: Abdominal Aortogram w/Lower Extremity;  Surgeon: Conrad [place], MD;  Location: [place] CV LAB;  Service: Cardiovascular;  Laterality: N/A;  . CHOLECYSTECTOMY    .  COLONOSCOPY    . FEMORAL-FEMORAL BYPASS GRAFT Bilateral 04/11/2014   Procedure: BYPASS GRAFT LEFT-RIGHT FEMORAL-FEMORAL ARTERY and bilateral femoral endarterectomies with Bovine Patch Angioplasties.;  Surgeon: Conrad [place], MD;  Location: [place];  Service: Vascular;  Laterality: Bilateral;  . LAPAROSCOPIC NEPHRECTOMY Right 05/26/2018   Procedure: LAPAROSCOPIC RADICAL NEPHRECTOMY;  Surgeon: Raynelle Bring, MD;  Location: [facility] ORS;  Service: Urology;  Laterality: Right;  . PERIPHERAL VASCULAR BALLOON ANGIOPLASTY Left 01/18/2020    Procedure: PERIPHERAL VASCULAR BALLOON ANGIOPLASTY;  Surgeon: Marty Heck, MD;  Location: [place] CV LAB;  Service: Cardiovascular;  Laterality: Left;  external iliac  . PERIPHERAL VASCULAR INTERVENTION  11/12/2016   Procedure: Peripheral Vascular Intervention;  Surgeon: Conrad [place], MD;  Location: [place] CV LAB;  Service: Cardiovascular;;  . RENAL ARTERY STENT Left 2015  . VASCULAR SURGERY      Social History   Socioeconomic History  . Marital status: Married    Spouse name: Not on file  . Number of children: Not on file  . Years of education: Not on file  . Highest education level: Not on file  Occupational History  . Occupation: retired  Tobacco Use  . Smoking status: Current Every Day Smoker    Packs/day: 1.50    Years: 53.00    Pack years: 79.50    Types: Cigarettes  . Smokeless tobacco: Never Used  Vaping Use  . Vaping Use: Never used  Substance and Sexual Activity  . Alcohol use: No    Alcohol/week: 0.0 standard drinks  . Drug use: No  . Sexual activity: Not on file  Jeffrey Topics Concern  . Not on file  Social History Narrative  . Not on file   Social Determinants of Health   Financial Resource Strain:   . Difficulty of Paying Living Expenses:   Food Insecurity:   . Worried About Charity fundraiser in the Last Year:   . Arboriculturist in the Last Year:   Transportation Needs:   . Film/video editor (Medical):   Marland Kitchen Lack of Transportation (Non-Medical):   Physical Activity:   . Days of Exercise per Week:   . Minutes of Exercise per Session:   Stress:   . Feeling of Stress :   Social Connections:   . Frequency of Communication with Friends and Family:   . Frequency of Social Gatherings with Friends and Family:   . Attends Religious Services:   . Active Member of Clubs or Organizations:   . Attends Archivist Meetings:   Marland Kitchen Marital Status:   Intimate Partner Violence:   . Fear of Current or Ex-Partner:   . Emotionally  Abused:   Marland Kitchen Physically Abused:   . Sexually Abused:     Family History  Problem Relation Age of Onset  . Diabetes Mother   . Hypertension Brother   . Heart attack Brother     Current Outpatient Medications  Medication Sig Dispense Refill  . amLODipine (NORVASC) 10 MG tablet Take 10 mg by mouth daily.     Marland Kitchen aspirin 81 MG tablet Take 81 mg by mouth daily. Reported on 11/01/2015    . benazepril (LOTENSIN) 40 MG tablet Take 40 mg by mouth every evening.     . clopidogrel (PLAVIX) 75 MG tablet Take 1 tablet (75 mg total) by mouth daily. 90 tablet 3  . metoprolol (LOPRESSOR) 50 MG tablet Take 50 mg by mouth 2 (two) times daily.   5  .  pravastatin (PRAVACHOL) 40 MG tablet Take 40 mg by mouth at bedtime.      No current facility-administered medications for this visit.    No Known Allergies   REVIEW OF SYSTEMS:   [X]  denotes positive finding, [ ]  denotes negative finding Cardiac  Comments:  Chest pain or chest pressure:    Shortness of breath upon exertion:    Short of breath when lying flat:    Irregular heart rhythm:        Vascular    Pain in calf, thigh, or hip brought on by ambulation:    Pain in feet at night that wakes you up from your sleep:     Blood clot in your veins:    Leg swelling:         Pulmonary    Oxygen at home:    Productive cough:     Wheezing:         Neurologic    Sudden weakness in arms or legs:     Sudden numbness in arms or legs:     Sudden onset of difficulty speaking or slurred speech:    Temporary loss of vision in one eye:     Problems with dizziness:         Gastrointestinal    Blood in stool:     Vomited blood:         Genitourinary    Burning when urinating:     Blood in urine:        Psychiatric    Major depression:         Hematologic    Bleeding problems:    Problems with blood clotting too easily:        Skin    Rashes or ulcers:        Constitutional    Fever or chills:      PHYSICAL EXAMINATION:  Vitals:    02/27/20 0950  BP: 135/79  Pulse: (!) 53  Resp: 20  Temp: 97.6 F (36.4 C)  TempSrc: Temporal  SpO2: 97%  Weight: 184 lb 4.8 oz (83.6 kg)  Height: 6\' 2"  (1.88 m)    General:  WDWN in NAD; vital signs documented above Gait: Normal HENT: WNL, normocephalic Pulmonary: normal non-labored breathing , without wheezing Cardiac: regular HR, without  Murmurs without carotid bruit Abdomen: soft, NT, no masses, normal bowel sounds Vascular Exam/Pulses:2+ bilateral radial pulses. 2+ bilateral femoral pulses, palpable pulse in fem-fem bypass graft. Bilateral feet warm with palpable DP pulses. Motor and sensation intact Extremities: without ischemic changes, without Gangrene , without cellulitis; without open wounds;  Musculoskeletal: no muscle wasting or atrophy  Neurologic: A&O X 3;  No focal weakness or paresthesias are detected Psychiatric:  The pt has Normal affect.   Non-Invasive Vascular Imaging:  02/22/20  Left to right Femoral to femoral Graft #1:  +------------------+--------+--------+----------+-----------------+           PSV cm/sStenosisWaveform Comments       +------------------+--------+--------+----------+-----------------+  Inflow      74       biphasic broad        +------------------+--------+--------+----------+-----------------+  Prox Anastomosis 117       triphasic 1.96 x 2.07 anast  +------------------+--------+--------+----------+-----------------+  Proximal Graft  80       triphasic            +------------------+--------+--------+----------+-----------------+  Mid Graft     45       biphasic dampened       +------------------+--------+--------+----------+-----------------+  Distal Graft   39       biphasic dampened       +------------------+--------+--------+----------+-----------------+  Distal Anastomosis68       triphasic 1.78 x  4.69 anast  +------------------+--------+--------+----------+-----------------+  Outflow      84       monophasic           +------------------+--------+--------+----------+-----------------+   Left Stent(s):  +---------------+---+---------------+---------++  Prox to Stent 27250-99% stenosistriphasic  +---------------+---+---------------+---------++  Proximal Stent 31150-99% stenosistriphasic  +---------------+---+---------------+---------++  Mid Stent   141        triphasic  +---------------+---+---------------+---------++  Distal Stent  132        triphasic  +---------------+---+---------------+---------++  Distal to Stent130        triphasic  +---------------+---+---------------+---------++   Aorta distal 44 cm/s.  Right CFA 63 cm/s broad biphasic waveform. Left CFA 75 cm/s broad biphasic waveform   Summary:  IVC/Iliac: Patent left stent with 50 - 99% stenosis at what appears to be in inflow artery and proximal segment of the stent, limited visualization of stent walls.   +-------+-----------+-----------+------------+------------+  ABI/TBIToday's ABIToday's TBIPrevious ABIPrevious TBI  +-------+-----------+-----------+------------+------------+  Right 0.98    0.60    0.94    0.79      +-------+-----------+-----------+------------+------------+  Left  0.94    0.61    0.89    0.80      +-------+-----------+-----------+------------+------------+  Bilateral TBI's slightly decreased from previous study 01/09/20  ASSESSMENT/PLAN:: 69 y.o. male here for follow up for peripheral vascular disease. He is s/p recent Abdominal Aortogram with catheter selection of the aorta and left iliac arteriogram and evaluation of the femoral-femoral bypass. Left external iliac artery angioplasty with drug-coated balloon for in-stent restenosis (8 mm x 40 mm drug-coated Lutonix)  on 01/18/20 by Dr. Carlis Abbott.  His recent arterial duplex shows patent femoral- femoral bypass graft. He does have increased velocities in the left common iliac artery proximal to and extending into the proximal stent with velocities >275. ABI's remain essentially unchanged at 0.98 on the right and 0.94 on the left. Slight decrease in TBI's bilaterally. He is presently without symptoms. As before he is hesitant to undergo any intervention at this time especially having undergone recent intervention in May. I re discussed concerns that this could be risk for graft failure or further surgical interventions if stents were to become occluded. However I think it is reasonable to continue to closely monitor this area. Will plan to get Fem- fem duplex and ABI's again in 3 months. I have advised him to call for earlier follow up should he develop symptoms or if he were to notice absence of a pulse in his bypass graft. - re discussed importance of smoking cessation  - he will continue his Aspirin, Plavix, and Statin - AAA stable at last visit in May at 3.3 cm. He will need annual surveillance of this  Karoline Caldwell, [facility] [phone]  Clinic MD:  Dr. Carlis Abbott

## 2020-02-29 ENCOUNTER — Other Ambulatory Visit: Payer: Self-pay | Admitting: *Deleted

## 2020-02-29 DIAGNOSIS — I739 Peripheral vascular disease, unspecified: Secondary | ICD-10-CM

## 2020-03-11 DIAGNOSIS — Z23 Encounter for immunization: Secondary | ICD-10-CM | POA: Diagnosis not present

## 2020-03-21 DIAGNOSIS — H2513 Age-related nuclear cataract, bilateral: Secondary | ICD-10-CM | POA: Diagnosis not present

## 2020-03-21 DIAGNOSIS — H40033 Anatomical narrow angle, bilateral: Secondary | ICD-10-CM | POA: Diagnosis not present

## 2020-04-08 DIAGNOSIS — Z23 Encounter for immunization: Secondary | ICD-10-CM | POA: Diagnosis not present

## 2020-05-15 DIAGNOSIS — Z720 Tobacco use: Secondary | ICD-10-CM | POA: Diagnosis not present

## 2020-05-15 DIAGNOSIS — Z23 Encounter for immunization: Secondary | ICD-10-CM | POA: Diagnosis not present

## 2020-05-15 DIAGNOSIS — I129 Hypertensive chronic kidney disease with stage 1 through stage 4 chronic kidney disease, or unspecified chronic kidney disease: Secondary | ICD-10-CM | POA: Diagnosis not present

## 2020-05-15 DIAGNOSIS — N183 Chronic kidney disease, stage 3 unspecified: Secondary | ICD-10-CM | POA: Diagnosis not present

## 2020-05-15 DIAGNOSIS — Z905 Acquired absence of kidney: Secondary | ICD-10-CM | POA: Diagnosis not present

## 2020-05-15 DIAGNOSIS — Z8679 Personal history of other diseases of the circulatory system: Secondary | ICD-10-CM | POA: Diagnosis not present

## 2020-06-03 ENCOUNTER — Other Ambulatory Visit: Payer: Self-pay

## 2020-06-03 ENCOUNTER — Ambulatory Visit (INDEPENDENT_AMBULATORY_CARE_PROVIDER_SITE_OTHER)
Admission: RE | Admit: 2020-06-03 | Discharge: 2020-06-03 | Disposition: A | Discharge status: Home / Self Care | Payer: Medicare Other | Source: Ambulatory Visit | Attending: Vascular Surgery | Admitting: Vascular Surgery

## 2020-06-03 ENCOUNTER — Ambulatory Visit (INDEPENDENT_AMBULATORY_CARE_PROVIDER_SITE_OTHER): Payer: Medicare Other | Admitting: Physician Assistant

## 2020-06-03 ENCOUNTER — Ambulatory Visit (HOSPITAL_COMMUNITY)
Admission: RE | Admit: 2020-06-03 | Discharge: 2020-06-03 | Disposition: A | Discharge status: Home / Self Care | Payer: Medicare Other | Source: Ambulatory Visit | Attending: Vascular Surgery | Admitting: Vascular Surgery

## 2020-06-03 VITALS — BP 147/90 | HR 63 | Temp 97.7°F | Resp 20 | Ht 74.0 in | Wt 183.3 lb

## 2020-06-03 DIAGNOSIS — I745 Embolism and thrombosis of iliac artery: Secondary | ICD-10-CM

## 2020-06-03 DIAGNOSIS — I739 Peripheral vascular disease, unspecified: Secondary | ICD-10-CM

## 2020-06-03 NOTE — Progress Notes (Signed)
HISTORY AND PHYSICAL     CC:  follow up. Requesting Provider:  Jaynee Eagles, MD  HPI: This is a 69 y.o. male who is here today for follow up for PAD.  He has hx of angioplasty and stenting of the left CIA as well as left angioplasty and stenting of the left renal artery on 02/15/2014 by Dr. Bridgett Larsson.  On 04/11/2014, he had left to right femoral to femoral bypass with right femoral endarterectomy with bovine patch angioplasty also by Dr. Bridgett Larsson.  In March 2018, he underwent left CIA drug coated angioplasty as well as left CIA and EIA angioplasty stenting with left renal artery angiogram.  In May 2021, he underwent angiogram by Dr. Carlis Abbott on 01/18/20. He had ultrasound-guided access of left common femoral artery above femorofemoral anastomosis.Aortogram including catheter selection of the aorta and left iliac arteriogram and evaluation of the femoral-femoral bypass.Left external iliac artery angioplasty with drug-coated balloon for in-stent restenosis (8 mm x 40 mm drug-coatedLutonix) with Mynx closure of the left common femoral artery. This was performed due to surveillance duplex on 01/09/20 showing increasing velocities in the left iliac artery stent with concern for it becoming a threat to his femoral-femoral bypass.   Pt was last seen July 2021 and at that time, pt was doing well. He did have some increased velocities in the left CIA proximal to and extending into the proximal stent.  His ABI's were essentially unchanged with slight decrease in TBI.  He was not having any sx and was hesitant to undergo any intervention.  She discussed concerns for possible graft failure or other surgical interventions if stents became occluded.  He was scheduled for close follow up for 3 months.  He is here today for that visit.  He was maintained on asa/plavix and statin.  He also has hx of AAA that measured 3.3cm in May.   The pt returns today for follow up.  He states he is doing well and denies any pain in his feet,  non healing wounds or claudication sx.    The pt is on a statin for cholesterol management.    The pt is on an aspirin.    Other AC:  Plavix The pt is on BB, ACEI for hypertension.  The pt does not have diabetes. Tobacco hx:  current    Past Medical History:  Diagnosis Date  . AAA (abdominal aortic aneurysm) ([place]) 02/03/2018   3cm fusiform, noted on CT abd   . Adrenal myelolipoma 03/07/2018   Small, right 1.9x1.5cm, noted on MRI ABD  . Aortic atherosclerosis ([place]) 03/07/2018   Severe, noted on MRI ABD  . Cyst of left kidney 03/07/2018   Multiple, noted on MRI ABD  . Diverticulosis 02/03/2018   descending ang sigmoid, noted on CT abd   . GERD (gastroesophageal reflux disease)   . Grade I diastolic dysfunction 76/73/4193   Noted on ECHO   . H/O renal artery stenosis   . Hyperlipidemia   . Hypertension   . Intermittent claudication ([place])   . LVH (left ventricular hypertrophy) 04/04/2014   Mild, noted on ECHO  . Peripheral arterial disease ([place])   . Renal artery stenosis ([place])   . Right renal mass 03/07/2018   2.7 cm, noted on MRI ABD  . Thrombus of aorta ([place]) 02/03/2018   progressive mural, noted on CT abd   . TIA (transient ischemic attack) 04/2013   went to [facility] for evaluation    Past Surgical History:  Procedure  Laterality Date  . ABDOMINAL AORTAGRAM N/A 02/15/2014   Procedure: ABDOMINAL Maxcine Ham;  Surgeon: Conrad Milledgeville, MD;  Location: [facility] CATH LAB;  Service: Cardiovascular;  Laterality: N/A;  . ABDOMINAL AORTOGRAM N/A 01/18/2020   Procedure: ABDOMINAL AORTOGRAM;  Surgeon: Marty Heck, MD;  Location: [place] CV LAB;  Service: Cardiovascular;  Laterality: N/A;  . ABDOMINAL AORTOGRAM W/LOWER EXTREMITY N/A 11/12/2016   Procedure: Abdominal Aortogram w/Lower Extremity;  Surgeon: Conrad Olancha, MD;  Location: [place] CV LAB;  Service: Cardiovascular;  Laterality: N/A;  . CHOLECYSTECTOMY    . COLONOSCOPY    . FEMORAL-FEMORAL BYPASS GRAFT Bilateral  04/11/2014   Procedure: BYPASS GRAFT LEFT-RIGHT FEMORAL-FEMORAL ARTERY and bilateral femoral endarterectomies with Bovine Patch Angioplasties.;  Surgeon: Conrad Sikes, MD;  Location: [place];  Service: Vascular;  Laterality: Bilateral;  . LAPAROSCOPIC NEPHRECTOMY Right 05/26/2018   Procedure: LAPAROSCOPIC RADICAL NEPHRECTOMY;  Surgeon: Raynelle Bring, MD;  Location: [facility] ORS;  Service: Urology;  Laterality: Right;  . PERIPHERAL VASCULAR BALLOON ANGIOPLASTY Left 01/18/2020   Procedure: PERIPHERAL VASCULAR BALLOON ANGIOPLASTY;  Surgeon: Marty Heck, MD;  Location: [place] CV LAB;  Service: Cardiovascular;  Laterality: Left;  external iliac  . PERIPHERAL VASCULAR INTERVENTION  11/12/2016   Procedure: Peripheral Vascular Intervention;  Surgeon: Conrad Emmetsburg, MD;  Location: [place] CV LAB;  Service: Cardiovascular;;  . RENAL ARTERY STENT Left 2015  . VASCULAR SURGERY      No Known Allergies  Current Outpatient Medications  Medication Sig Dispense Refill  . amLODipine (NORVASC) 10 MG tablet Take 10 mg by mouth daily.     Marland Kitchen aspirin 81 MG tablet Take 81 mg by mouth daily. Reported on 11/01/2015    . benazepril (LOTENSIN) 40 MG tablet Take 40 mg by mouth every evening.     . clopidogrel (PLAVIX) 75 MG tablet Take 1 tablet (75 mg total) by mouth daily. 90 tablet 3  . metoprolol (LOPRESSOR) 50 MG tablet Take 50 mg by mouth 2 (two) times daily.   5  . pravastatin (PRAVACHOL) 40 MG tablet Take 40 mg by mouth at bedtime.      No current facility-administered medications for this visit.    Family History  Problem Relation Age of Onset  . Diabetes Mother   . Hypertension Brother   . Heart attack Brother     Social History   Socioeconomic History  . Marital status: Married    Spouse name: Not on file  . Number of children: Not on file  . Years of education: Not on file  . Highest education level: Not on file  Occupational History  . Occupation: retired  Tobacco Use  . Smoking  status: Current Every Day Smoker    Packs/day: 1.50    Years: 53.00    Pack years: 79.50    Types: Cigarettes  . Smokeless tobacco: Never Used  Vaping Use  . Vaping Use: Never used  Substance and Sexual Activity  . Alcohol use: No    Alcohol/week: 0.0 standard drinks  . Drug use: No  . Sexual activity: Not on file  Other Topics Concern  . Not on file  Social History Narrative  . Not on file   Social Determinants of Health   Financial Resource Strain:   . Difficulty of Paying Living Expenses: Not on file  Food Insecurity:   . Worried About Charity fundraiser in the Last Year: Not on file  . Ran Out of Food in the Last  Year: Not on file  Transportation Needs:   . Lack of Transportation (Medical): Not on file  . Lack of Transportation (Non-Medical): Not on file  Physical Activity:   . Days of Exercise per Week: Not on file  . Minutes of Exercise per Session: Not on file  Stress:   . Feeling of Stress : Not on file  Social Connections:   . Frequency of Communication with Friends and Family: Not on file  . Frequency of Social Gatherings with Friends and Family: Not on file  . Attends Religious Services: Not on file  . Active Member of Clubs or Organizations: Not on file  . Attends Archivist Meetings: Not on file  . Marital Status: Not on file  Intimate Partner Violence:   . Fear of Current or Ex-Partner: Not on file  . Emotionally Abused: Not on file  . Physically Abused: Not on file  . Sexually Abused: Not on file     REVIEW OF SYSTEMS:   [X]  denotes positive finding, [ ]  denotes negative finding Cardiac  Comments:  Chest pain or chest pressure:    Shortness of breath upon exertion:    Short of breath when lying flat:    Irregular heart rhythm:        Vascular    Pain in calf, thigh, or hip brought on by ambulation:    Pain in feet at night that wakes you up from your sleep:     Blood clot in your veins:    Leg swelling:         Pulmonary      Oxygen at home:    Productive cough:     Wheezing:         Neurologic    Sudden weakness in arms or legs:     Sudden numbness in arms or legs:     Sudden onset of difficulty speaking or slurred speech:    Temporary loss of vision in one eye:     Problems with dizziness:         Gastrointestinal    Blood in stool:     Vomited blood:         Genitourinary    Burning when urinating:     Blood in urine:        Psychiatric    Major depression:         Hematologic    Bleeding problems:    Problems with blood clotting too easily:        Skin    Rashes or ulcers:        Constitutional    Fever or chills:      PHYSICAL EXAMINATION:  Today's Vitals   06/03/20 1121  BP: (!) 147/90  Pulse: 63  Resp: 20  Temp: 97.7 F (36.5 C)  TempSrc: Temporal  SpO2: 99%  Weight: 183 lb 4.8 oz (83.1 kg)  Height: 6\' 2"  (1.88 m)   Body mass index is 23.53 kg/m.   General:  WDWN in NAD; vital signs documented above Gait: Normal HENT: WNL, normocephalic Pulmonary: normal non-labored breathing , without wheezing Cardiac: regular HR, without  Murmur; without carotid bruits Abdomen: soft, NT, no masses; aortic pulse is not palpable Skin: without rashes Vascular Exam/Pulses:  Right Left  Radial 2+ (normal) 2+ (normal)  Ulnar 2+ (normal) 2+ (normal)  Femoral 2+ (normal) 2+ (normal)  Popliteal .Unable to palpate Unable to palpate  DP 2+ (normal) 2+ (normal)  PT 2+ (normal) 1+ (weak)  Extremities: without ischemic changes, without Gangrene , without cellulitis; without open wounds;  Musculoskeletal: no muscle wasting or atrophy  Neurologic: A&O X 3;  No focal weakness or paresthesias are detected Psychiatric:  The pt has Normal affect.   Non-Invasive Vascular Imaging:   ABI's/TBI's on 06/03/2020: Right:  1.08/0.68 - Great toe pressure: 99 Left:  1.01/0.61 - Great toe pressure: 88  Arterial duplex on 06/03/2020: Left to right FFBPG :   +------------------+--------+--------+----------+--------------------+           PSV cm/sStenosisWaveform Comments        +------------------+--------+--------+----------+--------------------+  Inflow      68       biphasic             +------------------+--------+--------+----------+--------------------+  Prox Anastomosis 90       triphasic anast 1.92 x 2.26 cm  +------------------+--------+--------+----------+--------------------+  Proximal Graft  53       biphasic             +------------------+--------+--------+----------+--------------------+  Mid Graft     49       biphasic             +------------------+--------+--------+----------+--------------------+  Distal Graft   49       biphasic             +------------------+--------+--------+----------+--------------------+  Distal Anastomosis72       biphasic anast 1.93 x 1.93 cm  +------------------+--------+--------+----------+--------------------+  Outflow      111       monophasic            +------------------+--------+--------+----------+--------------------+   Right CFA 62 cm/s biphasic waveform. Left CFA 56 cm/s biphasic waveform.      Left Stent(s):  +---------------+---------++---------+-----------+  Prox to Stent 154   triphasic        +---------------+---------++---------+-----------+  Proximal Stent 145   biphasic         +---------------+---------++---------+-----------+  Mid Stent   277 / 172biphasic curve , NWV  +---------------+---------++---------+-----------+  Distal Stent  115   biphasic         +---------------+---------++---------+-----------+  Distal to Stent48    biphasic         +---------------+---------++---------+-----------+               Summary:  Right: Patent left to right femoral to femoral bypass graft with no  visualized stenosis.   Patent left common iliac stent (with extention in the external ilac  artery) with increased velocity in what appears to be the proximal to mid  segment at area of curve.   Previous ABI's/TBI's on 02/22/2020: Right:  0.98/0.60 - Great toe pressure: 96 Left:  0.94/0.61 - Great toe pressure:  98  Previous arterial duplex on 02/22/2020: Left to right Femoral to femoral Graft #1:  +------------------+--------+--------+----------+-----------------+           PSV cm/sStenosisWaveform Comments       +------------------+--------+--------+----------+-----------------+  Inflow      74       biphasic broad        +------------------+--------+--------+----------+-----------------+  Prox Anastomosis 117       triphasic 1.96 x 2.07 anast  +------------------+--------+--------+----------+-----------------+  Proximal Graft  80       triphasic            +------------------+--------+--------+----------+-----------------+  Mid Graft     45       biphasic dampened       +------------------+--------+--------+----------+-----------------+  Distal Graft   39       biphasic dampened       +------------------+--------+--------+----------+-----------------+  Distal Anastomosis68       triphasic 1.78 x 4.69 anast  +------------------+--------+--------+----------+-----------------+  Outflow      84       monophasic           +------------------+--------+--------+----------+-----------------+   Left Stent(s):  +---------------+---+---------------+---------++  Prox to Stent 27250-99% stenosistriphasic  +---------------+---+---------------+---------++  Proximal Stent 31150-99% stenosistriphasic   +---------------+---+---------------+---------++  Mid Stent   141        triphasic  +---------------+---+---------------+---------++  Distal Stent  132        triphasic  +---------------+---+---------------+---------++  Distal to Stent130        triphasic  +---------------+---+---------------+---------++   Aorta distal 44 cm/s.  Right CFA 63 cm/s broad biphasic waveform. Left CFA 75 cm/s broad biphasic waveform   Summary:  IVC/Iliac: Patent left stent with 50 - 99% stenosis at what appears to be  in inflow artery and proximal segment of the stent, limited visualization  of stent walls.   The left to right femoral to femoral bypass graft appears patent with no  visualized stenosis   ASSESSMENT/PLAN:: 69 y.o. male here for follow up for PAD with hx of  hx of angioplasty and stenting of the left CIA as well as left angioplasty and stenting of the left renal artery on 02/15/2014 by Dr. Bridgett Larsson.  On 04/11/2014, he had left to right femoral to femoral bypass with right femoral endarterectomy with bovine patch angioplasty also by Dr. Bridgett Larsson.  In March 2018, he underwent left CIA drug coated angioplasty as well as left CIA and EIA angioplasty stenting with left renal artery angiogram.  Most recently underwent Left external iliac artery angioplasty with drug-coated balloon for in-stent restenosis (8 mm x 40 mm drug-coatedLutonix) with Mynx closure of the left common femoral artery in May 2021 by Dr. Carlis Abbott who returns today for follow up.    -pt doing well without any rest pain, ulcers or claudication symptoms.  His ABIs look good and he has palpable pedal pulses.    -will repeat his ABI, duplex fem fem bypass with left iliac artery as well as AAA duplex as well as left renal artery stent in 6 months.   -discussed that if he were to develop any symptoms prior to that visit, to contact us sooner.  I also discussed s/s of rupture AAA and that he would need to call  [phone] and get to the ER immediately.   -discussed smoking cessation and its importance.     Leontine Locket, [facility] [phone]  Clinic MD:   Trula Slade

## 2020-06-04 ENCOUNTER — Other Ambulatory Visit: Payer: Self-pay | Admitting: *Deleted

## 2020-06-04 DIAGNOSIS — I745 Embolism and thrombosis of iliac artery: Secondary | ICD-10-CM

## 2020-06-04 DIAGNOSIS — I70211 Atherosclerosis of native arteries of extremities with intermittent claudication, right leg: Secondary | ICD-10-CM

## 2020-06-04 DIAGNOSIS — I739 Peripheral vascular disease, unspecified: Secondary | ICD-10-CM

## 2020-06-04 DIAGNOSIS — I150 Renovascular hypertension: Secondary | ICD-10-CM

## 2020-06-04 DIAGNOSIS — I714 Abdominal aortic aneurysm, without rupture, unspecified: Secondary | ICD-10-CM

## 2020-08-28 DIAGNOSIS — D1779 Benign lipomatous neoplasm of other sites: Secondary | ICD-10-CM | POA: Diagnosis not present

## 2020-08-28 DIAGNOSIS — J439 Emphysema, unspecified: Secondary | ICD-10-CM | POA: Diagnosis not present

## 2020-08-28 DIAGNOSIS — C641 Malignant neoplasm of right kidney, except renal pelvis: Secondary | ICD-10-CM | POA: Diagnosis not present

## 2020-08-28 DIAGNOSIS — N281 Cyst of kidney, acquired: Secondary | ICD-10-CM | POA: Diagnosis not present

## 2020-08-28 DIAGNOSIS — I745 Embolism and thrombosis of iliac artery: Secondary | ICD-10-CM | POA: Diagnosis not present

## 2020-08-28 DIAGNOSIS — I2510 Atherosclerotic heart disease of native coronary artery without angina pectoris: Secondary | ICD-10-CM | POA: Diagnosis not present

## 2020-09-04 DIAGNOSIS — C641 Malignant neoplasm of right kidney, except renal pelvis: Secondary | ICD-10-CM | POA: Diagnosis not present

## 2020-10-31 DIAGNOSIS — N390 Urinary tract infection, site not specified: Secondary | ICD-10-CM | POA: Diagnosis not present

## 2020-12-02 ENCOUNTER — Other Ambulatory Visit: Payer: Self-pay | Admitting: *Deleted

## 2020-12-02 DIAGNOSIS — I714 Abdominal aortic aneurysm, without rupture, unspecified: Secondary | ICD-10-CM

## 2020-12-02 DIAGNOSIS — I70211 Atherosclerosis of native arteries of extremities with intermittent claudication, right leg: Secondary | ICD-10-CM

## 2020-12-02 DIAGNOSIS — I739 Peripheral vascular disease, unspecified: Secondary | ICD-10-CM

## 2020-12-09 ENCOUNTER — Other Ambulatory Visit: Payer: Self-pay

## 2020-12-09 DIAGNOSIS — I739 Peripheral vascular disease, unspecified: Secondary | ICD-10-CM

## 2020-12-09 MED ORDER — CLOPIDOGREL BISULFATE 75 MG PO TABS: 75.0000 mg | ORAL_TABLET | Freq: Every day | ORAL | 3 refills | Status: DC

## 2020-12-23 ENCOUNTER — Ambulatory Visit (HOSPITAL_COMMUNITY)
Admission: RE | Admit: 2020-12-23 | Discharge: 2020-12-23 | Disposition: A | Discharge status: Home / Self Care | Payer: Medicare Other | Source: Ambulatory Visit | Attending: Vascular Surgery | Admitting: Vascular Surgery

## 2020-12-23 ENCOUNTER — Other Ambulatory Visit: Payer: Self-pay

## 2020-12-23 ENCOUNTER — Ambulatory Visit (INDEPENDENT_AMBULATORY_CARE_PROVIDER_SITE_OTHER)
Admission: RE | Admit: 2020-12-23 | Discharge: 2020-12-23 | Disposition: A | Discharge status: Home / Self Care | Payer: Medicare Other | Source: Ambulatory Visit | Attending: Vascular Surgery | Admitting: Vascular Surgery

## 2020-12-23 DIAGNOSIS — I714 Abdominal aortic aneurysm, without rupture, unspecified: Secondary | ICD-10-CM

## 2020-12-23 DIAGNOSIS — I70211 Atherosclerosis of native arteries of extremities with intermittent claudication, right leg: Secondary | ICD-10-CM

## 2020-12-23 DIAGNOSIS — I739 Peripheral vascular disease, unspecified: Secondary | ICD-10-CM

## 2020-12-30 ENCOUNTER — Ambulatory Visit (INDEPENDENT_AMBULATORY_CARE_PROVIDER_SITE_OTHER)
Admission: RE | Admit: 2020-12-30 | Discharge: 2020-12-30 | Disposition: A | Discharge status: Home / Self Care | Payer: Medicare Other | Source: Ambulatory Visit | Attending: Vascular Surgery | Admitting: Vascular Surgery

## 2020-12-30 ENCOUNTER — Ambulatory Visit (HOSPITAL_COMMUNITY)
Admission: RE | Admit: 2020-12-30 | Discharge: 2020-12-30 | Disposition: A | Discharge status: Home / Self Care | Payer: Medicare Other | Source: Ambulatory Visit | Attending: Surgery | Admitting: Surgery

## 2020-12-30 ENCOUNTER — Encounter (HOSPITAL_COMMUNITY): Payer: Medicare Other

## 2020-12-30 ENCOUNTER — Other Ambulatory Visit: Payer: Self-pay

## 2020-12-30 ENCOUNTER — Ambulatory Visit (INDEPENDENT_AMBULATORY_CARE_PROVIDER_SITE_OTHER): Payer: Medicare Other | Admitting: Physician Assistant

## 2020-12-30 VITALS — BP 152/84 | HR 52 | Temp 97.6°F | Resp 18 | Ht 74.0 in | Wt 182.0 lb

## 2020-12-30 DIAGNOSIS — I739 Peripheral vascular disease, unspecified: Secondary | ICD-10-CM

## 2020-12-30 DIAGNOSIS — I714 Abdominal aortic aneurysm, without rupture, unspecified: Secondary | ICD-10-CM

## 2020-12-30 DIAGNOSIS — I70211 Atherosclerosis of native arteries of extremities with intermittent claudication, right leg: Secondary | ICD-10-CM

## 2020-12-30 NOTE — Progress Notes (Signed)
Office Note     CC:  follow up Requesting Provider:  Jaynee Eagles, MD  HPI: Jeffrey Stout is a 70 y.o. (1951/02/24) male who presents for follow up of peripheral artery disease. He has extensive vascular surgery/ intervention history which includes angioplasty and stenting of the left CIA and angioplasty and stenting of the left renal artery on 02/15/2014 by Dr. Bridgett Larsson.  On 04/11/2014, he had left to right femoral to femoral bypass with right femoral endarterectomy with bovine patch angioplasty also by Dr. Bridgett Larsson.  In March 2018, he underwent left CIA drug coated angioplasty as well as left CIA and EIA angioplasty stenting with left renal artery angiogram by Dr. Bridgett Larsson. Most recently underwent Left external iliac artery angioplasty with drug-coated balloon for in-stent restenosis (8 mm x 40 mm drug-coatedLutonix)withMynx closure of the left common femoral artery in May 2021 by Dr. Carlis Abbott.  He was last seen in in October of 2021 at which time he was doing well without any symptoms. He did have continued elevated velocities in his left CIA and stent on duplex, this was unchanged from his prior visit in July of 2021. ABIs were overall stable. He was maintained on Asprin, Statin and Plavix.   He follows up today for 6 month follow up with non invasive studies. He has known AAA last measured at 3.3 cm. He denies any abdominal or back pain. He denies any claudication symptoms, rest pain, or non healing wounds.   His blood pressure is slightly elevated at today's visit. Overall he says his blood pressure has been stable. He takes 3 medications for this. He reports that about 1 year ago his Amlodipine was decreased from BID to once daily. He does not regularly check his blood pressure at home. He does report that he only slept 3 hours because he has a sick dog at home so he feels this may be contributing.  The pt is on a statin for cholesterol management.    The pt is on an aspirin.   Other AC:  Plavix The pt  is on BB, CCB, ACEI for hypertension.  The pt does not have diabetes. Tobacco hx:  current  Past Medical History:  Diagnosis Date  . AAA (abdominal aortic aneurysm) ([place]) 02/03/2018   3cm fusiform, noted on CT abd   . Adrenal myelolipoma 03/07/2018   Small, right 1.9x1.5cm, noted on MRI ABD  . Aortic atherosclerosis ([place]) 03/07/2018   Severe, noted on MRI ABD  . Cyst of left kidney 03/07/2018   Multiple, noted on MRI ABD  . Diverticulosis 02/03/2018   descending ang sigmoid, noted on CT abd   . GERD (gastroesophageal reflux disease)   . Grade I diastolic dysfunction [id number]   Noted on ECHO   . H/O renal artery stenosis   . Hyperlipidemia   . Hypertension   . Intermittent claudication ([place])   . LVH (left ventricular hypertrophy) 04/04/2014   Mild, noted on ECHO  . Peripheral arterial disease ([place])   . Renal artery stenosis ([place])   . Right renal mass 03/07/2018   2.7 cm, noted on MRI ABD  . Thrombus of aorta ([place]) 02/03/2018   progressive mural, noted on CT abd   . TIA (transient ischemic attack) 04/2013   went to [facility] for evaluation    Past Surgical History:  Procedure Laterality Date  . ABDOMINAL AORTAGRAM N/A 02/15/2014   Procedure: ABDOMINAL Maxcine Ham;  Surgeon: Conrad Pittsburg, MD;  Location: [facility] CATH LAB;  Service: Cardiovascular;  Laterality: N/A;  . ABDOMINAL AORTOGRAM N/A 01/18/2020   Procedure: ABDOMINAL AORTOGRAM;  Surgeon: Marty Heck, MD;  Location: [place] CV LAB;  Service: Cardiovascular;  Laterality: N/A;  . ABDOMINAL AORTOGRAM W/LOWER EXTREMITY N/A 11/12/2016   Procedure: Abdominal Aortogram w/Lower Extremity;  Surgeon: Conrad Pr[place] Verde, MD;  Location: [place] CV LAB;  Service: Cardiovascular;  Laterality: N/A;  . CHOLECYSTECTOMY    . COLONOSCOPY    . FEMORAL-FEMORAL BYPASS GRAFT Bilateral 04/11/2014   Procedure: BYPASS GRAFT LEFT-RIGHT FEMORAL-FEMORAL ARTERY and bilateral femoral endarterectomies with Bovine Patch Angioplasties.;  Surgeon:  Conrad Slaughter Beach, MD;  Location: [place];  Service: Vascular;  Laterality: Bilateral;  . LAPAROSCOPIC NEPHRECTOMY Right 05/26/2018   Procedure: LAPAROSCOPIC RADICAL NEPHRECTOMY;  Surgeon: Raynelle Bring, MD;  [facility]: [facility];  Service: Urology;  Laterality: Right;  . PERIPHERAL VASCULAR BALLOON ANGIOPLASTY Left 01/18/2020   Procedure: PERIPHERAL VASCULAR BALLOON ANGIOPLASTY;  Surgeon: Marty Heck, MD;  Location: [place] [facility] LAB;  Service: Cardiovascular;  Laterality: Left;  external iliac  . PERIPHERAL VASCULAR INTERVENTION  11/12/2016   Procedure: Peripheral Vascular Intervention;  Surgeon: Conrad Metcalfe, MD;  Location: [place] CV LAB;  Service: Cardiovascular;;  . RENAL ARTERY STENT Left 2015  . VASCULAR SURGERY      Social History   Socioeconomic History  . Marital status: Married    Spouse name: Not on file  . Number of children: Not on file  . Years of education: Not on file  . Highest education level: Not on file  Occupational History  . Occupation: retired  Tobacco Use  . Smoking status: Current Every Day Smoker    Packs/day: 1.50    Years: 53.00    Pack years: 79.50    Types: Cigarettes  . Smokeless tobacco: Never Used  Vaping Use  . Vaping Use: Never used  Substance and Sexual Activity  . Alcohol use: No    Alcohol/week: 0.0 standard drinks  . Drug use: No  . Sexual activity: Not on file  Other Topics Concern  . Not on file  Social History Narrative  . Not on file   Social Determinants of Health   Financial Resource Strain: Not on file  Food Insecurity: Not on file  Transportation Needs: Not on file  Physical Activity: Not on file  Stress: Not on file  Social Connections: Not on file  Intimate Partner Violence: Not on file    Family History  Problem Relation Age of Onset  . Diabetes Mother   . Hypertension Brother   . Heart attack Brother     Current Outpatient Medications  Medication Sig Dispense Refill  . amLODipine (NORVASC) 10 MG tablet  Take 10 mg by mouth daily.     Marland Kitchen aspirin 81 MG tablet Take 81 mg by mouth daily. Reported on 11/01/2015    . benazepril (LOTENSIN) 40 MG tablet Take 40 mg by mouth every evening.     . clopidogrel (PLAVIX) 75 MG tablet Take 1 tablet (75 mg total) by mouth daily. 90 tablet 3  . metoprolol (LOPRESSOR) 50 MG tablet Take 50 mg by mouth 2 (two) times daily.   5  . pravastatin (PRAVACHOL) 40 MG tablet Take 40 mg by mouth at bedtime.      No current facility-administered medications for this visit.    No Known Allergies   REVIEW OF SYSTEMS:  [X]  denotes positive finding, [ ]  denotes negative finding Cardiac  Comments:  Chest pain or chest pressure:  Shortness of breath upon exertion:    Short of breath when lying flat:    Irregular heart rhythm:        Vascular    Pain in calf, thigh, or hip brought on by ambulation:    Pain in feet at night that wakes you up from your sleep:     Blood clot in your veins:    Leg swelling:         Pulmonary    Oxygen at home:    Productive cough:     Wheezing:         Neurologic    Sudden weakness in arms or legs:     Sudden numbness in arms or legs:     Sudden onset of difficulty speaking or slurred speech:    Temporary loss of vision in one eye:     Problems with dizziness:         Gastrointestinal    Blood in stool:     Vomited blood:         Genitourinary    Burning when urinating:     Blood in urine:        Psychiatric    Major depression:         Hematologic    Bleeding problems:    Problems with blood clotting too easily:        Skin    Rashes or ulcers:        Constitutional    Fever or chills:      PHYSICAL EXAMINATION:  Vitals:   12/30/20 0852  Resp: 18  Weight: 182 lb (82.6 kg)  Height: 6\' 2"  (1.88 m)    General:  WDWN in NAD; vital signs documented above Gait: Normal HENT: WNL, normocephalic Pulmonary: normal non-labored breathing, without wheezing Cardiac: regular HR, without  Murmurs without carotid  bruit Abdomen: soft, NT, no masses. Aortic pulse not palpable Vascular Exam/Pulses:  Right Left  Radial 2+ (normal) 2+ (normal)  Femoral 2+ (normal) 2+ (normal)  Popliteal Not palpable Not palpable  DP 2+ (normal) 2+ (normal)  PT 2+ (normal) 1+ (weak)   Extremities: without ischemic changes, without Gangrene , without cellulitis; without open wounds;  Musculoskeletal: no muscle wasting or atrophy  Neurologic: A&O X 3;  No focal weakness or paresthesias are detected Psychiatric:  The pt has Normal affect.   Non-Invasive Vascular Imaging:   Abdominal Aorta Findings: 12/23/20 +-----------+-------+----------+----------+--------+--------+--------+  Location  AP (cm)Trans (cm)PSV (cm/s)WaveformThrombusComments  +-----------+-------+----------+----------+--------+--------+--------+  Proximal  2.39  2.46   54                  +-----------+-------+----------+----------+--------+--------+--------+  Mid    2.40  2.18   70                  +-----------+-------+----------+----------+--------+--------+--------+  Distal   2.86  2.69   212                  +-----------+-------+----------+----------+--------+--------+--------+  RT CIA Prox1.5  1.5    156                  +-----------+-------+----------+----------+--------+--------+--------+  LT CIA Prox1.2  1.2    158                  +-----------+-------+----------+----------+--------+--------+------- >50% stenosis in the distal aorta with Fusiform ectasia with thrombus/ plaque visualized. Fusiform aneurysmal dilation of the right common iliac artery.   Left to right Fem- fem bypass graft: Patent without any  evidence of stenosis   VAS US Renal Artery Ltd/ Unilateral: 12/23/20 Renal: Left: Evidence of a > 60% stenosis in the left renal artery. Cyst(s) noted. Normal size of left kidney. Normal left  Resistive Index.   +-------+-----------+-----------+------------+------------+  ABI/TBIToday's ABIToday's TBIPrevious ABIPrevious TBI  +-------+-----------+-----------+------------+------------+  Right 0.94    0.74    1.08    0.68      +-------+-----------+-----------+------------+------------+  Left  0.88    0.71    1.01    0.61      +-------+-----------+-----------+------------+------------+  Right Great toe pressure: 117 mmHg Left Great toe pressure: 122 mmHg    ASSESSMENT/PLAN:: 70 y.o. male here for follow up for peripheral artery disease. Extensive vascular history most recently undergoing left EIA angioplasty with DCB for in stent restenosis by Dr. Carlis Abbott in May of 2021. This was performed due to non invasive studies showing increased velocities. He has not had any symptoms such as rest pain, claudication or tissue loss. Today he has slightly decreased ABI bilaterally but overall stable. He has palpable pedal pulses clinically. His non invasive studies show patent Fem- Fem bypass, stable AAA at 2.89 at greatest diameter. He does have continued evidence of >50 % stenosis in the EIA stent but velocities are essentially unchanged. He additionally has some elevated velocities in his left renal artery stent. Will not plan to address either of these unless he becomes more symptomatic. Recommend he try to monitor his blood pressure more regularly at home. If there becomes issues with managing his blood pressure on his current regimen his left renal artery stenosis may need addressed - he will continue his Asprin, statin, and plavix --will repeat his ABI, duplex fem fem bypass with left iliac artery as well as AAA duplex as well as left renal artery stent in 6 months -discussed that if he were to develop any symptoms prior to that visit, to contact us sooner.  I also discussed s/s of rupture AAA and that he would need to call 911 and get to the ER  immediately      Karoline Caldwell, PA-C [facility] [phone]  Clinic MD:   Trula Slade

## 2020-12-31 ENCOUNTER — Other Ambulatory Visit: Payer: Self-pay

## 2020-12-31 DIAGNOSIS — I714 Abdominal aortic aneurysm, without rupture, unspecified: Secondary | ICD-10-CM

## 2020-12-31 DIAGNOSIS — I739 Peripheral vascular disease, unspecified: Secondary | ICD-10-CM

## 2020-12-31 DIAGNOSIS — I150 Renovascular hypertension: Secondary | ICD-10-CM

## 2021-04-23 DIAGNOSIS — I129 Hypertensive chronic kidney disease with stage 1 through stage 4 chronic kidney disease, or unspecified chronic kidney disease: Secondary | ICD-10-CM | POA: Diagnosis not present

## 2021-04-23 DIAGNOSIS — N183 Chronic kidney disease, stage 3 unspecified: Secondary | ICD-10-CM | POA: Diagnosis not present

## 2021-04-23 DIAGNOSIS — Z905 Acquired absence of kidney: Secondary | ICD-10-CM | POA: Diagnosis not present

## 2021-04-23 DIAGNOSIS — Z720 Tobacco use: Secondary | ICD-10-CM | POA: Diagnosis not present

## 2021-04-23 DIAGNOSIS — Z8679 Personal history of other diseases of the circulatory system: Secondary | ICD-10-CM | POA: Diagnosis not present

## 2021-07-14 ENCOUNTER — Ambulatory Visit (INDEPENDENT_AMBULATORY_CARE_PROVIDER_SITE_OTHER)
Admission: RE | Admit: 2021-07-14 | Discharge: 2021-07-14 | Disposition: A | Discharge status: Home / Self Care | Payer: Medicare Other | Source: Ambulatory Visit | Attending: Physician Assistant | Admitting: Physician Assistant

## 2021-07-14 ENCOUNTER — Other Ambulatory Visit: Payer: Self-pay

## 2021-07-14 ENCOUNTER — Ambulatory Visit (INDEPENDENT_AMBULATORY_CARE_PROVIDER_SITE_OTHER): Payer: Medicare Other | Admitting: Physician Assistant

## 2021-07-14 ENCOUNTER — Other Ambulatory Visit (HOSPITAL_COMMUNITY): Payer: Medicare Other

## 2021-07-14 ENCOUNTER — Ambulatory Visit (HOSPITAL_COMMUNITY)
Admission: RE | Admit: 2021-07-14 | Discharge: 2021-07-14 | Disposition: A | Discharge status: Home / Self Care | Payer: Medicare Other | Source: Ambulatory Visit | Attending: Physician Assistant | Admitting: Physician Assistant

## 2021-07-14 VITALS — BP 163/85 | HR 50 | Temp 97.5°F | Resp 20 | Ht 74.0 in | Wt 187.8 lb

## 2021-07-14 DIAGNOSIS — I150 Renovascular hypertension: Secondary | ICD-10-CM | POA: Insufficient documentation

## 2021-07-14 DIAGNOSIS — I739 Peripheral vascular disease, unspecified: Secondary | ICD-10-CM

## 2021-07-14 DIAGNOSIS — I714 Abdominal aortic aneurysm, without rupture, unspecified: Secondary | ICD-10-CM | POA: Diagnosis not present

## 2021-07-14 DIAGNOSIS — I701 Atherosclerosis of renal artery: Secondary | ICD-10-CM | POA: Diagnosis not present

## 2021-07-14 NOTE — Progress Notes (Signed)
Office Note     CC:  follow up Requesting Provider:  Jaynee Eagles, MD  HPI: Jeffrey Stout is a 70 y.o. (03/11/1951) male who presents for follow up of peripheral artery disease, RAS as well as AAA. His AAA has been small at 3.3 cm and asymptomatic. He has extensive vascular surgery/ intervention history which includes angioplasty and stenting of the left CIA and angioplasty and stenting of the left renal artery on 02/15/2014 by Dr. Bridgett Larsson.  On 04/11/2014, he had left to right femoral to femoral bypass with right femoral endarterectomy with bovine patch angioplasty also by Dr. Bridgett Larsson.  In March 2018, he underwent left CIA drug coated angioplasty as well as left CIA and EIA angioplasty stenting with left renal artery angiogram by Dr. Bridgett Larsson. Most recently underwent Left external iliac artery angioplasty with drug-coated balloon for in-stent restenosis (8 mm x 40 mm drug-coated Lutonix) with Mynx closure of the left common femoral artery in May 2021 by Dr. Carlis Abbott.   He was last seen in May of this year at which time he was doing well without any symptoms.  His non invasive studies were stable.    Today he returns for 6 month follow up with non invasive studies. He reports no abdominal or back pain. No pain on ambulation in his legs, no rest pain, no non healing wounds. He is compliant with his Aspirin, statin and Plavix. He continues to smoke 1.5 ppd   The pt is on a statin for cholesterol management.    The pt is on an aspirin.   Other AC:  Plavix The pt is on BB, CCB, ACEI for hypertension.  The pt does not have diabetes. Tobacco hx:  current  Past Medical History:  Diagnosis Date   AAA (abdominal aortic aneurysm) 02/03/2018   3cm fusiform, noted on CT abd    Adrenal myelolipoma 03/07/2018   Small, right 1.9x1.5cm, noted on MRI ABD   Aortic atherosclerosis ([place]) 03/07/2018   Severe, noted on MRI ABD   Cyst of left kidney 03/07/2018   Multiple, noted on MRI ABD   Diverticulosis 02/03/2018    descending ang sigmoid, noted on CT abd    GERD (gastroesophageal reflux disease)    Grade I diastolic dysfunction 26/94/8546   Noted on ECHO    H/O renal artery stenosis    Hyperlipidemia    Hypertension    Intermittent claudication ([facility])    LVH (left ventricular hypertrophy) 04/04/2014   Mild, noted on ECHO   Peripheral arterial disease ([place])    Renal artery stenosis ([place])    Right renal mass 03/07/2018   2.7 cm, noted on MRI ABD   Thrombus of aorta ([place]) 02/03/2018   progressive mural, noted on CT abd    TIA (transient ischemic attack) 04/2013   went to [facility] for evaluation    Past Surgical History:  Procedure Laterality Date   ABDOMINAL AORTAGRAM N/A 02/15/2014   Procedure: ABDOMINAL Maxcine Ham;  Surgeon: Conrad Waverly, MD;  Location: [facility] CATH LAB;  Service: Cardiovascular;  Laterality: N/A;   ABDOMINAL AORTOGRAM N/A 01/18/2020   Procedure: ABDOMINAL AORTOGRAM;  Surgeon: Marty Heck, MD;  Location: [place] CV LAB;  Service: Cardiovascular;  Laterality: N/A;   ABDOMINAL AORTOGRAM W/LOWER EXTREMITY N/A 11/12/2016   Procedure: Abdominal Aortogram w/Lower Extremity;  Surgeon: Conrad Stronghurst, MD;  Location: [place] CV LAB;  Service: Cardiovascular;  Laterality: N/A;   CHOLECYSTECTOMY     COLONOSCOPY     FEMORAL-FEMORAL BYPASS  GRAFT Bilateral 04/11/2014   Procedure: BYPASS GRAFT LEFT-RIGHT FEMORAL-FEMORAL ARTERY and bilateral femoral endarterectomies with Bovine Patch Angioplasties.;  Surgeon: Conrad Grand Ronde, MD;  Location: [employer];  Service: Vascular;  Laterality: Bilateral;   LAPAROSCOPIC NEPHRECTOMY Right 05/26/2018   Procedure: LAPAROSCOPIC RADICAL NEPHRECTOMY;  Surgeon: Raynelle Bring, MD;  [facility];  Service: Urology;  Laterality: Right;   PERIPHERAL VASCULAR BALLOON ANGIOPLASTY Left 01/18/2020   Procedure: PERIPHERAL VASCULAR BALLOON ANGIOPLASTY;  Surgeon: Marty Heck, MD;  Location: [place] CV LAB;  Service: Cardiovascular;  Laterality: Left;   external iliac   PERIPHERAL VASCULAR INTERVENTION  11/12/2016   Procedure: Peripheral Vascular Intervention;  Surgeon: Conrad , MD;  Location: [place] CV LAB;  Service: Cardiovascular;;   RENAL ARTERY STENT Left 2015   VASCULAR SURGERY      Social History   Socioeconomic History   Marital status: Married    Spouse name: Not on file   Number of children: Not on file   Years of education: Not on file   Highest education level: Not on file  Occupational History   Occupation: retired  Tobacco Use   Smoking status: Every Day    Packs/day: 1.50    Years: 53.00    Pack years: 79.50    Types: Cigarettes   Smokeless tobacco: Never  Vaping Use   Vaping Use: Never used  Substance and Sexual Activity   Alcohol use: No    Alcohol/week: 0.0 standard drinks   Drug use: No   Sexual activity: Not on file  Other Topics Concern   Not on file  Social History Narrative   Not on file   Social Determinants of Health   Financial Resource Strain: Not on file  Food Insecurity: Not on file  Transportation Needs: Not on file  Physical Activity: Not on file  Stress: Not on file  Social Connections: Not on file  Intimate Partner Violence: Not on file    Family History  Problem Relation Age of Onset   Diabetes Mother    Hypertension Brother    Heart attack Brother     Current Outpatient Medications  Medication Sig Dispense Refill   amLODipine (NORVASC) 10 MG tablet Take 10 mg by mouth daily.      aspirin 81 MG tablet Take 81 mg by mouth daily. Reported on 11/01/2015     benazepril (LOTENSIN) 40 MG tablet Take 40 mg by mouth every evening.     clopidogrel (PLAVIX) 75 MG tablet Take 1 tablet (75 mg total) by mouth daily. 90 tablet 3   metoprolol (LOPRESSOR) 50 MG tablet Take 50 mg by mouth 2 (two) times daily.   5   pravastatin (PRAVACHOL) 40 MG tablet Take 40 mg by mouth at bedtime.      No current facility-administered medications for this visit.    No Known  Allergies   REVIEW OF SYSTEMS:  [X]  denotes positive finding, [ ]  denotes negative finding Cardiac  Comments:  Chest pain or chest pressure:    Shortness of breath upon exertion:    Short of breath when lying flat:    Irregular heart rhythm:        Vascular    Pain in calf, thigh, or hip brought on by ambulation:    Pain in feet at night that wakes you up from your sleep:     Blood clot in your veins:    Leg swelling:         Pulmonary  Oxygen at home:    Productive cough:     Wheezing:         Neurologic    Sudden weakness in arms or legs:     Sudden numbness in arms or legs:     Sudden onset of difficulty speaking or slurred speech:    Temporary loss of vision in one eye:     Problems with dizziness:         Gastrointestinal    Blood in stool:     Vomited blood:         Genitourinary    Burning when urinating:     Blood in urine:        Psychiatric    Major depression:         Hematologic    Bleeding problems:    Problems with blood clotting too easily:        Skin    Rashes or ulcers:        Constitutional    Fever or chills:      PHYSICAL EXAMINATION:  Vitals:   07/14/21 0939  BP: (!) 163/85  Pulse: (!) 50  Resp: 20  Temp: (!) 97.5 F (36.4 C)  TempSrc: Temporal  SpO2: 100%  Weight: 187 lb 12.8 oz (85.2 kg)  Height: 6\' 2"  (1.88 m)    General:  WDWN in NAD; vital signs documented above Gait: Normal HENT: WNL, normocephalic Pulmonary: normal non-labored breathing , without wheezing Cardiac: regular HR, without  Murmurs, no carotid bruit Abdomen: soft, NT, no masses. Can feel palpable Aortic pulse Vascular Exam/Pulses:  Right Left  Radial 2+ (normal) 2+ (normal)  Femoral 2+ (normal) 2+ (normal)  Popliteal absent absent  DP 2+ (normal) 2+ (normal)  PT 1+ (weak) 1+ (weak)   Extremities: without ischemic changes, without Gangrene , without cellulitis; without open wounds;  Musculoskeletal: no muscle wasting or atrophy  Neurologic: A&O  X 3;  No focal weakness or paresthesias are detected Psychiatric:  The pt has Normal affect.   Non-Invasive Vascular Imaging:    Duplex Findings:  +------------+--------+--------+------+--------------+  Aorta       PSV cm/sEDV cm/sPlaque   Comments     +------------+--------+--------+------+--------------+  Aorta Prox     74                 2.64 x 2.81 cm  +------------+--------+--------+------+--------------+  Aorta Mid     175                 2.96 x 2.98 cm  +------------+--------+--------+------+--------------+  Aorta Distal                      2.83 x 2.93 cm  +------------+--------+--------+------+--------------+   Left iliac artery stents :  CIA prox 141 cm/s  CIA mid 160 cm/s  CIA dist 317 cm/s   EIA prox 130 cm/s  EIA mid 120 cm/s  EIA dist 138 cm/s     +-----------------+--------+--------+-------+  Left Renal ArteryPSV cm/sEDV cm/sComment  +-----------------+--------+--------+-------+  Proximal           415     132            +-----------------+--------+--------+-------+  Mid                426     122            +-----------------+--------+--------+-------+  Distal             193  44            +-----------------+--------+--------+-------+   Summary:  Renal: Left: LRV flow present. Normal size of left kidney (13.5 cm). Evidence of a > 60% stenosis in the left renal artery.     Aorta/iliacs: >50% stenosis in the mid aorta. Plaque throughout the aorta. Ectatic aorta. Measurements above Patent CIA and EIA stents with >50 % stenosis at the distal CIA.   +-------+-----------+-----------+------------+------------+  ABI/TBIToday's ABIToday's TBIPrevious ABIPrevious TBI  +-------+-----------+-----------+------------+------------+  Right  1          0.65       0.94        0.74          +-------+-----------+-----------+------------+------------+  Left   0.95       0.51       0.88        0.71           +-------+-----------+-----------+------------+------------+   VAS [place] Femoral-femoral bypass graft: Summary:  Left: Patent left to right fem-fem bypass with no stenosis seen.   ASSESSMENT/PLAN:: 70 y.o. male here for follow up for peripheral artery disease, RAS as well as AAA. His AAA has been small at 3.3 cm and asymptomatic.  He has not had any symptoms such as rest pain, claudication or tissue loss. He has palpable pedal pulses clinically. His non invasive studies show patent Fem- Fem bypass, stable AAA at 2.98 at greatest diameter. He does have continued evidence of >50 % stenosis in the EIA stent but velocities are essentially unchanged. He additionally has some elevated velocities in his left renal artery stent. This also has not significantly changed from prior study. His ABIs bilaterally are slightly improved. - Will not plan to address his RAS or EIA unless he becomes more symptomatic. - Recommend he try to monitor his blood pressure more regularly at home. Says he does not check blood pressure unless he goes to doctor visit. If there becomes issues with managing his blood pressure on his current regimen his left renal artery stenosis may need addressed - Follow up with PCP regarding blood pressure management - he will continue his Asprin, statin, and Plavix - discussed smoking cessation, but he is not interested in quitting at this time -discussed that if he were to develop any symptoms prior to that visit, to contact us sooner.  I also discussed s/s of rupture AAA and that he would need to call 911 and get to the ER immediately   - He will follow up in 6 months with repeat AAA, RAS duplex, fem- fem duplex and ABIs   Karoline Caldwell, PA-C [facility] [phone]  Clinic MD:   Virl Cagey

## 2021-07-21 ENCOUNTER — Other Ambulatory Visit: Payer: Self-pay

## 2021-07-21 DIAGNOSIS — I701 Atherosclerosis of renal artery: Secondary | ICD-10-CM

## 2021-07-21 DIAGNOSIS — I739 Peripheral vascular disease, unspecified: Secondary | ICD-10-CM

## 2021-09-16 DIAGNOSIS — Z85528 Personal history of other malignant neoplasm of kidney: Secondary | ICD-10-CM | POA: Diagnosis not present

## 2021-10-28 DIAGNOSIS — I2510 Atherosclerotic heart disease of native coronary artery without angina pectoris: Secondary | ICD-10-CM | POA: Diagnosis not present

## 2021-10-28 DIAGNOSIS — I745 Embolism and thrombosis of iliac artery: Secondary | ICD-10-CM | POA: Diagnosis not present

## 2021-10-28 DIAGNOSIS — I700 Atherosclerosis of aorta: Secondary | ICD-10-CM | POA: Diagnosis not present

## 2021-10-28 DIAGNOSIS — Z85528 Personal history of other malignant neoplasm of kidney: Secondary | ICD-10-CM | POA: Diagnosis not present

## 2021-10-28 DIAGNOSIS — J432 Centrilobular emphysema: Secondary | ICD-10-CM | POA: Diagnosis not present

## 2021-12-24 DIAGNOSIS — Z720 Tobacco use: Secondary | ICD-10-CM | POA: Diagnosis not present

## 2021-12-24 DIAGNOSIS — Z905 Acquired absence of kidney: Secondary | ICD-10-CM | POA: Diagnosis not present

## 2021-12-24 DIAGNOSIS — I129 Hypertensive chronic kidney disease with stage 1 through stage 4 chronic kidney disease, or unspecified chronic kidney disease: Secondary | ICD-10-CM | POA: Diagnosis not present

## 2021-12-24 DIAGNOSIS — N183 Chronic kidney disease, stage 3 unspecified: Secondary | ICD-10-CM | POA: Diagnosis not present

## 2021-12-24 DIAGNOSIS — Z8679 Personal history of other diseases of the circulatory system: Secondary | ICD-10-CM | POA: Diagnosis not present

## 2022-02-02 NOTE — Progress Notes (Signed)
[facility] [place] HISTORY AND PHYSICAL   History of Present Illness:  Patient is a 71 y.o. year old male who presents for evaluation of PAD with distant history of right LE claudication.  He has a history of AAA with largest diameter of 3.3.   He has extensive vascular surgery/ intervention history which includes angioplasty and stenting of the left CIA and angioplasty and stenting of the left renal artery on 02/15/2014 by Dr. Bridgett Larsson.  On 04/11/2014, he had left to right femoral to femoral bypass with right femoral endarterectomy with bovine patch angioplasty also by Dr. Bridgett Larsson.  In March 2018, he underwent left CIA drug coated angioplasty as well as left CIA and EIA angioplasty stenting with left renal artery angiogram by Dr. Bridgett Larsson. Most recently underwent Left external iliac artery angioplasty with drug-coated balloon for in-stent restenosis (8 mm x 40 mm drug-coated Lutonix) with Mynx closure of the left common femoral artery in May 2021 by Dr. Carlis Abbott.  He is medically managed on ASA, Plavix and Statin.  Past Medical History:  Diagnosis Date   AAA (abdominal aortic aneurysm) 02/03/2018   3cm fusiform, noted on CT abd    Adrenal myelolipoma 03/07/2018   Small, right 1.9x1.5cm, noted on MRI ABD   Aortic atherosclerosis ([place]) 03/07/2018   Severe, noted on MRI ABD   Cyst of left kidney 03/07/2018   Multiple, noted on MRI ABD   Diverticulosis 02/03/2018   descending ang sigmoid, noted on CT abd    GERD (gastroesophageal reflux disease)    Grade I diastolic dysfunction 81/44/8185   Noted on ECHO    H/O renal artery stenosis    Hyperlipidemia    Hypertension    Intermittent claudication (HCC)    LVH (left ventricular hypertrophy) 04/04/2014   Mild, noted on ECHO   Peripheral arterial disease ([place])    Renal artery stenosis ([place])    Right renal mass 03/07/2018   2.7 cm, noted on MRI ABD   Thrombus of aorta ([place]) 02/03/2018   progressive mural, noted on CT abd    TIA  (transient ischemic attack) 04/2013   went to [facility] for evaluation    Past Surgical History:  Procedure Laterality Date   ABDOMINAL AORTAGRAM N/A 02/15/2014   Procedure: ABDOMINAL Maxcine Ham;  Surgeon: Conrad Attica, MD;  Location: [facility] CATH LAB;  Service: Cardiovascular;  Laterality: N/A;   ABDOMINAL AORTOGRAM N/A 01/18/2020   Procedure: ABDOMINAL AORTOGRAM;  Surgeon: Marty Heck, MD;  Location: [place] CV LAB;  Service: Cardiovascular;  Laterality: N/A;   ABDOMINAL AORTOGRAM W/LOWER EXTREMITY N/A 11/12/2016   Procedure: Abdominal Aortogram w/Lower Extremity;  Surgeon: Conrad Loco, MD;  Location: [place] CV LAB;  Service: Cardiovascular;  Laterality: N/A;   CHOLECYSTECTOMY     COLONOSCOPY     FEMORAL-FEMORAL BYPASS GRAFT Bilateral 04/11/2014   Procedure: BYPASS GRAFT LEFT-RIGHT FEMORAL-FEMORAL ARTERY and bilateral femoral endarterectomies with Bovine Patch Angioplasties.;  Surgeon: Conrad [place]ow, MD;  Location: [place];  Service: Vascular;  Laterality: Bilateral;   LAPAROSCOPIC NEPHRECTOMY Right 05/26/2018   Procedure: LAPAROSCOPIC RADICAL NEPHRECTOMY;  Surgeon: Raynelle Bring, MD;  Location: [facility];  Service: Urology;  Laterality: Right;   PERIPHERAL VASCULAR BALLOON ANGIOPLASTY Left 01/18/2020   Procedure: PERIPHERAL VASCULAR BALLOON ANGIOPLASTY;  Surgeon: Marty Heck, MD;  Location: [place] CV LAB;  Service: Cardiovascular;  Laterality: Left;  external iliac   PERIPHERAL VASCULAR INTERVENTION  11/12/2016   Procedure: Peripheral Vascular Intervention;  Surgeon: Jannette Fogo  Bridgett Larsson, MD;  Location: [place] CV LAB;  Service: Cardiovascular;;   RENAL ARTERY STENT Left 2015   VASCULAR SURGERY      ROS:   General:  No weight loss, Fever, chills  HEENT: No recent headaches, no nasal bleeding, no visual changes, no sore throat  Neurologic: No dizziness, blackouts, seizures. No recent symptoms of stroke or mini- stroke. No recent episodes of slurred speech, or temporary  blindness.  Cardiac: No recent episodes of chest pain/pressure, no shortness of breath at rest.  No shortness of breath with exertion.  Denies history of atrial fibrillation or irregular heartbeat  Vascular: No history of rest pain in feet.  No history of claudication.  No history of non-healing ulcer, No history of DVT   Pulmonary: No home oxygen, no productive cough, no hemoptysis,  No asthma or wheezing  Musculoskeletal:  '[ ]'$  Arthritis, '[ ]'$  Low back pain,  '[ ]'$  Joint pain  Hematologic:No history of hypercoagulable state.  No history of easy bleeding.  No history of anemia  Gastrointestinal: No hematochezia or melena,  No gastroesophageal reflux, no trouble swallowing  Urinary: '[ ]'$  chronic Kidney disease, '[ ]'$  on HD - '[ ]'$  MWF or '[ ]'$  TTHS, '[ ]'$  Burning with urination, '[ ]'$  Frequent urination, '[ ]'$  Difficulty urinating;   Skin: No rashes  Psychological: No history of anxiety,  No history of depression  Social History Social History   Tobacco Use   Smoking status: Every Day    Packs/day: 1.50    Years: 53.00    Total pack years: 79.50    Types: Cigarettes   Smokeless tobacco: Never  Vaping Use   Vaping Use: Never used  Substance Use Topics   Alcohol use: No    Alcohol/week: 0.0 standard drinks of alcohol   Drug use: No    Family History Family History  Problem Relation Age of Onset   Diabetes Mother    Hypertension Brother    Heart attack Brother     Allergies  No Known Allergies   Current Outpatient Medications  Medication Sig Dispense Refill   amLODipine (NORVASC) 10 MG tablet Take 10 mg by mouth daily.      aspirin 81 MG tablet Take 81 mg by mouth daily. Reported on 11/01/2015     benazepril (LOTENSIN) 40 MG tablet Take 40 mg by mouth every evening.     clopidogrel (PLAVIX) 75 MG tablet Take 1 tablet (75 mg total) by mouth daily. 90 tablet 3   metoprolol (LOPRESSOR) 50 MG tablet Take 50 mg by mouth 2 (two) times daily.   5   pravastatin (PRAVACHOL) 40 MG tablet  Take 40 mg by mouth at bedtime.      No current facility-administered medications for this visit.    Physical Examination  There were no vitals filed for this visit.  There is no height or weight on file to calculate BMI.  General:  Alert and oriented, no acute distress HEENT: Normal Neck: No bruit or JVD Pulmonary: Clear to auscultation bilaterally Cardiac: Regular Rate and Rhythm without murmur Abdomen: Soft, non-tender, non-distended, no mass, no scars Skin: No rash Extremity Pulses:  2+ radial, brachial, femoral, dorsalis pedis, posterior tibial pulses bilaterally Musculoskeletal: No deformity or edema  Neurologic: Upper and lower extremity motor 5/5 and symmetric  DATA:     Duplex Findings:  +------------+--------+--------+------+-----------+  Aorta       PSV cm/sEDV cm/sPlaque Comments    +------------+--------+--------+------+-----------+  Aorta Prox  49                 2.45 x 2.54  +------------+--------+--------+------+-----------+  Aorta Mid      72                 2.98 x 2.98  +------------+--------+--------+------+-----------+  Aorta Distal   71                 2.33 x 2.48  +------------+--------+--------+------+-----------+              +-----------------+--------+--------+-------+  Left Renal ArteryPSV cm/sEDV cm/sComment  +-----------------+--------+--------+-------+  Origin             238                    +-----------------+--------+--------+-------+  Proximal          > 270                   +-----------------+--------+--------+-------+  Mid               > 274                   +-----------------+--------+--------+-------+  Distal             115                    +-----------------+--------+--------+-------+      Technologist observations: Patent renal vein   +------------+--------+--------+--+-----------+--------+--------+---+  Right KidneyPSV cm/sEDV cm/sRILeft KidneyPSV cm/sEDV  cm/sRI   +------------+--------+--------+--+-----------+--------+--------+---+  Upper Pole                    Upper Pole                      +------------+--------+--------+--+-----------+--------+--------+---+  Mid                           Mid                             +------------+--------+--------+--+-----------+--------+--------+---+  Lower Pole                    Lower Pole                      +------------+--------+--------+--+-----------+--------+--------+---+  Hilar                         Hilar                           +------------+--------+--------+--+-----------+--------+--------+---+   +------------------++------------------+-------+  Right Kidney      Left Kidney                +------------------++------------------+-------+  RAR               RAR                        +------------------++------------------+-------+  RAR (manual)      RAR (manual)               +------------------++------------------+-------+  Cortex            Cortex                     +------------------++------------------+-------+  Cortex thickness  Corex thickness   1.25 mm  +------------------++------------------+-------+  Kidney length (cm)Kidney length (cm)12.70    +------------------++------------------+-------+      Summary:  Renal:      Normal size of left kidney. Evidence of a > 60% stenosis in the   left renal artery.     ABI Findings:  +---------+------------------+-----+--------+--------+  Right    Rt Pressure (mmHg)IndexWaveformComment   +---------+------------------+-----+--------+--------+  Brachial 174                                      +---------+------------------+-----+--------+--------+  PTA      174               1.00 biphasic          +---------+------------------+-----+--------+--------+  DP       176               1.01 biphasic           +---------+------------------+-----+--------+--------+  Great Toe127               0.73 Normal            +---------+------------------+-----+--------+--------+   +---------+------------------+-----+--------+-------+  Left     Lt Pressure (mmHg)IndexWaveformComment  +---------+------------------+-----+--------+-------+  Brachial 171                                     +---------+------------------+-----+--------+-------+  PTA      165               0.95 biphasic         +---------+------------------+-----+--------+-------+  DP       171               0.98 biphasic         +---------+------------------+-----+--------+-------+  Great Toe120               0.69                  +---------+------------------+-----+--------+-------+   +-------+-----------+-----------+------------+------------+  ABI/TBIToday's ABIToday's TBIPrevious ABIPrevious TBI  +-------+-----------+-----------+------------+------------+  Right  1.01       0.73       1.0         0.65          +-------+-----------+-----------+------------+------------+  Left   0.98       0.69       0.95        0.51          +-------+-----------+-----------+------------+------------+        Summary:  Right: Resting right ankle-brachial index is within normal range. No  evidence of significant right lower extremity arterial disease. The right  toe-brachial index is normal.   Left: Resting left ankle-brachial index is within normal range. No  evidence of significant left lower extremity arterial disease. The left  toe-brachial index is abnormal.   Fem Fem Graft:  +------------------+--------+--------+--------+--------+                    PSV cm/sStenosisWaveformComments  +------------------+--------+--------+--------+--------+  Inflow            52              biphasic          +------------------+--------+--------+--------+--------+  Prox anastomosis  218  biphasic          +------------------+--------+--------+--------+--------+  Proximal graft    60              biphasic          +------------------+--------+--------+--------+--------+  Mid graft         47              biphasic          +------------------+--------+--------+--------+--------+  Distal graft      47              biphasic          +------------------+--------+--------+--------+--------+  Distal anastomosis60              biphasic          +------------------+--------+--------+--------+--------+  Outflow           37              biphasic          +------------------+--------+--------+--------+--------+     Summary:  Patent left to right femoral to femoral bypass with no visualized  stenosis.   ASSESSMENT/PLAN:  PAD asymptomatic of claudication on flat ground.  He does develop calf pain with inclines.  He denies non healing wounds.  He has patent fem-fem bypass with palpable pulse in bypass on exam.  His ABI have biphasic wave forms and has palpable pedal pulses B.  The renal duplex demonstrates 60%  Stenosis.    His BP has improved with PO medication as well as stenting.  He will f/u for repeat studies in 6 months.  If he develops problems or concerns he will call sooner.  I have recommended he stop smoking and stay active.  Cont daily ASA and Statin for maximum medical therapy.    Roxy Horseman PA-C [facility]  [phone]  MD in clinic [facility]

## 2022-02-03 ENCOUNTER — Ambulatory Visit (INDEPENDENT_AMBULATORY_CARE_PROVIDER_SITE_OTHER)
Admission: RE | Admit: 2022-02-03 | Discharge: 2022-02-03 | Disposition: A | Discharge status: Home / Self Care | Payer: Medicare Other | Source: Ambulatory Visit | Attending: Vascular Surgery | Admitting: Vascular Surgery

## 2022-02-03 ENCOUNTER — Ambulatory Visit (INDEPENDENT_AMBULATORY_CARE_PROVIDER_SITE_OTHER): Payer: Medicare Other | Admitting: Physician Assistant

## 2022-02-03 ENCOUNTER — Ambulatory Visit (HOSPITAL_COMMUNITY)
Admission: RE | Admit: 2022-02-03 | Discharge: 2022-02-03 | Disposition: A | Discharge status: Home / Self Care | Payer: Medicare Other | Source: Ambulatory Visit | Attending: Vascular Surgery | Admitting: Vascular Surgery

## 2022-02-03 VITALS — BP 142/77 | HR 66 | Temp 98.0°F | Resp 18 | Ht 74.0 in | Wt 184.6 lb

## 2022-02-03 DIAGNOSIS — I701 Atherosclerosis of renal artery: Secondary | ICD-10-CM | POA: Insufficient documentation

## 2022-02-03 DIAGNOSIS — I739 Peripheral vascular disease, unspecified: Secondary | ICD-10-CM

## 2022-02-05 ENCOUNTER — Other Ambulatory Visit: Payer: Self-pay | Admitting: *Deleted

## 2022-02-05 DIAGNOSIS — I745 Embolism and thrombosis of iliac artery: Secondary | ICD-10-CM

## 2022-02-05 DIAGNOSIS — I70211 Atherosclerosis of native arteries of extremities with intermittent claudication, right leg: Secondary | ICD-10-CM

## 2022-02-05 DIAGNOSIS — I701 Atherosclerosis of renal artery: Secondary | ICD-10-CM

## 2022-02-05 DIAGNOSIS — I739 Peripheral vascular disease, unspecified: Secondary | ICD-10-CM

## 2022-02-23 ENCOUNTER — Other Ambulatory Visit: Payer: Self-pay | Admitting: Vascular Surgery

## 2022-02-23 DIAGNOSIS — I739 Peripheral vascular disease, unspecified: Secondary | ICD-10-CM

## 2022-03-31 DIAGNOSIS — Z720 Tobacco use: Secondary | ICD-10-CM | POA: Diagnosis not present

## 2022-03-31 DIAGNOSIS — Z8679 Personal history of other diseases of the circulatory system: Secondary | ICD-10-CM | POA: Diagnosis not present

## 2022-03-31 DIAGNOSIS — Z905 Acquired absence of kidney: Secondary | ICD-10-CM | POA: Diagnosis not present

## 2022-03-31 DIAGNOSIS — I129 Hypertensive chronic kidney disease with stage 1 through stage 4 chronic kidney disease, or unspecified chronic kidney disease: Secondary | ICD-10-CM | POA: Diagnosis not present

## 2022-03-31 DIAGNOSIS — Z0000 Encounter for general adult medical examination without abnormal findings: Secondary | ICD-10-CM | POA: Diagnosis not present

## 2022-03-31 DIAGNOSIS — N183 Chronic kidney disease, stage 3 unspecified: Secondary | ICD-10-CM | POA: Diagnosis not present

## 2022-05-19 ENCOUNTER — Other Ambulatory Visit: Payer: Self-pay | Admitting: Vascular Surgery

## 2022-05-19 DIAGNOSIS — I739 Peripheral vascular disease, unspecified: Secondary | ICD-10-CM

## 2022-08-24 ENCOUNTER — Other Ambulatory Visit: Payer: Self-pay | Admitting: Vascular Surgery

## 2022-08-24 DIAGNOSIS — I739 Peripheral vascular disease, unspecified: Secondary | ICD-10-CM

## 2022-08-25 ENCOUNTER — Telehealth: Payer: Self-pay

## 2022-08-25 NOTE — Telephone Encounter (Signed)
Refill request sent electronically. After review of pt's chart, pt has appts in recall that need to be scheduled.  Called pt's primary home number, no answer, vm full.  Called pt's cell number, no answer, lf vm requesting return call to schedule appts so refill can be sent.

## 2022-08-26 ENCOUNTER — Other Ambulatory Visit: Payer: Self-pay

## 2022-08-26 DIAGNOSIS — I739 Peripheral vascular disease, unspecified: Secondary | ICD-10-CM

## 2022-08-26 MED ORDER — CLOPIDOGREL BISULFATE 75 MG PO TABS: 75.0000 mg | ORAL_TABLET | Freq: Every day | ORAL | 0 refills | Status: DC

## 2022-09-08 DIAGNOSIS — N289 Disorder of kidney and ureter, unspecified: Secondary | ICD-10-CM | POA: Diagnosis not present

## 2022-09-08 DIAGNOSIS — J432 Centrilobular emphysema: Secondary | ICD-10-CM | POA: Diagnosis not present

## 2022-09-08 DIAGNOSIS — D1771 Benign lipomatous neoplasm of kidney: Secondary | ICD-10-CM | POA: Diagnosis not present

## 2022-09-08 DIAGNOSIS — I700 Atherosclerosis of aorta: Secondary | ICD-10-CM | POA: Diagnosis not present

## 2022-09-08 DIAGNOSIS — K5730 Diverticulosis of large intestine without perforation or abscess without bleeding: Secondary | ICD-10-CM | POA: Diagnosis not present

## 2022-09-08 DIAGNOSIS — Z85528 Personal history of other malignant neoplasm of kidney: Secondary | ICD-10-CM | POA: Diagnosis not present

## 2022-09-08 DIAGNOSIS — C641 Malignant neoplasm of right kidney, except renal pelvis: Secondary | ICD-10-CM | POA: Diagnosis not present

## 2022-09-15 ENCOUNTER — Encounter: Payer: Self-pay | Admitting: Vascular Surgery

## 2022-09-15 ENCOUNTER — Ambulatory Visit (HOSPITAL_COMMUNITY)
Admission: RE | Admit: 2022-09-15 | Discharge: 2022-09-15 | Disposition: A | Discharge status: Home / Self Care | Payer: Medicare Other | Source: Ambulatory Visit | Attending: Vascular Surgery | Admitting: Vascular Surgery

## 2022-09-15 ENCOUNTER — Ambulatory Visit (INDEPENDENT_AMBULATORY_CARE_PROVIDER_SITE_OTHER)
Admission: RE | Admit: 2022-09-15 | Discharge: 2022-09-15 | Disposition: A | Discharge status: Home / Self Care | Payer: Medicare Other | Source: Ambulatory Visit | Attending: Physician Assistant | Admitting: Physician Assistant

## 2022-09-15 ENCOUNTER — Other Ambulatory Visit: Payer: Self-pay

## 2022-09-15 ENCOUNTER — Ambulatory Visit (INDEPENDENT_AMBULATORY_CARE_PROVIDER_SITE_OTHER): Payer: Medicare Other | Admitting: Vascular Surgery

## 2022-09-15 VITALS — BP 168/88 | HR 54 | Temp 97.3°F | Resp 16 | Ht 74.0 in | Wt 182.0 lb

## 2022-09-15 DIAGNOSIS — I70211 Atherosclerosis of native arteries of extremities with intermittent claudication, right leg: Secondary | ICD-10-CM

## 2022-09-15 DIAGNOSIS — I701 Atherosclerosis of renal artery: Secondary | ICD-10-CM

## 2022-09-15 DIAGNOSIS — I739 Peripheral vascular disease, unspecified: Secondary | ICD-10-CM

## 2022-09-15 DIAGNOSIS — I745 Embolism and thrombosis of iliac artery: Secondary | ICD-10-CM

## 2022-09-15 DIAGNOSIS — I714 Abdominal aortic aneurysm, without rupture, unspecified: Secondary | ICD-10-CM

## 2022-09-15 LAB — VAS US ABI WITH/WO TBI
Left ABI: 0.84
Right ABI: 0.89

## 2022-09-15 NOTE — Progress Notes (Signed)
Patient name: Jeffrey Stout MRN: [id number] DOB: Feb 22, 1951 Sex: male  REASON FOR VISIT: 62-monthfollow-up with surveillance left renal artery stent, left iliac stents, fem fem bypass  HPI: CTobby Fawcettis a 72y.o. male with hx HTN, HLD, tobacco abuse that presents for 6 month follow-up and surveillance.  He had a previous left renal artery stent by Dr. CBridgett Larsson(hx right nephrectomy as well).  He later had left common iliac stent by Dr. CBridgett Larssonon 02/15/2014 for intermittent claudication.  Ultimately at a later date he underwent a left to right femoral-femoral bypass 04/11/2014 also for claudication.  Then he had a drug-coated balloon angioplasty of the left common iliac stent and extension of the stent into the external iliac artery on 11/12/2016 all by Dr. CBridgett Larsson  I have since performed DCB of the left external iliac artery.  He still smoking a pack a day.    Past Medical History:  Diagnosis Date   AAA (abdominal aortic [place]) [place] 02/03/2018   3cm fusiform, noted on CT abd    Adrenal myelolipoma 03/07/2018   Small, right 1.9x1.5cm, noted on MRI ABD   Aortic atherosclerosis ([place] 03/07/2018   Severe, noted on MRI ABD   Cyst of left kidney 03/07/2018   Multiple, noted on MRI ABD   Diverticulosis 02/03/2018   descending ang sigmoid, noted on CT abd    GERD (gastroesophageal reflux disease)    Grade I diastolic dysfunction 062/83/6629  Noted on ECHO    H/O renal artery stenosis    Hyperlipidemia    Hypertension    Intermittent claudication (HCC)    LVH (left ventricular hypertrophy) 04/04/2014   Mild, noted on ECHO   Peripheral arterial disease ([place]    [place]al artery stenosis ([place]    Right renal mass 03/07/2018   2.7 cm, noted on MRI ABD   Thrombus of aorta ([place] 02/03/2018   progressive mural, noted on CT abd    T[facility] (transient ischemic attack) 04/2013   [facility]    Past Surgical History:  Procedure Laterality Date   ABDOMINAL AORTAGRAM N/A 02/15/2014    Procedure: ABDOMINAL AMaxcine Ham  Surgeon: BConrad [place] MD;  [facility];  Service: Cardiovascular;  Laterality: N/A;   ABDOMINAL AORTOGRAM N/A 01/18/2020   Procedure: ABDOMINAL AORTOGRAM;  Surgeon: CMarty Heck MD;  [facility];  Service: Cardiovascular;  Laterality: N/A;   ABDOMINAL AORTOGRAM W/LOWER EXTREMITY N/A 11/12/2016   Procedure: Abdominal Aortogram w/Lower Extremity;  Surgeon: BConrad Phelps MD;  [facility];  Service: Cardiovascular;  Laterality: N/A;   CHOLECYSTECTOMY     COLONOSCOPY     FEMORAL-FEMORAL BYPASS GRAFT Bilateral 04/11/2014   Procedure: BYPASS GRAFT LEFT-RIGHT FEMORAL-FEMORAL ARTERY and bilateral femoral endarterectomies with Bovine Patch Angioplasties.;  Surgeon: BConrad [place] MD;  [place]  Service: Vascular;  Laterality: Bilateral;   LAPAROSCOPIC NEPHRECTOMY Right 05/26/2018   Procedure: LAPAROSCOPIC RADICAL NEPHRECTOMY;  Surgeon: BRaynelle Bring MD;  [facility];  Service: Urology;  Laterality: Right;   PERIPHERAL VASCULAR BALLOON ANGIOPLASTY Left 01/18/2020   Procedure: PERIPHERAL VASCULAR BALLOON ANGIOPLASTY;  Surgeon: CMarty Heck MD;  [place];  Service: Cardiovascular;  Laterality: Left;  external iliac   PERIPHERAL VASCULAR INTERVENTION  11/12/2016   Procedure: Peripheral Vascular Intervention;  Surgeon: BConrad [place] MD;  [place];  Service: Cardiovascular;;   RENAL ARTERY STENT Left 2015   VASCULAR SURGERY  Family History  Problem Relation Age of Onset   Diabetes Mother    Hypertension Brother    Heart attack Brother     SOCIAL HISTORY: Social History   Tobacco Use   Smoking status: Every Day    Packs/day: 1.50    Years: 53.00    Total pack years: 79.50    Types: Cigarettes   Smokeless tobacco: Never  Substance Use Topics   Alcohol use: No    Alcohol/week: 0.0 standard drinks of alcohol    No Known Allergies  Current  Outpatient Medications  Medication Sig Dispense Refill   amLODipine (NORVASC) 10 MG tablet Take 10 mg by mouth daily.      aspirin 81 MG tablet Take 81 mg by mouth daily. Reported on 11/01/2015     benazepril (LOTENSIN) 40 MG tablet Take 40 mg by mouth every evening.     clopidogrel (PLAVIX) 75 MG tablet Take 1 tablet (75 mg total) by mouth daily. 90 tablet 0   metoprolol (LOPRESSOR) 50 MG tablet Take 50 mg by mouth 2 (two) times daily.   5   pravastatin (PRAVACHOL) 40 MG tablet Take 40 mg by mouth at bedtime.      clopidogrel (PLAVIX) 75 MG tablet Take 1 tablet by mouth once daily (Patient not taking: Reported on 09/15/2022) 90 tablet 0   No current facility-administered medications for this visit.    REVIEW OF SYSTEMS:  '[X]'$  denotes positive finding, '[ ]'$  denotes negative finding Cardiac  Comments:  Chest pain or chest pressure:    Shortness of breath upon exertion:    Short of breath when lying flat:    Irregular heart rhythm:        Vascular    Pain in calf, thigh, or hip brought on by ambulation:    Pain in feet at night that wakes you up from your sleep:     Blood clot in your veins:    Leg swelling:         Pulmonary    Oxygen at home:    Productive cough:     Wheezing:         Neurologic    Sudden weakness in arms or legs:     Sudden numbness in arms or legs:     Sudden onset of difficulty speaking or slurred speech:    Temporary loss of vision in one eye:     Problems with dizziness:         Gastrointestinal    Blood in stool:     Vomited blood:         Genitourinary    Burning when urinating:     Blood in urine:        Psychiatric    Major depression:         Hematologic    Bleeding problems:    Problems with blood clotting too easily:        Skin    Rashes or ulcers:        Constitutional    Fever or chills:      PHYSICAL EXAM: Vitals:   09/15/22 1053  BP: (!) 168/88  Pulse: (!) 54  Resp: 16  Temp: (!) 97.3 F (36.3 C)  TempSrc: Temporal   SpO2: 96%  Weight: 182 lb (82.6 kg)  Height: '6\' 2"'$  (1.88 m)    GENERAL: The patient is a well-nourished male, in no acute distress. The vital signs are documented above. CARDIAC: There is a regular rate  and rhythm.  VASCULAR:  2+ palpable bilateral femoral pulses, pulse in fem fem graft Palpable DP pulse bilaterally No lower extremity tissue loss PULMONARY: No respiratory distress. ABDOMEN: Soft and non-tender. MUSCULOSKELETAL: There are no major deformities or cyanosis. NEUROLOGIC: No focal weakness or paresthesias are detected. SKIN: There are no ulcers or rashes noted.   DATA:   Renal artery duplex today suggest left renal artery stenosis over 60% in the midportion of the artery with a velocity of 424/92 and a RAR ratio of 7.4.  Femorofemoral bypass is widely patent by duplex today.  ABI's today are 0.89 on the right multiphasic and 0.84 on the left multiphasic  Assessment/Plan:  72 year old male that presents for 50-monthinterval surveillance of his left renal artery stent as well as his left iliac artery stents and left to right femorofemoral bypass.  Discussed I am concerned that his renal artery duplex today shows his left renal artery has a recurrent high-grade stenosis.  He had a previous right nephrectomy and the stented left renal artery is his only functioning kidney.  I have recommended aortogram with a focus on the left renal artery with renal artery arteriogram with possible intervention to maintain long-term patency of his stent.  I did review a recent CT scan obtained by his urologist and his left iliac stents look patent and today's duplex suggest this femorofemoral bypass is widely patent as well without significant stenosis.  I could also review his femorofemoral bypass on CT scan and I do not see any significant disease.  He has known mural thrombus in his aorta particularly in the infrarenal aorta.  He has no evidenece of aneurysm on the CT scan in the abdominal  aorta.  CMarty Heck MD [facility] [place]: [place]

## 2022-09-16 DIAGNOSIS — Z85528 Personal history of other malignant neoplasm of kidney: Secondary | ICD-10-CM | POA: Diagnosis not present

## 2022-09-17 ENCOUNTER — Ambulatory Visit (HOSPITAL_COMMUNITY)
Admission: RE | Admit: 2022-09-17 | Discharge: 2022-09-17 | Disposition: A | Discharge status: Home / Self Care | Payer: Medicare Other | Attending: Vascular Surgery | Admitting: Vascular Surgery

## 2022-09-17 NOTE — Progress Notes (Signed)
Attempted again to reach patient.  Not able to get patient on the phone.

## 2022-09-17 NOTE — Progress Notes (Signed)
Dr Carlis Abbott has an emergency, not going to be able to do procedure today.  Attempted both contact # in EPIC to let patient know.  1 # unable to leave a voicemail, mailbox full.  Left message on 2nd #.  Will try again.

## 2022-10-01 ENCOUNTER — Other Ambulatory Visit: Payer: Self-pay

## 2022-10-01 ENCOUNTER — Ambulatory Visit (HOSPITAL_COMMUNITY)
Admission: RE | Admit: 2022-10-01 | Discharge: 2022-10-01 | Disposition: A | Discharge status: Home / Self Care | Payer: Medicare Other | Attending: Vascular Surgery | Admitting: Vascular Surgery

## 2022-10-01 ENCOUNTER — Encounter (HOSPITAL_COMMUNITY)
Admission: RE | Disposition: A | Discharge status: Home / Self Care | Payer: Self-pay | Source: Home / Self Care | Attending: Vascular Surgery

## 2022-10-01 DIAGNOSIS — I129 Hypertensive chronic kidney disease with stage 1 through stage 4 chronic kidney disease, or unspecified chronic kidney disease: Secondary | ICD-10-CM | POA: Diagnosis not present

## 2022-10-01 DIAGNOSIS — T82856A Stenosis of peripheral vascular stent, initial encounter: Secondary | ICD-10-CM | POA: Diagnosis not present

## 2022-10-01 DIAGNOSIS — Z905 Acquired absence of kidney: Secondary | ICD-10-CM | POA: Diagnosis not present

## 2022-10-01 DIAGNOSIS — E785 Hyperlipidemia, unspecified: Secondary | ICD-10-CM | POA: Insufficient documentation

## 2022-10-01 DIAGNOSIS — F17210 Nicotine dependence, cigarettes, uncomplicated: Secondary | ICD-10-CM | POA: Diagnosis not present

## 2022-10-01 DIAGNOSIS — I701 Atherosclerosis of renal artery: Secondary | ICD-10-CM | POA: Insufficient documentation

## 2022-10-01 DIAGNOSIS — N189 Chronic kidney disease, unspecified: Secondary | ICD-10-CM | POA: Diagnosis not present

## 2022-10-01 DIAGNOSIS — Y832 Surgical operation with anastomosis, bypass or graft as the cause of abnormal reaction of the patient, or of later complication, without mention of misadventure at the time of the procedure: Secondary | ICD-10-CM | POA: Diagnosis not present

## 2022-10-01 HISTORY — PX: RENAL ANGIOGRAPHY: CATH118260

## 2022-10-01 HISTORY — PX: PERIPHERAL VASCULAR INTERVENTION: CATH118257

## 2022-10-01 LAB — POCT I-STAT, CHEM 8
BUN: 21 mg/dL (ref 8–23)
Calcium, Ion: 1.17 mmol/L (ref 1.15–1.40)
Chloride: 104 mmol/L (ref 98–111)
Creatinine, Ser: 1.5 mg/dL — ABNORMAL HIGH (ref 0.61–1.24)
Glucose, Bld: 152 mg/dL — ABNORMAL HIGH (ref 70–99)
HCT: 51 % (ref 39.0–52.0)
Hemoglobin: 17.3 g/dL — ABNORMAL HIGH (ref 13.0–17.0)
Potassium: 3.8 mmol/L (ref 3.5–5.1)
Sodium: 141 mmol/L (ref 135–145)
TCO2: 26 mmol/L (ref 22–32)

## 2022-10-01 LAB — POCT ACTIVATED CLOTTING TIME
Activated Clotting Time: 228 seconds
Activated Clotting Time: 168 seconds
Activated Clotting Time: 190 seconds

## 2022-10-01 SURGERY — RENAL ANGIOGRAPHY
Anesthesia: LOCAL | Laterality: Left

## 2022-10-01 MED ORDER — HYDRALAZINE HCL 20 MG/ML IJ SOLN: 5.0000 mg | INTRAMUSCULAR | Status: DC | PRN

## 2022-10-01 MED ORDER — FENTANYL CITRATE (PF) 100 MCG/2ML IJ SOLN
INTRAMUSCULAR | Status: AC
  Filled 2022-10-01: qty 2

## 2022-10-01 MED ORDER — LIDOCAINE HCL (PF) 1 % IJ SOLN
INTRAMUSCULAR | Status: AC
  Filled 2022-10-01: qty 30

## 2022-10-01 MED ORDER — SODIUM CHLORIDE 0.9 % IV SOLN: 250.0000 mL | INTRAVENOUS | Status: DC | PRN

## 2022-10-01 MED ORDER — LIDOCAINE HCL (PF) 1 % IJ SOLN
INTRAMUSCULAR | Status: DC | PRN
  Administered 2022-10-01: 12 mL

## 2022-10-01 MED ORDER — HEPARIN (PORCINE) IN NACL 1000-0.9 UT/500ML-% IV SOLN
INTRAVENOUS | Status: DC | PRN
  Administered 2022-10-01 (×2): 500 mL

## 2022-10-01 MED ORDER — IODIXANOL 320 MG/ML IV SOLN
INTRAVENOUS | Status: DC | PRN
  Administered 2022-10-01: 40 mL via INTRA_ARTERIAL

## 2022-10-01 MED ORDER — SODIUM CHLORIDE 0.9 % IV SOLN: INTRAVENOUS | Status: AC

## 2022-10-01 MED ORDER — LABETALOL HCL 5 MG/ML IV SOLN: 10.0000 mg | INTRAVENOUS | Status: DC | PRN

## 2022-10-01 MED ORDER — HEPARIN (PORCINE) IN NACL 1000-0.9 UT/500ML-% IV SOLN
INTRAVENOUS | Status: AC
  Filled 2022-10-01: qty 1000

## 2022-10-01 MED ORDER — HEPARIN SODIUM (PORCINE) 1000 UNIT/ML IJ SOLN
INTRAMUSCULAR | Status: AC
  Filled 2022-10-01: qty 10

## 2022-10-01 MED ORDER — HEPARIN SODIUM (PORCINE) 1000 UNIT/ML IJ SOLN
INTRAMUSCULAR | Status: DC | PRN
  Administered 2022-10-01: 8000 [IU] via INTRAVENOUS

## 2022-10-01 MED ORDER — SODIUM CHLORIDE 0.9% FLUSH: 3.0000 mL | Freq: Two times a day (BID) | INTRAVENOUS | Status: DC

## 2022-10-01 MED ORDER — MIDAZOLAM HCL 5 MG/5ML IJ SOLN
INTRAMUSCULAR | Status: AC
  Filled 2022-10-01: qty 5

## 2022-10-01 MED ORDER — SODIUM CHLORIDE 0.9 % IV SOLN: INTRAVENOUS | Status: DC

## 2022-10-01 MED ORDER — MIDAZOLAM HCL 5 MG/5ML IJ SOLN
INTRAMUSCULAR | Status: DC | PRN
  Administered 2022-10-01: 1 mg via INTRAVENOUS

## 2022-10-01 MED ORDER — SODIUM CHLORIDE 0.9% FLUSH: 3.0000 mL | INTRAVENOUS | Status: DC | PRN

## 2022-10-01 MED ORDER — ACETAMINOPHEN 325 MG PO TABS: 650.0000 mg | ORAL_TABLET | ORAL | Status: DC | PRN

## 2022-10-01 MED ORDER — MIDAZOLAM HCL 2 MG/2ML IJ SOLN: INTRAMUSCULAR | Status: DC | PRN

## 2022-10-01 MED ORDER — ONDANSETRON HCL 4 MG/2ML IJ SOLN: 4.0000 mg | Freq: Four times a day (QID) | INTRAMUSCULAR | Status: DC | PRN

## 2022-10-01 MED ORDER — FENTANYL CITRATE (PF) 100 MCG/2ML IJ SOLN
INTRAMUSCULAR | Status: DC | PRN
  Administered 2022-10-01: 25 ug via INTRAVENOUS

## 2022-10-01 SURGICAL SUPPLY — 20 items
CATH CROSS OVER TEMPO 5F (CATHETERS) IMPLANT
CATH OMNI FLUSH 5F 65CM (CATHETERS) IMPLANT
CATH SOFT-VU ST 4F 90CM (CATHETERS) IMPLANT
KIT ENCORE 26 ADVANTAGE (KITS) IMPLANT
KIT MICROPUNCTURE NIT STIFF (SHEATH) IMPLANT
KIT PV (KITS) ×2 IMPLANT
SHEATH CATAPULT 7FR 60 (SHEATH) IMPLANT
SHEATH PINNACLE 5F 10CM (SHEATH) IMPLANT
SHEATH PINNACLE 6F 10CM (SHEATH) IMPLANT
SHEATH PINNACLE 7F 10CM (SHEATH) IMPLANT
SHEATH PROBE COVER 6X72 (BAG) IMPLANT
STENT VIABAHN VBX 6X15 (Permanent Stent) IMPLANT
STOPCOCK MORSE 400PSI 3WAY (MISCELLANEOUS) IMPLANT
SYR MEDRAD MARK 7 150ML (SYRINGE) ×2 IMPLANT
TRANSDUCER W/STOPCOCK (MISCELLANEOUS) ×2 IMPLANT
TRAY PV CATH (CUSTOM PROCEDURE TRAY) ×2 IMPLANT
TUBING CIL FLEX 10 FLL-RA (TUBING) IMPLANT
WIRE BENTSON .035X145CM (WIRE) IMPLANT
WIRE ROSEN-J .035X260CM (WIRE) IMPLANT
WIRE SPARTACORE .014X190CM (WIRE) IMPLANT

## 2022-10-01 NOTE — Op Note (Signed)
Patient name: Jeffrey Stout MRN: [id number] DOB: 1951/05/03 Sex: male  10/01/2022 Pre-operative Diagnosis: High-grade stenosis left renal artery stent in the setting of chronic kidney disease Post-operative diagnosis:  Same Surgeon:  Marty Heck, MD Procedure Performed: 1.  Ultrasound-guided access left common femoral artery 2.  Renal artery arteriogram including catheter selection of aorta 3.  Angioplasty and stent of left renal artery (6 mm x 15 mm VBX) 4.  35 minutes of monitored moderate conscious sedation time  Indications: Patient is a 72 year old male who previously had a right nephrectomy and currently has only one kidney on the left.  The left renal artery has previously been stented by Dr. Bridgett Larsson with a 6 mm x 12 mm iCAST in 2015.  Recent surveillance showed high-grade stenosis in his renal artery stent with a velocity 424 consistent with >60% stenosis.  He presents today for renal artery arteriogram and possible intervention after risks and benefits discussed.  Findings:   Ultrasound-guided access left common femoral artery.  Renal artery arteriogram with a catheter in the aorta showed significant haziness in the proximal left renal artery stent.  I was able to cannulate this with an IM catheter and we got a pullback pressure showing a 35 mm Hg systolic pressure difference within the left renal artery stent into the aorta.  I then treated the left renal artery stenosis with a 6 mm x 15 mm VBX.  Excellent results.  Widely patent stent.   Procedure:  The patient was identified in the holding area and taken to room 8.  The patient was then placed supine on the table and prepped and draped in the usual sterile fashion.  A time out was called.  The patient received Versed and fentanyl for moderate sedation.  Vital signs were monitored including heart rate, respiratory rate, oxygenation and blood pressure.  I was present for all of conscious moderate sedation.  Ultrasound was used to  evaluate the left common femoral artery.  It was patent .  A digital ultrasound image was acquired.  A micropuncture needle was used to access the left common femoral artery under ultrasound guidance.  An 018 wire was advanced without resistance and a micropuncture sheath was placed.  The 018 wire was removed and a benson wire was placed.  The micropuncture sheath was exchanged for a 6 french sheath.  An omniflush catheter was advanced over the wire to the level of L-1.  An abdominal angiogram with a focus on the left renal artery was performed.  There was haziness in the left renal artery stent that correlated with his duplex showing high-grade stenosis.  I then used an IM catheter with a Bentson wire and was able to cannulate the existing left renal stent to get my wire out into the renal artery.  I then used the IM catheter with a 014 wire to perform a pullback pressure.  There was a 35 mm Hg systeolic pressure difference within the existing stent correlating with a high-grade flow limiting stenosis.  I then exchanged for a Rosen wire in the left renal artery.  Patient was given 100 units/kg IV heparin.  I then used a 7 [place] long sheath in the left common femoral artery that was driven across the existing renal stent into the mid renal artery.  I then placed a 6 mm x 15 mm VBX stent into the sheath that was pulled back and deployed a new stent in the proximal left renal artery across the existing stent  to relign this.  Widely patent stent at completion.  Excellent results. Good filling of the kidney.  We performed a final abdominal angiogram with renal artery arteriogram showing flow into the left renal that was very brisk.  Wires and catheters were removed.  Short 7 French sheath placed in the left common femoral artery.  Taken to holding to have the sheath removed.  Plan: Excellent results after stenting left renal artery today. Patient will stay on aspirin plavix statin.  Will see in 1 month with renal  artery duplex.  Marty Heck, MD [facility] [place] Office: [phone]

## 2022-10-01 NOTE — Progress Notes (Signed)
Pt ambulated to and from bathroom with no oozing from site

## 2022-10-01 NOTE — Progress Notes (Signed)
Site area- left  Site Prior to Removal- 0   Pressure Applied For-  20 MInutes   Bedrest Beginning at - 1600   Manual- Yes   Patient Status During Pull- Stable    Post Pull Groin Site- 0   Post Pull Instructions Given- Yes   Post Pull Pulses Present- Yes    Dressing Applied- Tegaderm and Gauze Dressing    Comments:  no apparent signs if distress. Site level 0

## 2022-10-01 NOTE — H&P (Signed)
History and Physical Interval Note:  10/01/2022 12:05 PM  Jeffrey Stout  has presented today for surgery, with the diagnosis of peripheral vascular disease.  The various methods of treatment have been discussed with the patient and family. After consideration of risks, benefits and other options for treatment, the patient has consented to  Procedure(s): RENAL ANGIOGRAPHY (Left) as a surgical intervention.  The patient's history has been reviewed, patient examined, no change in status, stable for surgery.  I have reviewed the patient's chart and labs.  Questions were answered to the patient's satisfaction.     Marty Heck     Patient name: Jeffrey Stout  MRN: [id number]        DOB: 08-10-1951          Sex: male   REASON FOR VISIT: 40-monthfollow-up with surveillance left renal artery stent, left iliac stents, fem fem bypass   HPI: Jeffrey Beilkeis a 72y.o. male with hx HTN, HLD, tobacco abuse that presents for 6 month follow-up and surveillance.  He had a previous left renal artery stent by Dr. CBridgett Larsson(hx right nephrectomy as well).  He later had left common iliac stent by Dr. CBridgett Larssonon 02/15/2014 for intermittent claudication.  Ultimately at a later date he underwent a left to right femoral-femoral bypass 04/11/2014 also for claudication.  Then he had a drug-coated balloon angioplasty of the left common iliac stent and extension of the stent into the external iliac artery on 11/12/2016 all by Dr. CBridgett Larsson  I have since performed DCB of the left external iliac artery.  He still smoking a pack a day.           Past Medical History:  Diagnosis Date   AAA (abdominal aortic aneurysm) ([facility] 02/03/2018    3cm fusiform, noted on CT abd    Adrenal myelolipoma 03/07/2018    Small, right 1.9x1.5cm, noted on MRI ABD   Aortic atherosclerosis ([place] 03/07/2018    Severe, noted on MRI ABD   Cyst of left kidney 03/07/2018    Multiple, noted on MRI ABD   Diverticulosis 02/03/2018    descending ang  sigmoid, noted on CT abd    GERD (gastroesophageal reflux disease)     Grade I diastolic dysfunction 076/22/6333   Noted on ECHO    H/O renal artery stenosis     Hyperlipidemia     Hypertension     Intermittent claudication (HCC)     LVH (left ventricular hypertrophy) 04/04/2014    Mild, noted on ECHO   Peripheral arterial disease ([place]     Renal artery stenosis ([facility]     Right renal mass 03/07/2018    2.7 cm, noted on MRI ABD   Thrombus of [place] ([place] 02/03/2018    progressive mural, noted on CT abd    TIA (transient ischemic attack) 04/2013    [facility]           Past Surgical History:  Procedure Laterality Date   ABDOMINAL AORTAGRAM N/A 02/15/2014    Procedure: ABDOMINAL AMaxcine Ham  Surgeon: BConrad Meadowbrook MD;  [facility];  Service: Cardiovascular;  Laterality: N/A;   ABDOMINAL AORTOGRAM N/A 01/18/2020    Procedure: ABDOMINAL AORTOGRAM;  Surgeon: CMarty Heck MD;  [facility];  Service: Cardiovascular;  Laterality: N/A;   ABDOMINAL AORTOGRAM W/LOWER EXTREMITY N/A 11/12/2016    Procedure: Abdominal Aortogram w/Lower Extremity;  Surgeon: BConrad Wildomar MD;  [facility];  Service: Cardiovascular;  Laterality: N/A;   CHOLECYSTECTOMY       COLONOSCOPY       FEMORAL-FEMORAL BYPASS GRAFT Bilateral 04/11/2014    Procedure: BYPASS GRAFT LEFT-RIGHT FEMORAL-FEMORAL ARTERY and bilateral femoral endarterectomies with Bovine Patch Angioplasties.;  Surgeon: Conrad Colp, MD;  Location: [place];  Service: Vascular;  Laterality: Bilateral;   LAPAROSCOPIC NEPHRECTOMY Right 05/26/2018    Procedure: LAPAROSCOPIC RADICAL NEPHRECTOMY;  Surgeon: Raynelle Bring, MD;  [facility];  Service: Urology;  Laterality: Right;   PERIPHERAL VASCULAR BALLOON ANGIOPLASTY Left 01/18/2020    Procedure: PERIPHERAL VASCULAR BALLOON ANGIOPLASTY;  Surgeon: Marty Heck, MD;  Location: [place] [facility] LAB;  Service: Cardiovascular;   Laterality: Left;  external iliac   PERIPHERAL VASCULAR INTERVENTION   11/12/2016    Procedure: Peripheral Vascular Intervention;  Surgeon: Conrad De Kalb, MD;  Location: [place] [facility] LAB;  Service: Cardiovascular;;   RENAL ARTERY STENT Left 2015   VASCULAR SURGERY               Family History  Problem Relation Age of Onset   Diabetes Mother     Hypertension Brother     Heart attack Brother        SOCIAL HISTORY: Social History         Tobacco Use   Smoking status: Every Day      Packs/day: 1.50      Years: 53.00      Total pack years: 79.50      Types: Cigarettes   Smokeless tobacco: Never  Substance Use Topics   Alcohol use: No      Alcohol/week: 0.0 standard drinks of alcohol      No Known Allergies         Current Outpatient Medications  Medication Sig Dispense Refill   amLODipine (NORVASC) 10 MG tablet Take 10 mg by mouth daily.        aspirin 81 MG tablet Take 81 mg by mouth daily. Reported on 11/01/2015       benazepril (LOTENSIN) 40 MG tablet Take 40 mg by mouth every evening.       clopidogrel (PLAVIX) 75 MG tablet Take 1 tablet (75 mg total) by mouth daily. 90 tablet 0   metoprolol (LOPRESSOR) 50 MG tablet Take 50 mg by mouth 2 (two) times daily.    5   pravastatin (PRAVACHOL) 40 MG tablet Take 40 mg by mouth at bedtime.        clopidogrel (PLAVIX) 75 MG tablet Take 1 tablet by mouth once daily (Patient not taking: Reported on 09/15/2022) 90 tablet 0    No current facility-administered medications for this visit.      REVIEW OF SYSTEMS:  '[X]'$  denotes positive finding, '[ ]'$  denotes negative finding Cardiac   Comments:  Chest pain or chest pressure:      Shortness of breath upon exertion:      Short of breath when lying flat:      Irregular heart rhythm:             Vascular      Pain in calf, thigh, or hip brought on by ambulation:      Pain in feet at night that wakes you up from your sleep:       Blood clot in your veins:      Leg swelling:               Pulmonary      Oxygen  at home:      Productive cough:       Wheezing:              Neurologic      Sudden weakness in arms or legs:       Sudden numbness in arms or legs:       Sudden onset of difficulty speaking or slurred speech:      Temporary loss of vision in one eye:       Problems with dizziness:              Gastrointestinal      Blood in stool:       Vomited blood:              Genitourinary      Burning when urinating:       Blood in urine:             Psychiatric      Major depression:              Hematologic      Bleeding problems:      Problems with blood clotting too easily:             Skin      Rashes or ulcers:             Constitutional      Fever or chills:          PHYSICAL EXAM:    Vitals:    09/15/22 1053  BP: (!) 168/88  Pulse: (!) 54  Resp: 16  Temp: (!) 97.3 F (36.3 C)  TempSrc: Temporal  SpO2: 96%  Weight: 182 lb (82.6 kg)  Height: '6\' 2"'$  (1.88 m)      GENERAL: The patient is a well-nourished male, in no acute distress. The vital signs are documented above. CARDIAC: There is a regular rate and rhythm.  VASCULAR:  2+ palpable bilateral femoral pulses, pulse in fem fem graft Palpable DP pulse bilaterally No lower extremity tissue loss PULMONARY: No respiratory distress. ABDOMEN: Soft and non-tender. MUSCULOSKELETAL: There are no major deformities or cyanosis. NEUROLOGIC: No focal weakness or paresthesias are detected. SKIN: There are no ulcers or rashes noted.     DATA:    Renal artery duplex today suggest left renal artery stenosis over 60% in the midportion of the artery with a velocity of 424/92 and a RAR ratio of 7.4.   Femorofemoral bypass is widely patent by duplex today.   ABI's today are 0.89 on the right multiphasic and 0.84 on the left multiphasic   Assessment/Plan:   72 year old male that presents for 66-monthinterval surveillance of his left renal artery stent as well as his left iliac artery stents and  left to right femorofemoral bypass.   Discussed I am concerned that his renal artery duplex today shows his left renal artery has a recurrent high-grade stenosis.  He had a previous right nephrectomy and the stented left renal artery is his only functioning kidney.  I have recommended aortogram with a focus on the left renal artery with renal artery arteriogram with possible intervention to maintain long-term patency of his stent.  I did review a recent CT scan obtained by his urologist and his left iliac stents look patent and today's duplex suggest this femorofemoral bypass is widely patent as well without significant stenosis.  I could also review his femorofemoral bypass on CT scan and I do not see any significant disease.  He has known mural thrombus in his aorta particularly in the infrarenal aorta.  He has no evidenece of aneurysm on the CT scan in the abdominal aorta.   Marty Heck, MD [facility] [place] Office: [place]

## 2022-10-02 ENCOUNTER — Encounter (HOSPITAL_COMMUNITY): Payer: Self-pay | Admitting: Vascular Surgery

## 2022-10-23 ENCOUNTER — Other Ambulatory Visit: Payer: Self-pay | Admitting: *Deleted

## 2022-10-23 DIAGNOSIS — I701 Atherosclerosis of renal artery: Secondary | ICD-10-CM

## 2022-11-03 ENCOUNTER — Ambulatory Visit (INDEPENDENT_AMBULATORY_CARE_PROVIDER_SITE_OTHER): Payer: Medicare Other | Admitting: Vascular Surgery

## 2022-11-03 ENCOUNTER — Ambulatory Visit (HOSPITAL_COMMUNITY)
Admission: RE | Admit: 2022-11-03 | Discharge: 2022-11-03 | Disposition: A | Discharge status: Home / Self Care | Payer: Medicare Other | Source: Ambulatory Visit | Attending: Vascular Surgery | Admitting: Vascular Surgery

## 2022-11-03 ENCOUNTER — Encounter: Payer: Self-pay | Admitting: Vascular Surgery

## 2022-11-03 VITALS — BP 146/85 | HR 58 | Temp 97.8°F | Resp 18 | Ht 74.0 in | Wt 180.0 lb

## 2022-11-03 DIAGNOSIS — I701 Atherosclerosis of renal artery: Secondary | ICD-10-CM | POA: Diagnosis not present

## 2022-11-03 NOTE — Progress Notes (Signed)
Patient name: Jeffrey Stout MRN: [id number] DOB: Nov 17, 1950 Sex: male  REASON FOR VISIT: Hospital follow-up left renal artery stent  HPI: Jeffrey Stout is a 72 y.o. male with hx HTN, HLD, tobacco abuse that presents for hospital follow-up after left renal artery stenting.  He had a previous left renal artery stent by Dr. Bridgett Larsson (hx right nephrectomy as well).  He later had left common iliac stent by Dr. Bridgett Larsson on 02/15/2014 for intermittent claudication.  Ultimately at a later date he underwent a left to right femoral-femoral bypass 04/11/2014 also for claudication.  Then he had a drug-coated balloon angioplasty of the left common iliac stent and extension of the stent into the external iliac artery on 11/12/2016 all by Dr. Bridgett Larsson.  I have since performed DCB of the left external iliac artery.    We took him back for left renal artery stenting after we identified a high-grade stenosis on surveillance with velocity 424.  He reports no issues with his left groin access.  Remains on aspirin Plavix.  Has had a fair amount of bruising on his upper extremities.    Past Medical History:  Diagnosis Date   AAA (abdominal aortic aneurysm) ([place]) 02/03/2018   3cm fusiform, noted on CT abd    Adrenal myelolipoma 03/07/2018   Small, right 1.9x1.5cm, noted on MRI ABD   Aortic atherosclerosis ([place]) 03/07/2018   Severe, noted on MRI ABD   Cyst of left kidney 03/07/2018   Multiple, noted on MRI ABD   Diverticulosis 02/03/2018   descending ang sigmoid, noted on CT abd    GERD (gastroesophageal reflux disease)    Grade I diastolic dysfunction [id number]   Noted on ECHO    H/O renal artery stenosis    Hyperlipidemia    Hypertension    Intermittent claudication (HCC)    LVH (left ventricular hypertrophy) 04/04/2014   Mild, noted on ECHO   Peripheral arterial disease ([place])    Renal artery stenosis ([place])    Right renal mass 03/07/2018   2.7 cm, noted on MRI ABD   Thrombus of aorta ([place]) 02/03/2018    progressive mural, noted on CT abd    TIA (transient ischemic attack) 04/2013   [facility]    Past Surgical History:  Procedure Laterality Date   ABDOMINAL AORTAGRAM N/A 02/15/2014   Procedure: ABDOMINAL Maxcine Ham;  Surgeon: Conrad Guinda, MD;  Location: [facility] CATH [facility];  Service: Cardiovascular;  Laterality: N/A;   ABDOMINAL AORTOGRAM N/A 01/18/2020   Procedure: ABDOMINAL AORTOGRAM;  Surgeon: Marty Heck, MD;  Location: [place] CV LAB;  Service: Cardiovascular;  Laterality: N/A;   ABDOMINAL AORTOGRAM W/LOWER EXTREMITY N/A 11/12/2016   Procedure: Abdominal Aortogram w/Lower Extremity;  Surgeon: Conrad Grover, MD;  Location: [place] CV LAB;  Service: Cardiovascular;  Laterality: N/A;   CHOLECYSTECTOMY     COLONOSCOPY     FEMORAL-FEMORAL BYPASS GRAFT Bilateral 04/11/2014   Procedure: BYPASS GRAFT LEFT-RIGHT FEMORAL-FEMORAL ARTERY and bilateral femoral endarterectomies with Bovine Patch Angioplasties.;  Surgeon: Conrad Val Verde, MD;  Location: [place];  Service: Vascular;  Laterality: Bilateral;   LAPAROSCOPIC NEPHRECTOMY Right 05/26/2018   Procedure: LAPAROSCOPIC RADICAL NEPHRECTOMY;  Surgeon: Raynelle Bring, MD;  [facility];  Service: Urology;  Laterality: Right;   PERIPHERAL VASCULAR BALLOON ANGIOPLASTY Left 01/18/2020   Procedure: PERIPHERAL VASCULAR BALLOON ANGIOPLASTY;  Surgeon: Marty Heck, MD;  Location: [place] CV LAB;  Service: Cardiovascular;  Laterality: Left;  external iliac   PERIPHERAL VASCULAR  INTERVENTION  11/12/2016   Procedure: Peripheral Vascular Intervention;  Surgeon: Conrad Torrey, MD;  Location: [place] CV LAB;  Service: Cardiovascular;;   PERIPHERAL VASCULAR INTERVENTION  10/01/2022   Procedure: PERIPHERAL VASCULAR INTERVENTION;  Surgeon: Marty Heck, MD;  Location: [place] [facility] LAB;  Service: Cardiovascular;;   RENAL ANGIOGRAPHY Left 10/01/2022   Procedure: RENAL ANGIOGRAPHY;  Surgeon: Marty Heck, MD;   Location: [place] CV LAB;  Service: Cardiovascular;  Laterality: Left;   RENAL ARTERY STENT Left 2015   VASCULAR SURGERY      Family History  Problem Relation Age of Onset   Diabetes Mother    Hypertension Brother    Heart attack Brother     SOCIAL HISTORY: Social History   Tobacco Use   Smoking status: Every Day    Packs/day: 1.50    Years: 53.00    Total pack years: 79.50    Types: Cigarettes   Smokeless tobacco: Never  Substance Use Topics   Alcohol use: No    Alcohol/week: 0.0 standard drinks of alcohol    No Known Allergies  Current Outpatient Medications  Medication Sig Dispense Refill   amLODipine (NORVASC) 10 MG tablet Take 10 mg by mouth daily.      aspirin 81 MG tablet Take 81 mg by mouth daily. Reported on 11/01/2015     benazepril (LOTENSIN) 40 MG tablet Take 40 mg by mouth every evening.     Ca Carbonate-Mag Hydroxide (ROLAIDS PO) Take 1 tablet by mouth daily as needed (heartburn).     clopidogrel (PLAVIX) 75 MG tablet Take 1 tablet (75 mg total) by mouth daily. 90 tablet 0   hydrALAZINE (APRESOLINE) 25 MG tablet Take 25 mg by mouth daily.     metoprolol (LOPRESSOR) 50 MG tablet Take 50 mg by mouth 2 (two) times daily.   5   pravastatin (PRAVACHOL) 40 MG tablet Take 40 mg by mouth at bedtime.      No current facility-administered medications for this visit.    REVIEW OF SYSTEMS:  '[X]'$  denotes positive finding, '[ ]'$  denotes negative finding Cardiac  Comments:  Chest pain or chest pressure:    Shortness of breath upon exertion:    Short of breath when lying flat:    Irregular heart rhythm:        Vascular    Pain in calf, thigh, or hip brought on by ambulation:    Pain in feet at night that wakes you up from your sleep:     Blood clot in your veins:    Leg swelling:         Pulmonary    Oxygen at home:    Productive cough:     Wheezing:         Neurologic    Sudden weakness in arms or legs:     Sudden numbness in arms or legs:     Sudden  onset of difficulty speaking or slurred speech:    Temporary loss of vision in one eye:     Problems with dizziness:         Gastrointestinal    Blood in stool:     Vomited blood:         Genitourinary    Burning when urinating:     Blood in urine:        Psychiatric    Major depression:         Hematologic    Bleeding problems:    Problems  with blood clotting too easily:        Skin    Rashes or ulcers:        Constitutional    Fever or chills:      PHYSICAL EXAM: Vitals:   11/03/22 0911  BP: (!) 146/85  Pulse: (!) 58  Resp: 18  Temp: 97.8 F (36.6 C)  TempSrc: Temporal  SpO2: 96%  Weight: 180 lb (81.6 kg)  Height: '6\' 2"'$  (1.88 m)    GENERAL: The patient is a well-nourished male, in no acute distress. The vital signs are documented above. CARDIAC: There is a regular rate and rhythm.  VASCULAR:  2+ palpable bilateral femoral pulses, pulse in fem fem graft PULMONARY: No respiratory distress. ABDOMEN: Soft and non-tender. MUSCULOSKELETAL: There are no major deformities or cyanosis. NEUROLOGIC: No focal weakness or paresthesias are detected. SKIN: There are no ulcers or rashes noted.   DATA:   Renal artery duplex today suggest ongoing left renal artery stenosis  but improved since intervention now 157 proximal (previously 252) and 360 mid (previously 424)     Assessment/Plan:  72 year old male that presents for hospital follow-up after recent left renal artery intervention.  He previously had a left renal stent placed by Dr. Bridgett Larsson years ago.  We identified a recurrent high-grade stenosis in the left renal artery stent (with hx right nephrectomy) with a velocity of 424 suggesting a greater than 60% stenosis.  During intervention noted to have a 35 point pressure gradient in the left renal artery stent and this was realigned with a 6 x 15 VBX with no immediate complications.  Duplex today has a higher velocity than I  would like of 360 but improved since  intervention.  Discussed he remain on aspirin Plavix for stent patency.  I will not plan any additional intervention at this time as we had great technical results.  I will see him again in 3 months for another duplex for close interval surveillance.     Marty Heck, MD [facility] [place] Office: [place]

## 2022-11-04 ENCOUNTER — Other Ambulatory Visit: Payer: Self-pay

## 2022-11-04 DIAGNOSIS — I701 Atherosclerosis of renal artery: Secondary | ICD-10-CM

## 2023-02-08 NOTE — Progress Notes (Unsigned)
Patient name: Jeffrey Stout MRN: [id number] DOB: Oct 13, 1950 Sex: male  REASON FOR VISIT: 3 month follow-up left renal artery stent  HPI: Jeffrey Stout is a 72 y.o. male with hx HTN, HLD, tobacco abuse that presents for 3 month follow-up after left renal artery stenting.  He had a previous left renal artery stent by Dr. Imogene Burn (hx right nephrectomy as well).  He later had left common iliac stent by Dr. Imogene Burn on 02/15/2014 for intermittent claudication.  Ultimately at a later date he underwent a left to right femoral-femoral bypass 04/11/2014 also for claudication.  Then he had a drug-coated balloon angioplasty of the left common iliac stent and extension of the stent into the external iliac artery on 11/12/2016 all by Dr. Imogene Burn.  I have since performed DCB of the left external iliac artery.    We took him back for left renal artery stenting after we identified a high-grade stenosis on surveillance with velocity 424.  He reports no issues with his left groin access.  Remains on aspirin Plavix.  Has had a fair amount of bruising on his upper extremities.    Past Medical History:  Diagnosis Date   AAA (abdominal aortic aneurysm) (HCC) 02/03/2018   3cm fusiform, noted on CT abd    Adrenal myelolipoma 03/07/2018   Small, right 1.9x1.5cm, noted on MRI ABD   Aortic atherosclerosis (HCC) 03/07/2018   Severe, noted on MRI ABD   Cyst of left kidney 03/07/2018   Multiple, noted on MRI ABD   Diverticulosis 02/03/2018   descending ang sigmoid, noted on CT abd    GERD (gastroesophageal reflux disease)    Grade I diastolic dysfunction 04/04/2014   Noted on ECHO    H/O renal artery stenosis    Hyperlipidemia    Hypertension    Intermittent claudication (HCC)    LVH (left ventricular hypertrophy) 04/04/2014   Mild, noted on ECHO   Peripheral arterial disease (HCC)    Renal artery stenosis (HCC)    Right renal mass 03/07/2018   2.7 cm, noted on MRI ABD   Thrombus of aorta (HCC) 02/03/2018   progressive  mural, noted on CT abd    TIA (transient ischemic attack) 04/2013   went to [facility] for evaluation    Past Surgical History:  Procedure Laterality Date   ABDOMINAL AORTAGRAM N/A 02/15/2014   Procedure: ABDOMINAL Ronny Flurry;  Surgeon: Fransisco Hertz, MD;  Location: [facility] CATH LAB;  Service: Cardiovascular;  Laterality: N/A;   ABDOMINAL AORTOGRAM N/A 01/18/2020   Procedure: ABDOMINAL AORTOGRAM;  Surgeon: Cephus Shelling, MD;  Location: [facility] INVASIVE CV LAB;  Service: Cardiovascular;  Laterality: N/A;   ABDOMINAL AORTOGRAM W/LOWER EXTREMITY N/A 11/12/2016   Procedure: Abdominal Aortogram w/Lower Extremity;  Surgeon: Fransisco Hertz, MD;  Location: [facility] INVASIVE CV LAB;  Service: Cardiovascular;  Laterality: N/A;   CHOLECYSTECTOMY     COLONOSCOPY     FEMORAL-FEMORAL BYPASS GRAFT Bilateral 04/11/2014   Procedure: BYPASS GRAFT LEFT-RIGHT FEMORAL-FEMORAL ARTERY and bilateral femoral endarterectomies with Bovine Patch Angioplasties.;  Surgeon: Fransisco Hertz, MD;  Location: [facility] OR;  Service: Vascular;  Laterality: Bilateral;   LAPAROSCOPIC NEPHRECTOMY Right 05/26/2018   Procedure: LAPAROSCOPIC RADICAL NEPHRECTOMY;  Surgeon: Heloise Purpura, MD;  Location: [facility] ORS;  Service: Urology;  Laterality: Right;   PERIPHERAL VASCULAR BALLOON ANGIOPLASTY Left 01/18/2020   Procedure: PERIPHERAL VASCULAR BALLOON ANGIOPLASTY;  Surgeon: Cephus Shelling, MD;  Location: [facility] INVASIVE CV LAB;  Service: Cardiovascular;  Laterality: Left;  external iliac  PERIPHERAL VASCULAR INTERVENTION  11/12/2016   Procedure: Peripheral Vascular Intervention;  Surgeon: Fransisco Hertz, MD;  Location: [facility] INVASIVE CV LAB;  Service: Cardiovascular;;   PERIPHERAL VASCULAR INTERVENTION  10/01/2022   Procedure: PERIPHERAL VASCULAR INTERVENTION;  Surgeon: Cephus Shelling, MD;  Location: [facility] INVASIVE CV LAB;  Service: Cardiovascular;;   RENAL ANGIOGRAPHY Left 10/01/2022   Procedure: RENAL ANGIOGRAPHY;  Surgeon: Cephus Shelling, MD;  Location: [facility]  INVASIVE CV LAB;  Service: Cardiovascular;  Laterality: Left;   RENAL ARTERY STENT Left 2015   VASCULAR SURGERY      Family History  Problem Relation Age of Onset   Diabetes Mother    Hypertension Brother    Heart attack Brother     SOCIAL HISTORY: Social History   Tobacco Use   Smoking status: Every Day    Packs/day: 1.50    Years: 53.00    Additional pack years: 0.00    Total pack years: 79.50    Types: Cigarettes   Smokeless tobacco: Never  Substance Use Topics   Alcohol use: No    Alcohol/week: 0.0 standard drinks of alcohol    No Known Allergies  Current Outpatient Medications  Medication Sig Dispense Refill   amLODipine (NORVASC) 10 MG tablet Take 10 mg by mouth daily.      aspirin 81 MG tablet Take 81 mg by mouth daily. Reported on 11/01/2015     benazepril (LOTENSIN) 40 MG tablet Take 40 mg by mouth every evening.     Ca Carbonate-Mag Hydroxide (ROLAIDS PO) Take 1 tablet by mouth daily as needed (heartburn).     clopidogrel (PLAVIX) 75 MG tablet Take 1 tablet (75 mg total) by mouth daily. 90 tablet 0   hydrALAZINE (APRESOLINE) 25 MG tablet Take 25 mg by mouth daily.     metoprolol (LOPRESSOR) 50 MG tablet Take 50 mg by mouth 2 (two) times daily.   5   pravastatin (PRAVACHOL) 40 MG tablet Take 40 mg by mouth at bedtime.      No current facility-administered medications for this visit.    REVIEW OF SYSTEMS:  [X]  denotes positive finding, [ ]  denotes negative finding Cardiac  Comments:  Chest pain or chest pressure:    Shortness of breath upon exertion:    Short of breath when lying flat:    Irregular heart rhythm:        Vascular    Pain in calf, thigh, or hip brought on by ambulation:    Pain in feet at night that wakes you up from your sleep:     Blood clot in your veins:    Leg swelling:         Pulmonary    Oxygen at home:    Productive cough:     Wheezing:         Neurologic    Sudden weakness in arms or legs:     Sudden numbness in arms or  legs:     Sudden onset of difficulty speaking or slurred speech:    Temporary loss of vision in one eye:     Problems with dizziness:         Gastrointestinal    Blood in stool:     Vomited blood:         Genitourinary    Burning when urinating:     Blood in urine:        Psychiatric    Major depression:         Hematologic  Bleeding problems:    Problems with blood clotting too easily:        Skin    Rashes or ulcers:        Constitutional    Fever or chills:      PHYSICAL EXAM: There were no vitals filed for this visit.   GENERAL: The patient is a well-nourished male, in no acute distress. The vital signs are documented above. CARDIAC: There is a regular rate and rhythm.  VASCULAR:  2+ palpable bilateral femoral pulses, pulse in fem fem graft PULMONARY: No respiratory distress. ABDOMEN: Soft and non-tender. MUSCULOSKELETAL: There are no major deformities or cyanosis. NEUROLOGIC: No focal weakness or paresthesias are detected. SKIN: There are no ulcers or rashes noted.   DATA:   Renal artery duplex today    Assessment/Plan:  72 year old male that presents for 3 month follow-up after recent left renal artery intervention.  He previously had a left renal stent placed by Dr. Imogene Burn years ago.  We identified a recurrent high-grade stenosis in the left renal artery stent (with hx right nephrectomy) with a velocity of 424 suggesting a greater than 60% stenosis.  During intervention noted to have a 35 point pressure gradient in the left renal artery stent and this was realigned with a 6 x 15 VBX with no immediate complications.     Cephus Shelling, MD [facility] Office: [phone]  Cephus Shelling

## 2023-02-09 ENCOUNTER — Ambulatory Visit (HOSPITAL_COMMUNITY)
Admission: RE | Admit: 2023-02-09 | Discharge: 2023-02-09 | Disposition: A | Discharge status: Home / Self Care | Payer: Medicare Other | Source: Ambulatory Visit | Attending: Vascular Surgery | Admitting: Vascular Surgery

## 2023-02-09 ENCOUNTER — Ambulatory Visit (INDEPENDENT_AMBULATORY_CARE_PROVIDER_SITE_OTHER): Payer: Medicare Other | Admitting: Vascular Surgery

## 2023-02-09 ENCOUNTER — Encounter: Payer: Self-pay | Admitting: Vascular Surgery

## 2023-02-09 VITALS — BP 132/71 | HR 56 | Temp 97.4°F | Resp 16 | Ht 73.0 in | Wt 177.0 lb

## 2023-02-09 DIAGNOSIS — I701 Atherosclerosis of renal artery: Secondary | ICD-10-CM | POA: Diagnosis not present

## 2023-02-15 ENCOUNTER — Other Ambulatory Visit: Payer: Self-pay | Admitting: Vascular Surgery

## 2023-02-15 DIAGNOSIS — I739 Peripheral vascular disease, unspecified: Secondary | ICD-10-CM

## 2023-02-20 ENCOUNTER — Other Ambulatory Visit: Payer: Self-pay

## 2023-02-20 DIAGNOSIS — I745 Embolism and thrombosis of iliac artery: Secondary | ICD-10-CM

## 2023-02-20 DIAGNOSIS — I714 Abdominal aortic aneurysm, without rupture, unspecified: Secondary | ICD-10-CM

## 2023-02-20 DIAGNOSIS — I701 Atherosclerosis of renal artery: Secondary | ICD-10-CM

## 2023-02-20 DIAGNOSIS — I739 Peripheral vascular disease, unspecified: Secondary | ICD-10-CM

## 2023-04-29 DIAGNOSIS — Z905 Acquired absence of kidney: Secondary | ICD-10-CM | POA: Diagnosis not present

## 2023-04-29 DIAGNOSIS — Z720 Tobacco use: Secondary | ICD-10-CM | POA: Diagnosis not present

## 2023-04-29 DIAGNOSIS — Z0000 Encounter for general adult medical examination without abnormal findings: Secondary | ICD-10-CM | POA: Diagnosis not present

## 2023-04-29 DIAGNOSIS — I129 Hypertensive chronic kidney disease with stage 1 through stage 4 chronic kidney disease, or unspecified chronic kidney disease: Secondary | ICD-10-CM | POA: Diagnosis not present

## 2023-04-29 DIAGNOSIS — Z8679 Personal history of other diseases of the circulatory system: Secondary | ICD-10-CM | POA: Diagnosis not present

## 2023-04-29 DIAGNOSIS — N183 Chronic kidney disease, stage 3 unspecified: Secondary | ICD-10-CM | POA: Diagnosis not present

## 2023-05-04 ENCOUNTER — Emergency Department (HOSPITAL_COMMUNITY): Payer: Medicare Other

## 2023-05-04 ENCOUNTER — Inpatient Hospital Stay (HOSPITAL_COMMUNITY)
Admission: EM | Admit: 2023-05-04 | Discharge: 2023-05-25 | DRG: 064 | Disposition: E | Payer: Medicare Other | Attending: Neurology | Admitting: Neurology

## 2023-05-04 DIAGNOSIS — R1310 Dysphagia, unspecified: Secondary | ICD-10-CM | POA: Diagnosis present

## 2023-05-04 DIAGNOSIS — I4891 Unspecified atrial fibrillation: Secondary | ICD-10-CM | POA: Diagnosis present

## 2023-05-04 DIAGNOSIS — J9601 Acute respiratory failure with hypoxia: Secondary | ICD-10-CM

## 2023-05-04 DIAGNOSIS — R531 Weakness: Secondary | ICD-10-CM | POA: Diagnosis not present

## 2023-05-04 DIAGNOSIS — F17210 Nicotine dependence, cigarettes, uncomplicated: Secondary | ICD-10-CM | POA: Diagnosis present

## 2023-05-04 DIAGNOSIS — R569 Unspecified convulsions: Secondary | ICD-10-CM | POA: Diagnosis not present

## 2023-05-04 DIAGNOSIS — G935 Compression of brain: Secondary | ICD-10-CM | POA: Diagnosis not present

## 2023-05-04 DIAGNOSIS — R509 Fever, unspecified: Secondary | ICD-10-CM | POA: Diagnosis present

## 2023-05-04 DIAGNOSIS — S06359A Traumatic hemorrhage of left cerebrum with loss of consciousness of unspecified duration, initial encounter: Secondary | ICD-10-CM | POA: Diagnosis not present

## 2023-05-04 DIAGNOSIS — I619 Nontraumatic intracerebral hemorrhage, unspecified: Secondary | ICD-10-CM | POA: Diagnosis not present

## 2023-05-04 DIAGNOSIS — T426X5A Adverse effect of other antiepileptic and sedative-hypnotic drugs, initial encounter: Secondary | ICD-10-CM | POA: Diagnosis present

## 2023-05-04 DIAGNOSIS — S80212A Abrasion, left knee, initial encounter: Secondary | ICD-10-CM | POA: Diagnosis not present

## 2023-05-04 DIAGNOSIS — R4701 Aphasia: Secondary | ICD-10-CM | POA: Diagnosis not present

## 2023-05-04 DIAGNOSIS — I739 Peripheral vascular disease, unspecified: Secondary | ICD-10-CM | POA: Diagnosis present

## 2023-05-04 DIAGNOSIS — N179 Acute kidney failure, unspecified: Secondary | ICD-10-CM | POA: Diagnosis not present

## 2023-05-04 DIAGNOSIS — I680 Cerebral amyloid angiopathy: Secondary | ICD-10-CM | POA: Diagnosis present

## 2023-05-04 DIAGNOSIS — Z7902 Long term (current) use of antithrombotics/antiplatelets: Secondary | ICD-10-CM | POA: Diagnosis not present

## 2023-05-04 DIAGNOSIS — R29818 Other symptoms and signs involving the nervous system: Secondary | ICD-10-CM | POA: Diagnosis not present

## 2023-05-04 DIAGNOSIS — S199XXA Unspecified injury of neck, initial encounter: Secondary | ICD-10-CM | POA: Diagnosis not present

## 2023-05-04 DIAGNOSIS — Z049 Encounter for examination and observation for unspecified reason: Secondary | ICD-10-CM | POA: Diagnosis not present

## 2023-05-04 DIAGNOSIS — I610 Nontraumatic intracerebral hemorrhage in hemisphere, subcortical: Principal | ICD-10-CM | POA: Diagnosis present

## 2023-05-04 DIAGNOSIS — Z66 Do not resuscitate: Secondary | ICD-10-CM | POA: Diagnosis present

## 2023-05-04 DIAGNOSIS — R4182 Altered mental status, unspecified: Secondary | ICD-10-CM | POA: Diagnosis not present

## 2023-05-04 DIAGNOSIS — S066X0A Traumatic subarachnoid hemorrhage without loss of consciousness, initial encounter: Secondary | ICD-10-CM | POA: Diagnosis not present

## 2023-05-04 DIAGNOSIS — I609 Nontraumatic subarachnoid hemorrhage, unspecified: Secondary | ICD-10-CM | POA: Diagnosis not present

## 2023-05-04 DIAGNOSIS — E854 Organ-limited amyloidosis: Secondary | ICD-10-CM | POA: Diagnosis present

## 2023-05-04 DIAGNOSIS — Z043 Encounter for examination and observation following other accident: Secondary | ICD-10-CM | POA: Diagnosis not present

## 2023-05-04 DIAGNOSIS — Z4682 Encounter for fitting and adjustment of non-vascular catheter: Secondary | ICD-10-CM | POA: Diagnosis not present

## 2023-05-04 DIAGNOSIS — G8191 Hemiplegia, unspecified affecting right dominant side: Secondary | ICD-10-CM | POA: Diagnosis present

## 2023-05-04 DIAGNOSIS — S06320A Contusion and laceration of left cerebrum without loss of consciousness, initial encounter: Secondary | ICD-10-CM | POA: Diagnosis not present

## 2023-05-04 DIAGNOSIS — I629 Nontraumatic intracranial hemorrhage, unspecified: Secondary | ICD-10-CM | POA: Diagnosis not present

## 2023-05-04 DIAGNOSIS — R739 Hyperglycemia, unspecified: Secondary | ICD-10-CM | POA: Diagnosis present

## 2023-05-04 DIAGNOSIS — I952 Hypotension due to drugs: Secondary | ICD-10-CM

## 2023-05-04 DIAGNOSIS — Z515 Encounter for palliative care: Secondary | ICD-10-CM | POA: Diagnosis not present

## 2023-05-04 DIAGNOSIS — Z905 Acquired absence of kidney: Secondary | ICD-10-CM

## 2023-05-04 DIAGNOSIS — E872 Acidosis, unspecified: Secondary | ICD-10-CM | POA: Diagnosis not present

## 2023-05-04 DIAGNOSIS — R930 Abnormal findings on diagnostic imaging of skull and head, not elsewhere classified: Secondary | ICD-10-CM | POA: Diagnosis not present

## 2023-05-04 DIAGNOSIS — I672 Cerebral atherosclerosis: Secondary | ICD-10-CM | POA: Diagnosis not present

## 2023-05-04 DIAGNOSIS — R9082 White matter disease, unspecified: Secondary | ICD-10-CM | POA: Diagnosis not present

## 2023-05-04 DIAGNOSIS — G936 Cerebral edema: Secondary | ICD-10-CM | POA: Diagnosis not present

## 2023-05-04 DIAGNOSIS — W1789XA Other fall from one level to another, initial encounter: Secondary | ICD-10-CM | POA: Diagnosis present

## 2023-05-04 DIAGNOSIS — S3991XA Unspecified injury of abdomen, initial encounter: Secondary | ICD-10-CM | POA: Diagnosis not present

## 2023-05-04 DIAGNOSIS — Z7982 Long term (current) use of aspirin: Secondary | ICD-10-CM | POA: Diagnosis not present

## 2023-05-04 DIAGNOSIS — I611 Nontraumatic intracerebral hemorrhage in hemisphere, cortical: Secondary | ICD-10-CM | POA: Diagnosis not present

## 2023-05-04 DIAGNOSIS — E785 Hyperlipidemia, unspecified: Secondary | ICD-10-CM | POA: Diagnosis present

## 2023-05-04 DIAGNOSIS — S0083XA Contusion of other part of head, initial encounter: Secondary | ICD-10-CM | POA: Diagnosis not present

## 2023-05-04 DIAGNOSIS — M47812 Spondylosis without myelopathy or radiculopathy, cervical region: Secondary | ICD-10-CM | POA: Diagnosis not present

## 2023-05-04 DIAGNOSIS — S0990XA Unspecified injury of head, initial encounter: Secondary | ICD-10-CM | POA: Diagnosis not present

## 2023-05-04 DIAGNOSIS — I6521 Occlusion and stenosis of right carotid artery: Secondary | ICD-10-CM | POA: Diagnosis not present

## 2023-05-04 DIAGNOSIS — J9811 Atelectasis: Secondary | ICD-10-CM | POA: Diagnosis not present

## 2023-05-04 DIAGNOSIS — S0635AA Traumatic hemorrhage of left cerebrum with loss of consciousness status unknown, initial encounter: Secondary | ICD-10-CM | POA: Diagnosis not present

## 2023-05-04 DIAGNOSIS — Z978 Presence of other specified devices: Secondary | ICD-10-CM

## 2023-05-04 DIAGNOSIS — I6389 Other cerebral infarction: Secondary | ICD-10-CM | POA: Diagnosis not present

## 2023-05-04 DIAGNOSIS — W1830XA Fall on same level, unspecified, initial encounter: Secondary | ICD-10-CM | POA: Diagnosis not present

## 2023-05-04 DIAGNOSIS — Y92007 Garden or yard of unspecified non-institutional (private) residence as the place of occurrence of the external cause: Secondary | ICD-10-CM | POA: Diagnosis not present

## 2023-05-04 DIAGNOSIS — Z87891 Personal history of nicotine dependence: Secondary | ICD-10-CM

## 2023-05-04 DIAGNOSIS — I129 Hypertensive chronic kidney disease with stage 1 through stage 4 chronic kidney disease, or unspecified chronic kidney disease: Secondary | ICD-10-CM | POA: Diagnosis not present

## 2023-05-04 DIAGNOSIS — R0989 Other specified symptoms and signs involving the circulatory and respiratory systems: Secondary | ICD-10-CM | POA: Diagnosis not present

## 2023-05-04 DIAGNOSIS — N184 Chronic kidney disease, stage 4 (severe): Secondary | ICD-10-CM | POA: Diagnosis present

## 2023-05-04 DIAGNOSIS — J439 Emphysema, unspecified: Secondary | ICD-10-CM | POA: Diagnosis not present

## 2023-05-04 DIAGNOSIS — G9341 Metabolic encephalopathy: Secondary | ICD-10-CM

## 2023-05-04 DIAGNOSIS — Z85528 Personal history of other malignant neoplasm of kidney: Secondary | ICD-10-CM | POA: Diagnosis not present

## 2023-05-04 DIAGNOSIS — S299XXA Unspecified injury of thorax, initial encounter: Secondary | ICD-10-CM | POA: Diagnosis not present

## 2023-05-04 DIAGNOSIS — Z79899 Other long term (current) drug therapy: Secondary | ICD-10-CM

## 2023-05-04 DIAGNOSIS — Z9911 Dependence on respirator [ventilator] status: Secondary | ICD-10-CM | POA: Diagnosis not present

## 2023-05-04 DIAGNOSIS — S3993XA Unspecified injury of pelvis, initial encounter: Secondary | ICD-10-CM | POA: Diagnosis not present

## 2023-05-04 DIAGNOSIS — S06360A Traumatic hemorrhage of cerebrum, unspecified, without loss of consciousness, initial encounter: Secondary | ICD-10-CM | POA: Diagnosis not present

## 2023-05-04 HISTORY — DX: Malignant neoplasm of unspecified kidney, except renal pelvis: C64.9

## 2023-05-04 HISTORY — DX: Essential (primary) hypertension: I10

## 2023-05-04 HISTORY — DX: Acquired absence of kidney: Z90.5

## 2023-05-04 HISTORY — DX: Abdominal aortic aneurysm, without rupture, unspecified: I71.40

## 2023-05-04 LAB — CBC
HCT: 49.7 % (ref 39.0–52.0)
Hemoglobin: 16.9 g/dL (ref 13.0–17.0)
MCH: 32.3 pg (ref 26.0–34.0)
MCHC: 34 g/dL (ref 30.0–36.0)
MCV: 94.8 fL (ref 80.0–100.0)
Platelets: 179 10*3/uL (ref 150–400)
RBC: 5.24 MIL/uL (ref 4.22–5.81)
RDW: 12.1 % (ref 11.5–15.5)
WBC: 14.1 10*3/uL — ABNORMAL HIGH (ref 4.0–10.5)
nRBC: 0 % (ref 0.0–0.2)

## 2023-05-04 LAB — COMPREHENSIVE METABOLIC PANEL
ALT: 37 U/L (ref 0–44)
AST: 26 U/L (ref 15–41)
Albumin: 4.1 g/dL (ref 3.5–5.0)
Alkaline Phosphatase: 82 U/L (ref 38–126)
Anion gap: 11 (ref 5–15)
BUN: 17 mg/dL (ref 8–23)
CO2: 23 mmol/L (ref 22–32)
Calcium: 9.1 mg/dL (ref 8.9–10.3)
Chloride: 102 mmol/L (ref 98–111)
Creatinine, Ser: 1.63 mg/dL — ABNORMAL HIGH (ref 0.61–1.24)
GFR, Estimated: 28 mL/min — ABNORMAL LOW (ref >60)
Glucose, Bld: 171 mg/dL — ABNORMAL HIGH (ref 70–99)
Potassium: 4.8 mmol/L (ref 3.5–5.1)
Sodium: 136 mmol/L (ref 135–145)
Total Bilirubin: 0.8 mg/dL (ref 0.3–1.2)
Total Protein: 7.4 g/dL (ref 6.5–8.1)

## 2023-05-04 LAB — I-STAT CHEM 8, ED
BUN: 19 mg/dL (ref 8–23)
Calcium, Ion: 1.13 mmol/L — ABNORMAL LOW (ref 1.15–1.40)
Chloride: 104 mmol/L (ref 98–111)
Creatinine, Ser: 1.7 mg/dL — ABNORMAL HIGH (ref 0.61–1.24)
Glucose, Bld: 168 mg/dL — ABNORMAL HIGH (ref 70–99)
HCT: 49 % (ref 39.0–52.0)
Hemoglobin: 16.7 g/dL (ref 13.0–17.0)
Potassium: 4.8 mmol/L (ref 3.5–5.1)
Sodium: 139 mmol/L (ref 135–145)
TCO2: 23 mmol/L (ref 22–32)

## 2023-05-04 LAB — PROTIME-INR
INR: 1.1 (ref 0.8–1.2)
Prothrombin Time: 14.2 s (ref 11.4–15.2)

## 2023-05-04 LAB — ETHANOL: Alcohol, Ethyl (B): <10 mg/dL (ref <10)

## 2023-05-04 LAB — I-STAT CG4 LACTIC ACID, ED: Lactic Acid, Venous: 2.1 mmol/L (ref 0.5–1.9)

## 2023-05-04 MED ORDER — ETOMIDATE 2 MG/ML IV SOLN
INTRAVENOUS | Status: AC | PRN
Start: 2023-05-04 — End: 2023-05-04
  Administered 2023-05-04: 20 mg via INTRAVENOUS

## 2023-05-04 MED ORDER — IOHEXOL 350 MG/ML SOLN
75.0000 mL | Freq: Once | INTRAVENOUS | Status: AC | PRN
  Administered 2023-05-04: 75 mL via INTRAVENOUS

## 2023-05-04 MED ORDER — STROKE: EARLY STAGES OF RECOVERY BOOK
Freq: Once | Status: AC
  Filled 2023-05-04: qty 1

## 2023-05-04 MED ORDER — CLEVIDIPINE BUTYRATE 0.5 MG/ML IV EMUL
0.0000 mg/h | INTRAVENOUS | Status: DC
  Administered 2023-05-04: 2 mg/h via INTRAVENOUS

## 2023-05-04 MED ORDER — ACETAMINOPHEN 650 MG RE SUPP
650.0000 mg | RECTAL | Status: DC | PRN
  Filled 2023-05-04: qty 1

## 2023-05-04 MED ORDER — PANTOPRAZOLE SODIUM 40 MG IV SOLR
40.0000 mg | Freq: Every day | INTRAVENOUS | Status: DC
  Administered 2023-05-05: 40 mg via INTRAVENOUS
  Filled 2023-05-04: qty 10

## 2023-05-04 MED ORDER — ACETAMINOPHEN 160 MG/5ML PO SOLN
650.0000 mg | ORAL | Status: DC | PRN
  Administered 2023-05-06: 650 mg
  Filled 2023-05-04: qty 20.3

## 2023-05-04 MED ORDER — SUCCINYLCHOLINE CHLORIDE 20 MG/ML IJ SOLN
INTRAMUSCULAR | Status: AC | PRN
Start: 2023-05-04 — End: 2023-05-04
  Administered 2023-05-04: 140 mg via INTRAVENOUS

## 2023-05-04 MED ORDER — PROPOFOL 1000 MG/100ML IV EMUL
INTRAVENOUS | Status: DC | PRN
Start: 2023-05-04 — End: 2023-05-05
  Administered 2023-05-04: 40 ug/kg/min via INTRAVENOUS

## 2023-05-04 MED ORDER — FENTANYL BOLUS VIA INFUSION
25.0000 ug | INTRAVENOUS | Status: DC | PRN
  Administered 2023-05-04: 100 ug via INTRAVENOUS
  Administered 2023-05-04: 50 ug via INTRAVENOUS

## 2023-05-04 MED ORDER — SENNOSIDES-DOCUSATE SODIUM 8.6-50 MG PO TABS
1.0000 | ORAL_TABLET | Freq: Two times a day (BID) | ORAL | Status: DC
  Administered 2023-05-05 – 2023-05-07 (×5): 1
  Filled 2023-05-04 (×5): qty 1

## 2023-05-04 MED ORDER — FENTANYL 2500MCG IN NS 250ML (10MCG/ML) PREMIX INFUSION
25.0000 ug/h | INTRAVENOUS | Status: DC
  Administered 2023-05-04: 150 ug/h via INTRAVENOUS
  Filled 2023-05-04: qty 250

## 2023-05-04 MED ORDER — LABETALOL HCL 5 MG/ML IV SOLN
10.0000 mg | Freq: Once | INTRAVENOUS | Status: AC
  Administered 2023-05-04: 10 mg via INTRAVENOUS

## 2023-05-04 MED ORDER — ACETAMINOPHEN 325 MG PO TABS: 650.0000 mg | ORAL_TABLET | ORAL | Status: DC | PRN

## 2023-05-04 NOTE — Consult Note (Signed)
Reason for Consult: Level 1 trauma status post fall Referring Physician: Dr. Alonza Smoker Jeffrey Stout is an 72 y.o. male.  HPI: Patient is a unknown age male.  Per report patient fallen.  Reportedly on Plavix bleeding.  Per EMS patient had fallen at 4 PM.  Patient had reportedly had more confusion throughout the afternoon.  EMS was called.  Upon my arrival patient was in CT scanner.  Per Dr. Karene Fry patient's GCS on arrival was 41.  No past medical history on file.   No family history on file.  Social History:  has no history on file for tobacco use, alcohol use, and drug use.  Allergies: Not on File  Medications: unknown  Results for orders placed or performed during the hospital encounter of 05/04/23 (from the past 48 hour(s))  CBC     Status: Abnormal   Collection Time: 05/04/23 10:29 PM  Result Value Ref Range   WBC 14.1 (H) 4.0 - 10.5 K/uL   RBC 5.24 4.22 - 5.81 MIL/uL   Hemoglobin 16.9 13.0 - 17.0 g/dL   HCT 16.1 09.6 - 04.5 %   MCV 94.8 80.0 - 100.0 fL   MCH 32.3 26.0 - 34.0 pg   MCHC 34.0 30.0 - 36.0 g/dL   RDW 40.9 81.1 - 91.4 %   Platelets 179 150 - 400 K/uL   nRBC 0.0 0.0 - 0.2 %    Comment: Performed at [facility], [address], [place], [place] [address]  Protime-INR     Status: None   Collection Time: 05/04/23 10:29 PM  Result Value Ref Range   Prothrombin Time 14.2 11.4 - 15.2 seconds   INR 1.1 0.8 - 1.2    Comment: (NOTE) INR goal varies based on device and disease states. Performed at [facility], [address] [place], [place] [address]   Sample to Blood Bank     Status: None   Collection Time: 05/04/23 10:29 PM  Result Value Ref Range   Blood Bank Specimen SAMPLE AVAILABLE FOR TESTING    Sample Expiration      05/07/2023,2359 Performed at [facility], [address] [place], [place] [address]   I-Stat Chem 8, ED     Status: Abnormal   Collection Time: 05/04/23 10:36 PM  Result Value Ref Range   Sodium 139 135 -  145 mmol/L   Potassium 4.8 3.5 - 5.1 mmol/L   Chloride 104 98 - 111 mmol/L   BUN 19 8 - 23 mg/dL   Creatinine, Ser 4.69 (H) 0.61 - 1.24 mg/dL   Glucose, Bld 629 (H) 70 - 99 mg/dL    Comment: Glucose reference range applies only to samples taken after fasting for at least 8 hours.   Calcium, Ion 1.13 (L) 1.15 - 1.40 mmol/L   TCO2 23 22 - 32 mmol/L   Hemoglobin 16.7 13.0 - 17.0 g/dL   HCT 52.8 41.3 - 24.4 %  I-Stat Lactic Acid, ED     Status: Abnormal   Collection Time: 05/04/23 10:37 PM  Result Value Ref Range   Lactic Acid, Venous 2.1 (HH) 0.5 - 1.9 mmol/L   Comment NOTIFIED PHYSICIAN     No results found.  Review of Systems  Unable to perform ROS: Acuity of condition   Blood pressure (!) 220/115, pulse 89, temperature (!) 96 F (35.6 C), resp. rate 16, SpO2 100%. Physical Exam Constitutional:      Appearance: He is well-developed.  Comments: Conversant No acute distress  HENT:     Head: Normocephalic.      Comments: Abrasion as noted  Eyes:     General: Lids are normal. No scleral icterus.    Pupils: Pupils are equal, round, and reactive to light.     Comments: Pupils are equal round and reactive No lid lag Moist conjunctiva  Neck:     Thyroid: No thyromegaly.     Trachea: No tracheal tenderness.     Comments: No cervical lymphadenopathy Cardiovascular:     Rate and Rhythm: Normal rate and regular rhythm.     Heart sounds: No murmur heard. Pulmonary:     Effort: Pulmonary effort is normal.     Breath sounds: Normal breath sounds. No wheezing or rales.  Abdominal:     Tenderness: There is no abdominal tenderness.     Hernia: No hernia is present.  Musculoskeletal:     Cervical back: Normal range of motion and neck supple.  Skin:    General: Skin is warm.     Findings: No rash.     Nails: There is no clubbing.     Comments: Normal skin turgor  Neurological:     Mental Status: He is alert.     GCS: GCS eye subscore is 4. GCS verbal subscore is 1. GCS  motor subscore is 5.     Motor: Weakness (L arm and Left leg) present.     Comments: No gag reflex   Psychiatric:        Mood and Affect: Mood normal.        Thought Content: Thought content normal.        Judgment: Judgment normal.     Comments: Appropriate affect     Assessment/Plan: Unknown age male status post fall AVM/aneurysmal stroke   D/w Dr. Karene Fry and with neurologist  on call.  Appears that bleeds seems related to primary stroke. No traumatic injuries seen     Axel Filler 05/04/2023, 11:17 PM

## 2023-05-04 NOTE — ED Notes (Signed)
Intubation size 7 , 25 at the lip

## 2023-05-04 NOTE — Progress Notes (Signed)
Chaplain responded to Level 1 Trauma.  Pt receiving care.  No family present though wife may come if someone will bring her.   Chaplain on standby should family arrive or if otherwise needed.  Vernell Morgans Chaplain

## 2023-05-04 NOTE — H&P (Incomplete)
NEUROLOGY CONSULTATION NOTE   Date of service: May 04, 2023 Patient Name: Jeffrey Stout MRN:  [id number] DOB:  08/24/1876  _ _ _   _ __   _ __ _ _  __ __   _ __   __ _  History of Present Illness  Jeffrey Stout is a 72 y.o. male with PMH significant for HTN, HLD, tobacco abuse, renal mass s/p R nephrectomy and renal artery stenosis s/p stenting on aspirin and plavix who presents with aphasia and R sided weakness.  Patient's wife reports that around 1630, patient walked in from the backyard deck and had blood on the back of his head and both elbows. He would not talk to her at all and threw up. He did not seem to recall what had happened. He laid down on the couch and took a nap.  When he woke up from the nap, he was still unable to talk. He would look at her. He threw up again. Wife helped him get up and get to the bathroom. He was very wobbly and took a lot to get him to the bathroom. This was around 630PM.  Wife called EMS to check on him and he was brought in as a level 1 trauma to the ED.  In the ED, noted to have GCS of 12, not moving his R side, aphasic and L gaze preference. A code stroke was activated for concern for acute stroke vs ICH.  LKW: 1600 mRS: 0 tNKASE: not offered, 2/2 ICH Thrombectomy: not offered 2/2 ICH ICH score: 2 NIHSS components Score: Comment  1a Level of Conscious 0[]  1[]  2[x]  3[]      1b LOC Questions 0[]  1[]  2[x]       1c LOC Commands 0[]  1[]  2[x]       2 Best Gaze 0[]  1[]  2[x]       3 Visual 0[]  1[]  2[x]  3[]      4 Facial Palsy 0[x]  1[]  2[]  3[]      5a Motor Arm - left 0[x]  1[]  2[]  3[]  4[]  UN[]    5b Motor Arm - Right 0[]  1[]  2[]  3[]  4[x]  UN[]    6a Motor Leg - Left 0[x]  1[]  2[]  3[]  4[]  UN[]    6b Motor Leg - Right 0[]  1[x]  2[]  3[]  4[]  UN[]    7 Limb Ataxia 0[x]  1[]  2[]  3[]  UN[]     8 Sensory 0[]  1[]  2[x]  UN[]      9 Best Language 0[]  1[]  2[]  3[x]      10 Dysarthria 0[]  1[]  2[x]  UN[]      11 Extinct. and Inattention 0[x]  1[]  2[]       TOTAL:         ROS   Constitutional Denies weight loss, fever and chills. ***  HEENT Denies changes in vision and hearing. ***  Respiratory Denies SOB and cough. ***  CV Denies palpitations and CP ***  GI Denies abdominal pain, nausea, vomiting and diarrhea. ***  GU Denies dysuria and urinary frequency. ***  MSK Denies myalgia and joint pain. ***  Skin Denies rash and pruritus. ***  Neurological Denies headache and syncope. ***  Psychiatric Denies recent changes in mood. Denies anxiety and depression. ***   Past History  No past medical history on file. *** The histories are not reviewed yet. Please review them in the "History" navigator section and refresh this SmartLink. No family history on file. Social History   Socioeconomic History  . Marital status: Not on file    Spouse name: Not on file  .  Number of children: Not on file  . Years of education: Not on file  . Highest education level: Not on file  Occupational History  . Not on file  Tobacco Use  . Smoking status: Not on file  . Smokeless tobacco: Not on file  Substance and Sexual Activity  . Alcohol use: Not on file  . Drug use: Not on file  . Sexual activity: Not on file  Other Topics Concern  . Not on file  Social History Narrative  . Not on file   Social Determinants of Health   Financial Resource Strain: Not on file  Food Insecurity: Not on file  Transportation Needs: Not on file  Physical Activity: Not on file  Stress: Not on file  Social Connections: Not on file   Not on File  Medications  (Not in a hospital admission)    Vitals   Vitals:   05/04/23 2300 05/04/23 2310 05/04/23 2311 05/04/23 2313  BP: (!) 157/92 (!) 228/116 (!) 220/115   Pulse: 88 94 89 96  Resp: (!) 22 16 16  (!) 26  Temp:      SpO2: 100% 100% 100% 100%  Height:    6' (1.829 m)     There is no height or weight on file to calculate BMI.  Physical Exam   General: Laying comfortably in bed; in no acute distress. *** HENT:  Normal oropharynx and mucosa. Normal external appearance of ears and nose. *** Neck: Supple, no pain or tenderness *** CV: No JVD. No peripheral edema. *** Pulmonary: Symmetric Chest rise. Normal respiratory effort. *** Abdomen: Soft to touch, non-tender. *** Ext: No cyanosis, edema, or deformity *** Skin: No rash. Normal palpation of skin.  *** Musculoskeletal: Normal digits and nails by inspection. No clubbing. ***  Neurologic Examination  Mental status/Cognition: Alert, oriented to self, place, month and year, good attention. *** Speech/language: Fluent, comprehension intact, object naming intact, repetition intact. *** Cranial nerves:   CN II Pupils equal and reactive to light, no VF deficits ***   CN III,IV,VI EOM intact, no gaze preference or deviation, no nystagmus ***   CN V normal sensation in V1, V2, and V3 segments bilaterally ***   CN VII no asymmetry, no nasolabial fold flattening ***   CN VIII normal hearing to speech ***   CN IX & X normal palatal elevation, no uvular deviation ***   CN XI 5/5 head turn and 5/5 shoulder shrug bilaterally ***   CN XII midline tongue protrusion ***   Motor:  Muscle bulk: ***, tone ***, pronator drift *** tremor *** Mvmt Root Nerve  Muscle Right Left Comments  SA C5/6 Ax Deltoid     EF C5/6 Mc Biceps     EE C6/7/8 Rad Triceps     WF C6/7 Med FCR     WE C7/8 PIN ECU     F Ab C8/T1 U ADM/FDI     HF L1/2/3 Fem Illopsoas     KE L2/3/4 Fem Quad     DF L4/5 D Peron Tib Ant     PF S1/2 Tibial Grc/Sol      Reflexes:  Right Left Comments  Pectoralis      Biceps (C5/6)     Brachioradialis (C5/6)      Triceps (C6/7)      Patellar (L3/4)      Achilles (S1)      Hoffman      Plantar     Jaw jerk    Sensation:  Light touch    Pin prick    Temperature    Vibration   Proprioception    Coordination/Complex Motor:  - Finger to Nose *** - Heel to shin *** - Rapid alternating movement *** - Gait: Stride length ***. Arm swing ***.  Base width ***  Labs   CBC:  Recent Labs  Lab 05/04/23 2229 05/04/23 2236  WBC 14.1*  --   HGB 16.9 16.7  HCT 49.7 49.0  MCV 94.8  --   PLT 179  --     Basic Metabolic Panel:  Lab Results  Component Value Date   NA 139 05/04/2023   K 4.8 05/04/2023   CO2 23 05/04/2023   GLUCOSE 168 (H) 05/04/2023   BUN 19 05/04/2023   CREATININE 1.70 (H) 05/04/2023   CALCIUM 9.1 05/04/2023   GFRNONAA 28 (L) 05/04/2023   Lipid Panel: No results found for: "LDLCALC" HgbA1c: No results found for: "HGBA1C" Urine Drug Screen: No results found for: "LABOPIA", "COCAINSCRNUR", "LABBENZ", "AMPHETMU", "THCU", "LABBARB"  Alcohol Level     Component Value Date/Time   ETH <10 05/04/2023 2229    CT Head without contrast(Personally reviewed): ***  CT angio Head and Neck with contrast(Personally reviewed): ***  MR Angio head without contrast and Carotid Duplex BL(Personally reviewed): ***  MRI Brain(Personally reviewed): ***  rEEG:  ***  Impression   Jeffrey Stout is a 72 y.o. male with PMH significant for ***. His neurologic examination is notable for ***.  Primary Diagnosis:  {Cerebral Infarction:22351}  Secondary Diagnosis: {Stroke Comorbidities:21266}  Recommendations  *** ______________________________________________________________________   Thank you for the opportunity to take part in the care of this patient. If you have any further questions, please contact the neurology consultation attending.  Signed,  Erick Blinks [facility] _ _ _   _ __   _ __ _ _  __ __   _ __   __ _

## 2023-05-04 NOTE — ED Triage Notes (Signed)
56 male fall on back deck at 4 pm.  Loc and vomit after fall abrasion on back of head. less responsive with family called ems. Can't move right side. Not following commands. Patient on Plavix. 85%ra Ronchi  6l 99% 82 hr  Bp 145/86

## 2023-05-04 NOTE — Progress Notes (Signed)
Orthopedic Tech Progress Note Patient Details:  Jeffrey Stout 08/24/1876 [id number]  Patient ID: Jeffrey Stout, male   DOB: 08/24/1876, 73 y.o.   MRN: [id number] Level I; not currently needed. Jeffrey Stout 05/04/2023, 10:50 PM

## 2023-05-04 NOTE — ED Provider Notes (Signed)
EMERGENCY DEPARTMENT AT [facility] Provider Note   CSN: [id number] Arrival date & time: 05/04/23  2221     History  No chief complaint on file.   Jeffrey Stout is a 72 y.o. male.  HPI   Unknown male presenting to the emergency department as a level 1 trauma due to a fall off of a porch.  Per EMS, the patient fell around 4 PM.  He had loss of consciousness and 1 vomiting on scene.  Per family he was less responsive and they called EMS.  He is on Plavix.  He was not moving his right side and not following commands per EMS.  O2 sats with EMS were 85% on room air, the patient arrived to the emergency department ABC intact, hemodynamically stable, due to right hemibody deficits, concern for possible stroke as the etiology of the patient's initial fall, code stroke was also called on patient arrival.  Patient was GCS 11 on arrival, purposeful movements in the left hemibody, looking to the left, following commands on the left, aphasic, right hemibody deficits noted.  Bilateral breath sounds and hemodynamically stable on arrival.  The patient was taken to the CT scanner for stroke and trauma imaging and was found to have a large head bleed and was subsequently emergently intubated and neurosurgery was consulted.  Home Medications Prior to Admission medications   Not on File      Allergies    Patient has no allergy information on record.    Review of Systems   Review of Systems  Unable to perform ROS: Acuity of condition    Physical Exam Updated Vital Signs BP (!) 157/92   Pulse 88   Temp (!) 96 F (35.6 C)   Resp (!) 22   SpO2 100%  Physical Exam Vitals and nursing note reviewed.  Constitutional:      General: He is in acute distress.     Appearance: He is well-developed.     Comments: Elderly appearing male, aphasic, GCS 11  HENT:     Head: Normocephalic.     Comments: Abrasion to the posterior left occiput Eyes:     Conjunctiva/sclera:  Conjunctivae normal.     Pupils: Pupils are equal, round, and reactive to light.  Neck:     Comments: C-collar in place Cardiovascular:     Rate and Rhythm: Normal rate and regular rhythm.  Pulmonary:     Effort: Pulmonary effort is normal. No respiratory distress.     Breath sounds: Normal breath sounds.  Chest:     Comments: Chest wall stable and non-tender to AP and lateral compression. Clavicles stable and non-tender to AP compression Abdominal:     General: There is no distension.     Palpations: Abdomen is soft.     Tenderness: There is no abdominal tenderness. There is no guarding.     Comments: Pelvis stable to lateral compression.  Musculoskeletal:        General: No deformity or signs of injury.     Cervical back: Neck supple.     Comments: No stepoffs or deformities. Extremities atraumatic with intact ROM.   Skin:    General: Skin is warm and dry.     Findings: No lesion or rash.  Neurological:     Mental Status: He is alert.     Comments: No clear facial droop, intact corneals and gag, gaze preference to the left, right upper extremity flaccid, did not respond to painful stimuli,  right lower extremity antigravity 3 out of 5, left hemibody 5 out of 5 strength, purposeful movements in the left hemibody     ED Results / Procedures / Treatments   Labs (all labs ordered are listed, but only abnormal results are displayed) Labs Reviewed  CBC - Abnormal; Notable for the following components:      Result Value   WBC 14.1 (*)    All other components within normal limits  I-STAT CHEM 8, ED - Abnormal; Notable for the following components:   Creatinine, Ser 1.70 (*)    Glucose, Bld 168 (*)    Calcium, Ion 1.13 (*)    All other components within normal limits  I-STAT CG4 LACTIC ACID, ED - Abnormal; Notable for the following components:   Lactic Acid, Venous 2.1 (*)    All other components within normal limits  PROTIME-INR  COMPREHENSIVE METABOLIC PANEL  ETHANOL   URINALYSIS, ROUTINE W REFLEX MICROSCOPIC  SAMPLE TO BLOOD BANK    EKG None  Radiology No results found.  Procedures .Critical Care  Performed by: Ernie Avena, MD Authorized by: Ernie Avena, MD   Critical care provider statement:    Critical care time (minutes):  47   Critical care was necessary to treat or prevent imminent or life-threatening deterioration of the following conditions:  CNS failure or compromise   Critical care was time spent personally by me on the following activities:  Development of treatment plan with patient or surrogate, discussions with consultants, evaluation of patient's response to treatment, examination of patient, ordering and review of laboratory studies, ordering and review of radiographic studies, ordering and performing treatments and interventions, pulse oximetry, re-evaluation of patient's condition and review of old charts   Care discussed with: admitting provider   Procedure Name: Intubation Date/Time: 05/04/2023 11:09 PM  Performed by: Ernie Avena, MDPre-anesthesia Checklist: Patient identified, Patient being monitored, Emergency Drugs available, Timeout performed and Suction available Oxygen Delivery Method: Non-rebreather mask Preoxygenation: Pre-oxygenation with 100% oxygen Induction Type: Rapid sequence Ventilation: Mask ventilation without difficulty Laryngoscope Size: Glidescope and 3 Grade View: Grade I Tube size: 7.5 mm Number of attempts: 1 Placement Confirmation: ETT inserted through vocal cords under direct vision, CO2 detector and Breath sounds checked- equal and bilateral Secured at: 23 cm Tube secured with: ETT holder        Medications Ordered in ED Medications  clevidipine (CLEVIPREX) infusion 0.5 mg/mL (2 mg/hr Intravenous New Bag/Given 05/04/23 2307)  etomidate (AMIDATE) injection (20 mg Intravenous Given 05/04/23 2303)  succinylcholine (ANECTINE) injection (140 mg Intravenous Given 05/04/23 2303)  propofol  (DIPRIVAN) 1000 MG/100ML infusion (40 mcg/kg/min  90 kg (Order-Specific) Intravenous New Bag/Given 05/04/23 2306)  labetalol (NORMODYNE) injection 10 mg (10 mg Intravenous Given 05/04/23 2251)  iohexol (OMNIPAQUE) 350 MG/ML injection 75 mL (75 mLs Intravenous Contrast Given 05/04/23 2302)    ED Course/ Medical Decision Making/ A&P                                 Medical Decision Making Amount and/or Complexity of Data Reviewed Labs: ordered. Radiology: ordered.  Risk Prescription drug management.     Unknown male presenting to the emergency department as a level 1 trauma due to a fall off of a porch.  Per EMS, the patient fell around 4 PM.  He had loss of consciousness and 1 vomiting on scene.  Per family he was less responsive and they called EMS.  He  is on Plavix.  He was not moving his right side and not following commands per EMS.  O2 sats with EMS were 85% on room air, the patient arrived to the emergency department ABC intact, hemodynamically stable, due to right hemibody deficits, concern for possible stroke as the etiology of the patient's initial fall, code stroke was also called on patient arrival.  Patient was GCS 11 on arrival, purposeful movements in the left hemibody, looking to the left, following commands on the left, aphasic, right hemibody deficits noted.  Bilateral breath sounds and hemodynamically stable on arrival.  The patient was taken to the CT scanner for stroke and trauma imaging and was found to have a large head bleed and was subsequently emergently intubated and neurosurgery was consulted.  Neuro and trauma bedside in the CT scanner. Pt found to have a large left ICH. Emergently intubated and started on Cleviprex. Neurosurgery consulted.   ***  {Document critical care time when appropriate:1} {Document review of labs and clinical decision tools ie heart score, Chads2Vasc2 etc:1}  {Document your independent review of radiology images, and any outside  records:1} {Document your discussion with family members, caretakers, and with consultants:1} {Document social determinants of health affecting pt's care:1} {Document your decision making why or why not admission, treatments were needed:1} Final Clinical Impression(s) / ED Diagnoses Final diagnoses:  None    Rx / DC Orders ED Discharge Orders     None

## 2023-05-04 NOTE — H&P (Signed)
NEUROLOGY CONSULTATION NOTE   Date of service: May 04, 2023 Patient Name: Jeffrey Stout MRN:  [id number] DOB:  08/24/1876  _ _ _   _ __   _ __ _ _  __ __   _ __   __ _  History of Present Illness  Jeffrey Stout is a 72 y.o. male with PMH significant for HTN, HLD, tobacco abuse, renal mass s/p R nephrectomy and renal artery stenosis s/p stenting on aspirin and plavix who presents with aphasia and R sided weakness.  Patient's wife reports that around 1630, patient walked in from the backyard deck and had blood on the back of his head and both elbows. He would not talk to her at all and threw up. He did not seem to recall what had happened. He laid down on the couch and took a nap.  When he woke up from the nap, he was still unable to talk. He would look at her. He threw up again. Wife helped him get up and get to the bathroom. He was very wobbly and took a lot to get him to the bathroom. This was around 630PM.  Wife worried about fall specially with the bleeding from the back of his head. He takes aspirin and plavix for kidney stent but not on AC.  Wife called EMS to check on him and he was brought in as a level 1 trauma to the ED.  In the ED, noted to have GCS of 12, not moving his R side, aphasic and L gaze preference. A code stroke was activated for concern for acute stroke vs ICH.  LKW: 1600 mRS: 0 tNKASE: not offered, 2/2 ICH Thrombectomy: not offered 2/2 ICH ICH score: 2 NIHSS components Score: Comment  1a Level of Conscious 0[]  1[]  2[x]  3[]      1b LOC Questions 0[]  1[]  2[x]       1c LOC Commands 0[]  1[]  2[x]       2 Best Gaze 0[]  1[]  2[x]       3 Visual 0[]  1[]  2[x]  3[]      4 Facial Palsy 0[x]  1[]  2[]  3[]      5a Motor Arm - left 0[x]  1[]  2[]  3[]  4[]  UN[]    5b Motor Arm - Right 0[]  1[]  2[]  3[]  4[x]  UN[]    6a Motor Leg - Left 0[x]  1[]  2[]  3[]  4[]  UN[]    6b Motor Leg - Right 0[]  1[x]  2[]  3[]  4[]  UN[]    7 Limb Ataxia 0[x]  1[]  2[]  3[]  UN[]     8 Sensory 0[]  1[]  2[x]   UN[]      9 Best Language 0[]  1[]  2[]  3[x]      10 Dysarthria 0[]  1[]  2[x]  UN[]      11 Extinct. and Inattention 0[x]  1[]  2[]       TOTAL:        ROS   Unable to obtain 2/2 aphasia and intubation  Past History  No past medical history on file.  No family history on file. Social History   Socioeconomic History   Marital status: Not on file    Spouse name: Not on file   Number of children: Not on file   Years of education: Not on file   Highest education level: Not on file  Occupational History   Not on file  Tobacco Use   Smoking status: Not on file   Smokeless tobacco: Not on file  Substance and Sexual Activity   Alcohol use: Not on file   Drug use: Not on file  Sexual activity: Not on file  Other Topics Concern   Not on file  Social History Narrative   Not on file   Social Determinants of Health   Financial Resource Strain: Not on file  Food Insecurity: Not on file  Transportation Needs: Not on file  Physical Activity: Not on file  Stress: Not on file  Social Connections: Not on file   Not on File  Medications  (Not in a hospital admission)    Vitals   Vitals:   05/04/23 2300 05/04/23 2310 05/04/23 2311 05/04/23 2313  BP: (!) 157/92 (!) 228/116 (!) 220/115   Pulse: 88 94 89 96  Resp: (!) 22 16 16  (!) 26  Temp:      SpO2: 100% 100% 100% 100%  Height:    6' (1.829 m)     There is no height or weight on file to calculate BMI.  Physical Exam   General: Laying comfortably in bed; in no acute distress.  HENT: Normal oropharynx and mucosa. Normal external appearance of ears and nose.  Neck: Supple, no pain or tenderness  CV: No JVD. No peripheral edema.  Pulmonary: Symmetric Chest rise. Normal respiratory effort.  Abdomen: Soft to touch, non-tender.  Ext: No cyanosis, edema, or deformity  Skin: No rash. Normal palpation of skin.   Musculoskeletal: Normal digits and nails by inspection. No clubbing.   Neurologic Examination  Mental  status/Cognition: opens eyes to voice, makes eye contact on left. Does not answer any orientation questions. Speech/language: mute, global aphasia, does not follow commands or answer any questions. Cranial nerves:   CN II Pupils equal and reactive to light, does not blink to threat on the right.   CN III,IV,VI L gaze preference. In C collar so did not attemp dolls eyes.   CN V Corneals intact BL   CN VII no asymmetry, no nasolabial fold flattening   CN VIII Makes eye contact o voice on the left.   CN IX & X No cough   CN XI Head midline due to C collar.   CN XII midline tongue   Motor:  Muscle bulk: normal, tone increased mildly in all extremities. Hold LUE and LLE off the bed without any drift for more than 10 secs. No movement in RUE RLE drifts down to the bed when held up off the bed.  Sensation:  Light touch    Pin prick Localizes in LUE and LLE but not in RUE and RLE   Temperature    Vibration   Proprioception    Coordination/Complex Motor:  Unable to assess.  Labs   CBC:  Recent Labs  Lab 05/04/23 2229 05/04/23 2236  WBC 14.1*  --   HGB 16.9 16.7  HCT 49.7 49.0  MCV 94.8  --   PLT 179  --     Basic Metabolic Panel:  Lab Results  Component Value Date   NA 139 05/04/2023   K 4.8 05/04/2023   CO2 23 05/04/2023   GLUCOSE 168 (H) 05/04/2023   BUN 19 05/04/2023   CREATININE 1.70 (H) 05/04/2023   CALCIUM 9.1 05/04/2023   GFRNONAA 28 (L) 05/04/2023   Lipid Panel: No results found for: "LDLCALC" HgbA1c: No results found for: "HGBA1C" Urine Drug Screen: No results found for: "LABOPIA", "COCAINSCRNUR", "LABBENZ", "AMPHETMU", "THCU", "LABBARB"  Alcohol Level     Component Value Date/Time   ETH <10 05/04/2023 2229    CT Head without contrast(Personally reviewed): 1. A 60 mL intraparenchymal hematoma centered within the  left corona radiata with extension into the overlying subarachnoid space. Lobar location in combination with patient age raises suspicion  for amyloid angiopathy. If rightward midline shift of approximately 6 mm. 2. Small focus of hemorrhage along the right superior temporal gyrus, measuring 6 mm. Multifocal hemorrhage means metastatic disease must also be considered.   CT angio Head and Neck with contrast(Personally reviewed): No emergent large vessel occlusion or hemodynamically significant stenosis of the head or neck. No aneurysm or vascular malformation  MRI Brain: pending  rEEG:  pending  Impression   Jeffrey Stout is a 72 y.o. male with PMH significant for HTN, HLD, tobacco abuse, renal mass s/p R nephrectomy and renal artery stenosis s/p stenting on aspirin and plavix who presents with aphasia and R sided weakness and ? fall.  He was found to have large L Temporal ICH with some SAH along with a small R superior temporal gyrus hemorrhage.  Etiology is unclear.certainly could be bleed associated with cerebral amyloid angiopathy. No prior imaging to compare. Could also potentially be metastatic renal cell carcinoma specially given his hx of R renal mass s/p nephrectomy. RCC mets have a high potential to bleed.  Recommendations  Large L temporal ICH with small SAH with mass effect and 6mm MLS: - Admit to ICU - Stability scan in 6 hours or STAT with any neurological decline - Frequent neuro checks; q44min for 1 hour, then q1hour - No antiplatelets or anticoagulants due to ICH - SCD for DVT prophylaxis, pharmacological DVT ppx at 24 hours if ICH is stable - Blood pressure control with goal systolic 130 - 150, cleverplex and labetalol PRN - Stroke labs, HgbA1c, fasting lipid panel - MRI brain with and without contrast when stabilized to evaluate for underlying mass - CTA head and neck - Risk factor modification - Echocardiogram - PT consult, OT consult, Speech consult. - Stroke team to follow - rEEG. - Hypertonic saline with goal sodium of 150-155.  Peripheral artery disease on DAPT: - hold  antiplatelet in the setting of ICH  HTN: Hold home meds. Will use PRN Cleviprex and labetalol for SBP above goal.  This patient is critically ill and at significant risk of neurological worsening, death and care requires constant monitoring of vital signs, hemodynamics,respiratory and cardiac monitoring, neurological assessment, discussion with family, other specialists and medical decision making of high complexity. I spent 70 minutes of neurocritical care time  in the care of  this patient. This was time spent independent of any time provided by nurse practitioner or PA.  Erick Blinks [facility] 05/05/2023  12:45 AM  ______________________________________________________________________   Thank you for the opportunity to take part in the care of this patient. If you have any further questions, please contact the neurology consultation attending.  Signed,  Erick Blinks [facility] _ _ _   _ __   _ __ _ _  __ __   _ __   __ _

## 2023-05-05 ENCOUNTER — Encounter: Payer: Self-pay | Admitting: Critical Care Medicine

## 2023-05-05 ENCOUNTER — Inpatient Hospital Stay (HOSPITAL_COMMUNITY): Payer: Medicare Other

## 2023-05-05 ENCOUNTER — Encounter (HOSPITAL_COMMUNITY): Payer: Self-pay | Admitting: Neurology

## 2023-05-05 DIAGNOSIS — Z9911 Dependence on respirator [ventilator] status: Secondary | ICD-10-CM | POA: Diagnosis not present

## 2023-05-05 DIAGNOSIS — I619 Nontraumatic intracerebral hemorrhage, unspecified: Secondary | ICD-10-CM

## 2023-05-05 DIAGNOSIS — R569 Unspecified convulsions: Secondary | ICD-10-CM

## 2023-05-05 DIAGNOSIS — J9601 Acute respiratory failure with hypoxia: Secondary | ICD-10-CM

## 2023-05-05 DIAGNOSIS — S06359A Traumatic hemorrhage of left cerebrum with loss of consciousness of unspecified duration, initial encounter: Secondary | ICD-10-CM

## 2023-05-05 DIAGNOSIS — G9341 Metabolic encephalopathy: Secondary | ICD-10-CM

## 2023-05-05 DIAGNOSIS — Z978 Presence of other specified devices: Secondary | ICD-10-CM

## 2023-05-05 DIAGNOSIS — I610 Nontraumatic intracerebral hemorrhage in hemisphere, subcortical: Principal | ICD-10-CM

## 2023-05-05 DIAGNOSIS — G935 Compression of brain: Secondary | ICD-10-CM

## 2023-05-05 DIAGNOSIS — N179 Acute kidney failure, unspecified: Secondary | ICD-10-CM

## 2023-05-05 DIAGNOSIS — I6389 Other cerebral infarction: Secondary | ICD-10-CM

## 2023-05-05 DIAGNOSIS — I952 Hypotension due to drugs: Secondary | ICD-10-CM

## 2023-05-05 LAB — POCT I-STAT 7, (LYTES, BLD GAS, ICA,H+H)
Acid-base deficit: 4 mmol/L — ABNORMAL HIGH (ref 0.0–2.0)
Acid-base deficit: 2 mmol/L (ref 0.0–2.0)
Bicarbonate: 23.6 mmol/L (ref 20.0–28.0)
Bicarbonate: 22.2 mmol/L (ref 20.0–28.0)
Calcium, Ion: 1.19 mmol/L (ref 1.15–1.40)
Calcium, Ion: 1.16 mmol/L (ref 1.15–1.40)
HCT: 46 % (ref 39.0–52.0)
HCT: 42 % (ref 39.0–52.0)
Hemoglobin: 15.6 g/dL (ref 13.0–17.0)
Hemoglobin: 14.3 g/dL (ref 13.0–17.0)
O2 Saturation: 99 %
O2 Saturation: 99 %
Patient temperature: 98
Patient temperature: 98.6
Potassium: 4.6 mmol/L (ref 3.5–5.1)
Potassium: 4.6 mmol/L (ref 3.5–5.1)
Sodium: 137 mmol/L (ref 135–145)
Sodium: 138 mmol/L (ref 135–145)
TCO2: 25 mmol/L (ref 22–32)
TCO2: 23 mmol/L (ref 22–32)
pCO2 arterial: 48.7 mmHg — ABNORMAL HIGH (ref 32–48)
pCO2 arterial: 36.7 mmHg (ref 32–48)
pH, Arterial: 7.293 — ABNORMAL LOW (ref 7.35–7.45)
pH, Arterial: 7.389 (ref 7.35–7.45)
pO2, Arterial: 149 mmHg — ABNORMAL HIGH (ref 83–108)
pO2, Arterial: 121 mmHg — ABNORMAL HIGH (ref 83–108)

## 2023-05-05 LAB — URINALYSIS, ROUTINE W REFLEX MICROSCOPIC
Bacteria, UA: NONE SEEN
Bilirubin Urine: NEGATIVE
Glucose, UA: NEGATIVE mg/dL
Ketones, ur: 5 mg/dL — AB
Leukocytes,Ua: NEGATIVE
Nitrite: NEGATIVE
Protein, ur: 30 mg/dL — AB
RBC / HPF: >50 RBC/hpf (ref 0–5)
Specific Gravity, Urine: 1.036 — ABNORMAL HIGH (ref 1.005–1.030)
pH: 5 (ref 5.0–8.0)

## 2023-05-05 LAB — CBC
HCT: 43.2 % (ref 39.0–52.0)
Hemoglobin: 15 g/dL (ref 13.0–17.0)
MCH: 33.6 pg (ref 26.0–34.0)
MCHC: 34.7 g/dL (ref 30.0–36.0)
MCV: 96.9 fL (ref 80.0–100.0)
Platelets: 103 10*3/uL — ABNORMAL LOW (ref 150–400)
RBC: 4.46 MIL/uL (ref 4.22–5.81)
RDW: 12.6 % (ref 11.5–15.5)
WBC: 9.1 10*3/uL (ref 4.0–10.5)
nRBC: 0 % (ref 0.0–0.2)

## 2023-05-05 LAB — SAMPLE TO BLOOD BANK

## 2023-05-05 LAB — SODIUM
Sodium: 139 mmol/L (ref 135–145)
Sodium: 153 mmol/L — ABNORMAL HIGH (ref 135–145)

## 2023-05-05 LAB — ECHOCARDIOGRAM COMPLETE
Height: 72.008 in
S' Lateral: 3.4 cm
Weight: 2786.61 [oz_av]

## 2023-05-05 LAB — GLUCOSE, CAPILLARY
Glucose-Capillary: 120 mg/dL — ABNORMAL HIGH (ref 70–99)
Glucose-Capillary: 129 mg/dL — ABNORMAL HIGH (ref 70–99)
Glucose-Capillary: 143 mg/dL — ABNORMAL HIGH (ref 70–99)
Glucose-Capillary: 145 mg/dL — ABNORMAL HIGH (ref 70–99)
Glucose-Capillary: 147 mg/dL — ABNORMAL HIGH (ref 70–99)
Glucose-Capillary: 155 mg/dL — ABNORMAL HIGH (ref 70–99)
Glucose-Capillary: 168 mg/dL — ABNORMAL HIGH (ref 70–99)

## 2023-05-05 LAB — RAPID URINE DRUG SCREEN, HOSP PERFORMED
Amphetamines: NOT DETECTED
Barbiturates: NOT DETECTED
Benzodiazepines: NOT DETECTED
Cocaine: NOT DETECTED
Opiates: NOT DETECTED
Tetrahydrocannabinol: NOT DETECTED

## 2023-05-05 LAB — MAGNESIUM: Magnesium: 2.1 mg/dL (ref 1.7–2.4)

## 2023-05-05 LAB — PHOSPHORUS: Phosphorus: 2.7 mg/dL (ref 2.5–4.6)

## 2023-05-05 LAB — BASIC METABOLIC PANEL WITH GFR
Anion gap: 9 (ref 5–15)
BUN: 17 mg/dL (ref 8–23)
CO2: 21 mmol/L — ABNORMAL LOW (ref 22–32)
Calcium: 8.5 mg/dL — ABNORMAL LOW (ref 8.9–10.3)
Chloride: 114 mmol/L — ABNORMAL HIGH (ref 98–111)
Creatinine, Ser: 1.56 mg/dL — ABNORMAL HIGH (ref 0.61–1.24)
GFR, Estimated: 47 mL/min — ABNORMAL LOW
Glucose, Bld: 134 mg/dL — ABNORMAL HIGH (ref 70–99)
Potassium: 4 mmol/L (ref 3.5–5.1)
Sodium: 144 mmol/L (ref 135–145)

## 2023-05-05 LAB — MRSA NEXT GEN BY PCR, NASAL: MRSA by PCR Next Gen: NOT DETECTED

## 2023-05-05 LAB — LACTIC ACID, PLASMA: Lactic Acid, Venous: 1.6 mmol/L (ref 0.5–1.9)

## 2023-05-05 MED ORDER — PANTOPRAZOLE SODIUM 40 MG IV SOLR
40.0000 mg | Freq: Every day | INTRAVENOUS | Status: DC
  Administered 2023-05-06 – 2023-05-07 (×2): 40 mg via INTRAVENOUS
  Filled 2023-05-05 (×2): qty 10

## 2023-05-05 MED ORDER — FENTANYL 2500MCG IN NS 250ML (10MCG/ML) PREMIX INFUSION
0.0000 ug/h | INTRAVENOUS | Status: DC
  Administered 2023-05-05: 50 ug/h via INTRAVENOUS
  Administered 2023-05-06: 75 ug/h via INTRAVENOUS
  Filled 2023-05-05: qty 250

## 2023-05-05 MED ORDER — PROPOFOL 1000 MG/100ML IV EMUL: 0.0000 ug/kg/min | INTRAVENOUS | Status: DC

## 2023-05-05 MED ORDER — FENTANYL BOLUS VIA INFUSION: 25.0000 ug | INTRAVENOUS | Status: DC | PRN

## 2023-05-05 MED ORDER — DOCUSATE SODIUM 50 MG/5ML PO LIQD
100.0000 mg | Freq: Two times a day (BID) | ORAL | Status: DC
  Administered 2023-05-05: 100 mg
  Filled 2023-05-05: qty 10

## 2023-05-05 MED ORDER — FAMOTIDINE 20 MG PO TABS: 20.0000 mg | ORAL_TABLET | Freq: Two times a day (BID) | ORAL | Status: DC

## 2023-05-05 MED ORDER — ORAL CARE MOUTH RINSE: 15.0000 mL | OROMUCOSAL | Status: DC | PRN

## 2023-05-05 MED ORDER — CHLORHEXIDINE GLUCONATE CLOTH 2 % EX PADS
6.0000 | MEDICATED_PAD | Freq: Every day | CUTANEOUS | Status: DC
  Administered 2023-05-05 – 2023-05-06 (×2): 6 via TOPICAL

## 2023-05-05 MED ORDER — SODIUM CHLORIDE 3 % IV BOLUS
250.0000 mL | Freq: Once | INTRAVENOUS | Status: AC
  Administered 2023-05-05: 250 mL via INTRAVENOUS
  Filled 2023-05-05: qty 500

## 2023-05-05 MED ORDER — POTASSIUM CHLORIDE 20 MEQ PO PACK
40.0000 meq | PACK | Freq: Once | ORAL | Status: AC
  Administered 2023-05-05: 40 meq
  Filled 2023-05-05: qty 2

## 2023-05-05 MED ORDER — ORAL CARE MOUTH RINSE
15.0000 mL | OROMUCOSAL | Status: DC
  Administered 2023-05-05 – 2023-05-07 (×30): 15 mL via OROMUCOSAL

## 2023-05-05 MED ORDER — INSULIN ASPART 100 UNIT/ML IJ SOLN
0.0000 [IU] | INTRAMUSCULAR | Status: DC
  Administered 2023-05-05: 2 [IU] via SUBCUTANEOUS
  Administered 2023-05-05: 1 [IU] via SUBCUTANEOUS
  Administered 2023-05-05: 2 [IU] via SUBCUTANEOUS
  Administered 2023-05-05 – 2023-05-06 (×4): 1 [IU] via SUBCUTANEOUS
  Administered 2023-05-06: 2 [IU] via SUBCUTANEOUS
  Administered 2023-05-06 – 2023-05-07 (×4): 1 [IU] via SUBCUTANEOUS

## 2023-05-05 MED ORDER — SODIUM CHLORIDE 0.9 % IV SOLN
3.0000 g | Freq: Four times a day (QID) | INTRAVENOUS | Status: DC
  Administered 2023-05-05 – 2023-05-07 (×10): 3 g via INTRAVENOUS
  Filled 2023-05-05 (×10): qty 8

## 2023-05-05 MED ORDER — ORAL CARE MOUTH RINSE
15.0000 mL | OROMUCOSAL | Status: DC
  Administered 2023-05-05 (×3): 15 mL via OROMUCOSAL

## 2023-05-05 MED ORDER — PANTOPRAZOLE SODIUM 40 MG IV SOLR
40.0000 mg | Freq: Two times a day (BID) | INTRAVENOUS | Status: DC
  Administered 2023-05-05: 40 mg via INTRAVENOUS
  Filled 2023-05-05: qty 10

## 2023-05-05 MED ORDER — FENTANYL CITRATE PF 50 MCG/ML IJ SOSY: 25.0000 ug | PREFILLED_SYRINGE | INTRAMUSCULAR | Status: DC | PRN

## 2023-05-05 MED ORDER — SODIUM CHLORIDE 3 % IV SOLN
INTRAVENOUS | Status: DC
  Filled 2023-05-05 (×3): qty 500

## 2023-05-05 MED ORDER — SODIUM CHLORIDE 0.9 % IV BOLUS
1000.0000 mL | Freq: Once | INTRAVENOUS | Status: AC
  Administered 2023-05-05: 1000 mL via INTRAVENOUS

## 2023-05-05 MED ORDER — GADOBUTROL 1 MMOL/ML IV SOLN
8.0000 mL | Freq: Once | INTRAVENOUS | Status: AC | PRN
  Administered 2023-05-05: 8 mL via INTRAVENOUS

## 2023-05-05 MED ORDER — LACTATED RINGERS IV BOLUS
1000.0000 mL | Freq: Once | INTRAVENOUS | Status: DC
Start: 2023-05-05 — End: 2023-05-05

## 2023-05-05 MED ORDER — POLYETHYLENE GLYCOL 3350 17 G PO PACK
17.0000 g | PACK | Freq: Every day | ORAL | Status: DC
  Administered 2023-05-05 – 2023-05-07 (×3): 17 g
  Filled 2023-05-05 (×3): qty 1

## 2023-05-05 MED ORDER — SODIUM CHLORIDE 0.9% IV SOLUTION: Freq: Once | INTRAVENOUS | Status: DC

## 2023-05-05 NOTE — Consult Note (Signed)
CC: ICH  HPI:     Patient is a 72 y.o. male presents with increased confusion and somnolence since this afternoon after a fall from his deck, per family. They note he is also on Plavix. No add'l history able to be obtained.     Patient Active Problem List   Diagnosis Date Noted   ICH (intracerebral hemorrhage) (HCC) 05/20/2023   No past medical history on file.    (Not in a hospital admission)  Not on File  Social History   Tobacco Use   Smoking status: Not on file   Smokeless tobacco: Not on file  Substance Use Topics   Alcohol use: Not on file    No family history on file.     Objective:   Patient Vitals for the past 8 hrs:  BP Temp Pulse Resp SpO2 Height  05/24/2023 2325 (!) 82/65 -- 94 (!) 21 100 % --  05/10/2023 2313 -- -- 96 (!) 26 100 % 6' (1.829 m)  04/26/2023 2311 (!) 220/115 -- 89 16 100 % --  04/28/2023 2310 (!) 228/116 -- 94 16 100 % --  05/01/2023 2300 (!) 157/92 -- 88 (!) 22 100 % --  05/02/2023 2249 (!) 158/88 -- -- -- -- --  05/05/2023 2230 (!) 165/104 -- 89 19 100 % --  05/15/2023 2228 138/80 (!) 96 F (35.6 C) 89 (!) 23 99 % --   No intake/output data recorded. No intake/output data recorded.   Intubated, sedated Pupils 1mm bilaterally, minimally reactive Moving LUE/LLE spontaneously.  Minimal response to pain Positive corneals bilaterally Positive cough/gag  Assessment:   This is a 72 yo male who is presumably on Plavix with multifocal hemorrhage including an acute L sided intraparenchymal hemorrhage with additional hemorrhage along R superior temporal gyrus.   Plan:   -Recommend continued hypertonic NaCl with Na goal of 145-150 -Elevate HOB -Repeat CTH in 6-8H -MRI w/ and w/o  Call w/ questions/concerns. NSGY to continue to follow.  Rylynn Kobs Margaree Mackintosh, PA-C

## 2023-05-05 NOTE — Plan of Care (Signed)
CCM NP and DO were requested to place CVC to facilitate hypertonic saline administration.   We have both reviewed latest data and have both discussed this with our pharmacy team-- HTS bolus can be administered through PIV, and continuous infusion following can be administered through PIV as well.   At this time there is no indication for CVC for HTS administration.     Tessie Fass MSN, AGACNP-BC [facility] 05/05/2023, 1:59 AM

## 2023-05-05 NOTE — Consult Note (Signed)
NAME:  Jeffrey Stout, MRN:  [id number], DOB:  1951-04-18, LOS: 1 ADMISSION DATE:  05/04/2023, CONSULTATION DATE:  05/05/23 REFERRING MD:  Derry Lory , CHIEF COMPLAINT:  R sided weakness, aphasia    History of Present Illness:  72 yo M PMH long standing smoking hx, HTN, HLD, renal mass s/p R nephrectomy, RAS s/p stenting now on DAPT presented to ED w aphasia and R sided wkness late 9/10.   9/10 1630 pt came inside from the backyard with blood on his head and elbows, couldn't recall what happened, threw up and took a nap on the couch. Woke up around 1830 and was aphasic, threw up again, was very unsteady on his feet. Wife helped him to bathroom at this time.   After conferring with a family friend, wife called EMS concerned that the blood on his head and elbows + subsequent mental status changes might have been from a fall.   Presented as Level 1 in this setting. GCS 11, R hemiparesis, aphasic, L gaze preference prompting code stroke. CT H revealed large L temporal ICH + some SAH + small R superior temporal gyrus hemorrhage. He was intubated in this setting. NSGY and PCCM consulted.     Pertinent  Medical History  HTN HLD Tobacco use RAS s/p stent On DAPT S/p R nephrectomy   Significant Hospital Events: Including procedures, antibiotic start and stop dates in addition to other pertinent events   9/10 ED after likely fall, LOC, AMS --> large ICH. Intubated.   Interim History / Subjective:  Intubated in ED   Objective   Blood pressure 118/68, pulse 80, temperature 100 F (37.8 C), resp. rate 15, height 6' 0.01" (1.829 m), SpO2 100%.    Vent Mode: PRVC FiO2 (%):  [60 %-100 %] 60 % Set Rate:  [16 bmp] 16 bmp Vt Set:  [620 mL] 620 mL PEEP:  [5 cmH20] 5 cmH20 Plateau Pressure:  [14 cmH20] 14 cmH20  No intake or output data in the 24 hours ending 05/05/23 0110 There were no vitals filed for this visit.  Examination: General: chronically and critically ill appearing elderly M HENT:  ETT secure. Anicteric sclera  Lungs: Bilateral rhonchi. Mechanically ventilated  Cardiovascular: rrr cap refill < 3 sec  Abdomen: thin soft. Healed old R nephrectomy scar  Extremities: abrasion on L knee Neuro: Sedated on fent gtt. Pinpoint pupils, does not follow commands, no response to painful stim   GU: foley yellow urine   Resolved Hospital Problem list     Assessment & Plan:   Large L corona radiata IPH, extending into subarachnoid space-- with "brain compression" (6.8 x 4.6 x 3.9 cm hematoma -- 60cc volume. 6mm Rightward midline shift)  R superior temporal gyrus hemorrhage (6mm) -thoughtful ddx outlined by neuro: cerebral amyloid angiopathy, consider metastatic dz -- hx renal mass  P -SBP goal 130-150  -NSGY consulted  -repeat CT H in 6hr -no chemical vte ppx for now  -no antiplt -MRI brain w/wo eval for mass -HTS, goal Na 150-155 -- Na q6hr  -elevated HOB -close neuro monitoring  -secondary stroke w/u per neuro   Endotracheally intubated Tobacco use disorder (1-3 ppd x 30some years)  Suspected aspiration  P -VAP, PAD, pulm hygiene -unasyn  -will add PRN duoneb    Sedation related hypotension  Hx HTN P -goal 130-150 as above -weaning sedation -may need periph pressors vs volume repletion (vomiting PTA)   Vomiting -likely in setting of ICH  -OGT LIMWS  RAS s/p stent,  on DAPT  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  -holding DAPT   S/p R nephrectomy AKI vs CKD  -chart merge in process P -follow renal indices, UOP  Hyperglycemia -SSI   Elevated LA -trend   Best Practice (right click and "Reselect all SmartList Selections" daily)   Diet/type: NPO DVT prophylaxis: SCD GI prophylaxis: PPI Lines: N/A Foley:  Yes, and it is still needed Code Status:  full code Last date of multidisciplinary goals of care discussion [9/11]  Labs   CBC: Recent Labs  Lab May 19, 2023 2229 19-May-2023 2236  WBC 14.1*  --   HGB 16.9  16.7  HCT 49.7 49.0  MCV 94.8  --   PLT 179  --     Basic Metabolic Panel: Recent Labs  Lab 2023/05/19 2229 05-19-2023 2236  NA 136 139  K 4.8 4.8  CL 102 104  CO2 23  --   GLUCOSE 171* 168*  BUN 17 19  CREATININE 1.63* 1.70*  CALCIUM 9.1  --    GFR: CrCl cannot be calculated (Unknown ideal weight.). Recent Labs  Lab 2023-05-19 2229 19-May-2023 2237  WBC 14.1*  --   LATICACIDVEN  --  2.1*    Liver Function Tests: Recent Labs  Lab 05-19-23 2229  AST 26  ALT 37  ALKPHOS 82  BILITOT 0.8  PROT 7.4  ALBUMIN 4.1   No results for input(s): "LIPASE", "AMYLASE" in the last 168 hours. No results for input(s): "AMMONIA" in the last 168 hours.  ABG    Component Value Date/Time   TCO2 23 05/19/2023 2236     Coagulation Profile: Recent Labs  Lab 2023/05/19 2229  INR 1.1    Cardiac Enzymes: No results for input(s): "CKTOTAL", "CKMB", "CKMBINDEX", "TROPONINI" in the last 168 hours.  HbA1C: No results found for: "HGBA1C"  CBG: No results for input(s): "GLUCAP" in the last 168 hours.  Review of Systems:   Unable, intubated sedated   Past Medical History:  He,  has no past medical history on file.   Surgical History:  R nephrectomy  Social History:    Tobacco use   Family History:  His family history is not on file.   Allergies No Known Allergies   Home Medications  Prior to Admission medications   Not on File     Critical care time: 40 min      CRITICAL CARE Performed by: Lanier Clam   Total critical care time: 40 minutes  Critical care time was exclusive of separately billable procedures and treating other patients. Critical care was necessary to treat or prevent imminent or life-threatening deterioration.  Critical care was time spent personally by me on the following activities: development of treatment plan with patient and/or surrogate as well as nursing, discussions with consultants, evaluation of patient's response to treatment,  examination of patient, obtaining history from patient or surrogate, ordering and performing treatments and interventions, ordering and review of laboratory studies, ordering and review of radiographic studies, pulse oximetry and re-evaluation of patient's condition.  Tessie Fass MSN, AGACNP-BC [facility] Amion for pager  05/05/2023, 1:10 AM

## 2023-05-05 NOTE — Progress Notes (Signed)
EEG complete - results pending 

## 2023-05-05 NOTE — ED Notes (Signed)
ED TO INPATIENT HANDOFF REPORT  ED Nurse Name and Phone #: [phone] Saree Krogh   S Name/Age/Gender Peggyann Shoals 72 y.o. male Room/Bed: TRAAC/TRAAC  Code Status   Code Status: Full Code  Home/SNF/Other Home Patient oriented to: self Is this baseline? No      Chief Complaint ICH (intracerebral hemorrhage) (HCC) [I61.9]  Triage Note 66 male fall on back deck at 4 pm.  Loc and vomit after fall abrasion on back of head. less responsive with family called ems. Can't move right side. Not following commands. Patient on Plavix. 85%ra Ronchi  6l 99% 82 hr  Bp 145/86   Allergies No Known Allergies  Level of Care/Admitting Diagnosis ED Disposition     ED Disposition  Admit   Condition  --   Comment  Hospital Area: [facility] [[id number]]  Level of Care: ICU [6]  May admit patient to [facility] or [facility] if equivalent level of care is available:: No  Covid Evaluation: Asymptomatic - no recent exposure (last 10 days) testing not required  Diagnosis: ICH (intracerebral hemorrhage) [facility]) [[id number]]  Admitting Physician: Erick Blinks [[id number]]  Attending Physician: Erick Blinks [[id number]]  Certification:: I certify this patient will need inpatient services for at least 2 midnights  Expected Medical Readiness: 05/21/2023          B Medical/Surgery History No past medical history on file.    A IV Location/Drains/Wounds Patient Lines/Drains/Airways Status     Active Line/Drains/Airways     Name Placement date Placement time Site Days   Peripheral IV 05/04/23 18 G Right Antecubital 05/04/23  2231  Antecubital  1   Peripheral IV 05/04/23 18 G Left Antecubital 05/04/23  2231  Antecubital  1   NG/OG Vented/Dual Lumen Oral 55 cm 05/04/23  2311  Oral  1   Urethral Catheter Page Pucciarelli E Temperature probe 16 Fr. 05/05/23  0007  Temperature probe  less than 1   Airway 7.5 mm 05/04/23  2313  -- 1            Intake/Output Last 24 hours No intake or  output data in the 24 hours ending 05/05/23 0017  Labs/Imaging Results for orders placed or performed during the hospital encounter of 05/04/23 (from the past 48 hour(s))  Comprehensive metabolic panel     Status: Abnormal   Collection Time: 05/04/23 10:29 PM  Result Value Ref Range   Sodium 136 135 - 145 mmol/L   Potassium 4.8 3.5 - 5.1 mmol/L   Chloride 102 98 - 111 mmol/L   CO2 23 22 - 32 mmol/L   Glucose, Bld 171 (H) 70 - 99 mg/dL    Comment: Glucose reference range applies only to samples taken after fasting for at least 8 hours.   BUN 17 8 - 23 mg/dL   Creatinine, Ser 0.10 (H) 0.61 - 1.24 mg/dL   Calcium 9.1 8.9 - 93.2 mg/dL   Total Protein 7.4 6.5 - 8.1 g/dL   Albumin 4.1 3.5 - 5.0 g/dL   AST 26 15 - 41 U/L   ALT 37 0 - 44 U/L   Alkaline Phosphatase 82 38 - 126 U/L   Total Bilirubin 0.8 0.3 - 1.2 mg/dL   GFR, Estimated 28 (L) >60 mL/min    Comment: (NOTE) Calculated using the CKD-EPI Creatinine Equation (2021)    Anion gap 11 5 - 15    Comment: Performed at [facility], [address] [place], [place] [address]  CBC  Status: Abnormal   Collection Time: 05/04/23 10:29 PM  Result Value Ref Range   WBC 14.1 (H) 4.0 - 10.5 K/uL   RBC 5.24 4.22 - 5.81 MIL/uL   Hemoglobin 16.9 13.0 - 17.0 g/dL   HCT 40.9 81.1 - 91.4 %   MCV 94.8 80.0 - 100.0 fL   MCH 32.3 26.0 - 34.0 pg   MCHC 34.0 30.0 - 36.0 g/dL   RDW 78.2 95.6 - 21.3 %   Platelets 179 150 - 400 K/uL   nRBC 0.0 0.0 - 0.2 %    Comment: Performed at [facility], [address] [place], [place] [address]  Ethanol     Status: None   Collection Time: 05/04/23 10:29 PM  Result Value Ref Range   Alcohol, Ethyl (B) <10 <10 mg/dL    Comment: (NOTE) Lowest detectable limit for serum alcohol is 10 mg/dL.  For medical purposes only. Performed at [facility], [address] [place], [place] [address]   Protime-INR     Status: None   Collection Time: 05/04/23 10:29 PM  Result Value Ref Range    Prothrombin Time 14.2 11.4 - 15.2 seconds   INR 1.1 0.8 - 1.2    Comment: (NOTE) INR goal varies based on device and disease states. Performed at [facility], [address], [place], [place] [address]   Sample to Blood Bank     Status: None   Collection Time: 05/04/23 10:29 PM  Result Value Ref Range   Blood Bank Specimen SAMPLE AVAILABLE FOR TESTING    Sample Expiration      05/07/2023,2359 Performed at [facility], [address] [place], [place] [address]   I-Stat Chem 8, ED     Status: Abnormal   Collection Time: 05/04/23 10:36 PM  Result Value Ref Range   Sodium 139 135 - 145 mmol/L   Potassium 4.8 3.5 - 5.1 mmol/L   Chloride 104 98 - 111 mmol/L   BUN 19 8 - 23 mg/dL   Creatinine, Ser 4.01 (H) 0.61 - 1.24 mg/dL   Glucose, Bld 027 (H) 70 - 99 mg/dL    Comment: Glucose reference range applies only to samples taken after fasting for at least 8 hours.   Calcium, Ion 1.13 (L) 1.15 - 1.40 mmol/L   TCO2 23 22 - 32 mmol/L   Hemoglobin 16.7 13.0 - 17.0 g/dL   HCT 25.3 66.4 - 40.3 %  I-Stat Lactic Acid, ED     Status: Abnormal   Collection Time: 05/04/23 10:37 PM  Result Value Ref Range   Lactic Acid, Venous 2.1 (HH) 0.5 - 1.9 mmol/L   Comment NOTIFIED PHYSICIAN    DG Abdomen 1 View  Result Date: 05/05/2023 CLINICAL DATA:  OG tube placement. EXAM: ABDOMEN - 1 VIEW COMPARISON:  None Available. FINDINGS: An orogastric tube is seen with its distal tip overlying the expected region of the body of the stomach. Its distal side hole sits approximately 2.0 cm distal to the expected region of the gastroesophageal junction. The bowel gas pattern is normal. Surgical clips are seen overlying the right upper quadrant. A mild amount of radiopaque contrast is seen within the left renal collecting system. IMPRESSION: Orogastric tube positioning, as described above. Electronically Signed   By: Aram Candela M.D.   On: 05/05/2023 00:14   DG Chest Port 1 View  Result Date:  05/05/2023 CLINICAL DATA:  Level 1 trauma, status post intubation. EXAM: PORTABLE CHEST 1  VIEW COMPARISON:  May 04, 2023 (10:29 p.m.) FINDINGS: Since the prior study there is been interval endotracheal tube placement, with its distal tip approximately 2.9 cm from the carina. Interval enteric tube placement is also seen with its distal end extending into the body of the stomach. The heart size and mediastinal contours are within normal limits. Low lung volumes are seen with mild atelectatic changes present within the bilateral lung bases. No pleural effusion or pneumothorax is identified. The visualized skeletal structures are unremarkable. IMPRESSION: 1. Interval endotracheal and enteric tube placement, as described above. 2. Low lung volumes with mild bibasilar atelectasis. Electronically Signed   By: Aram Candela M.D.   On: 05/05/2023 00:13   CT CHEST ABDOMEN PELVIS W CONTRAST  Result Date: 05/04/2023 CLINICAL DATA:  Level 1 trauma.  Fall. EXAM: CT CHEST, ABDOMEN, AND PELVIS WITH CONTRAST TECHNIQUE: Multidetector CT imaging of the chest, abdomen and pelvis was performed following the standard protocol during bolus administration of intravenous contrast. RADIATION DOSE REDUCTION: This exam was performed according to the departmental dose-optimization program which includes automated exposure control, adjustment of the mA and/or kV according to patient size and/or use of iterative reconstruction technique. CONTRAST:  75mL OMNIPAQUE IOHEXOL 350 MG/ML SOLN COMPARISON:  None Available. FINDINGS: CT CHEST FINDINGS Cardiovascular: There is no cardiomegaly or pericardial effusion. Advanced 3 vessel coronary vascular calcification. There is severe calcified and noncalcified plaque of the thoracic aorta. No aneurysmal dilatation or dissection. The origins of the great vessels of the aortic arch and the central pulmonary arteries appear patent. Mediastinum/Nodes: No hilar or mediastinal adenopathy. The  esophagus and thyroid gland are grossly unremarkable. No mediastinal fluid collection. Lungs/Pleura: Background of emphysema. No focal consolidation, pleural effusion, or pneumothorax. Small amount of mucous debris in the trachea. The central airways remain patent. Musculoskeletal: No acute osseous pathology. CT ABDOMEN PELVIS FINDINGS No intra-abdominal free air or free fluid. Hepatobiliary: The liver is unremarkable. There is biliary dilatation, post cholecystectomy. No retained calcified stone noted in the central CBD. Pancreas: Unremarkable. No pancreatic ductal dilatation or surrounding inflammatory changes. Spleen: Normal in size without focal abnormality. Adrenals/Urinary Tract: The left adrenal glands unremarkable. There is a 3.5 cm right adrenal myelolipoma. There is a solitary left kidney. No hydronephrosis. Subcentimeter left renal hypodense lesions are too small to characterize. The left ureter and urinary bladder appear unremarkable. Stomach/Bowel: There is sigmoid diverticulosis without active inflammatory changes. There is moderate stool throughout the colon. There is no bowel obstruction or active inflammation. The appendix is normal. Vascular/Lymphatic: Advanced aortoiliac atherosclerotic disease. There is a 3 cm partially thrombosed infrarenal abdominal aortic aneurysm. 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 Left common iliac artery stent is noted which is suboptimally evaluated on the CT. There is flow within the left external iliac artery. A femorofemoral bypass graft appears patent. The IVC is unremarkable. No portal venous gas. There is no adenopathy. Reproductive: The prostate and seminal vesicles are grossly unremarkable. Other: None Musculoskeletal: Osteopenia with degenerative changes of the spine. No acute osseous pathology. IMPRESSION: 1. No acute/traumatic intrathoracic, abdominal, or pelvic  pathology. 2. 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 Patent femorofemoral bypass graft. 3. Sigmoid diverticulosis. No bowel obstruction. Normal appendix. 4. A 3.5 cm right adrenal myelolipoma. Recommend follow-up ultrasound every 3 years. This recommendation follows ACR consensus guidelines: White Paper of the ACR Incidental Findings  Committee II on Vascular Findings. J Am Coll Radiol 2013; 10:789-794. 5. Aortic Atherosclerosis (ICD10-I70.0) and Emphysema (ICD10-J43.9). These results were called by telephone at the time of interpretation on 05/04/2023 at 11:14 pm to Dr. Derrell Lolling, who verbally acknowledged these results. Electronically Signed   By: Elgie Collard M.D.   On: 05/04/2023 23:58   CT CERVICAL SPINE WO CONTRAST  Result Date: 05/04/2023 CLINICAL DATA:  Trauma.  Altered mental status. EXAM: CT CERVICAL SPINE WITHOUT CONTRAST TECHNIQUE: Multidetector CT imaging of the cervical spine was performed without intravenous contrast. Multiplanar CT image reconstructions were also generated. RADIATION DOSE REDUCTION: This exam was performed according to the departmental dose-optimization program which includes automated exposure control, adjustment of the mA and/or kV according to patient size and/or use of iterative reconstruction technique. COMPARISON:  None Available. FINDINGS: Alignment: No acute subluxation. Skull base and vertebrae: No acute fracture.  Osteopenia. Soft tissues and spinal canal: No prevertebral fluid or swelling. No visible canal hematoma. Disc levels:  No acute findings.  Degenerative changes. Upper chest: Emphysema. Other: Bilateral carotid bulb calcified plaques. IMPRESSION: 1. No acute/traumatic cervical spine pathology. 2.  Emphysema (ICD10-J43.9). These results were called by telephone at the time of interpretation on 05/04/2023 at 11:14 pm to Dr. Derrell Lolling, who verbally acknowledged these results.  Electronically Signed   By: Elgie Collard M.D.   On: 05/04/2023 23:34   CT ANGIO HEAD NECK W WO CM  Result Date: 05/04/2023 CLINICAL DATA:  Altered mental status EXAM: CT ANGIOGRAPHY HEAD AND NECK WITH AND WITHOUT CONTRAST TECHNIQUE: Multidetector CT imaging of the head and neck was performed using the standard protocol during bolus administration of intravenous contrast. Multiplanar CT image reconstructions and MIPs were obtained to evaluate the vascular anatomy. Carotid stenosis measurements (when applicable) are obtained utilizing NASCET criteria, using the distal internal carotid diameter as the denominator. RADIATION DOSE REDUCTION: This exam was performed according to the departmental dose-optimization program which includes automated exposure control, adjustment of the mA and/or kV according to patient size and/or use of iterative reconstruction technique. CONTRAST:  75mL OMNIPAQUE IOHEXOL 350 MG/ML SOLN COMPARISON:  None Available. FINDINGS: CT HEAD FINDINGS Brain: There is a massive intraparenchymal hematoma centered within the left corona radiata with extension into the overlying subarachnoid space. There is an apparent hematocrit level within the anterior aspect of the hematoma. The hematoma measures 6.8 x 4.6 x 3.9 cm (60 mL). There is rightward midline shift of approximately 6 mm. Subarachnoid blood extends into the left sylvian fissure. There is a small focus of hemorrhage along the right superior temporal gyrus (series 3, image 19) that measures 6 mm. There is confluent white matter hypoattenuation suggestive of chronic small vessel disease. Vascular: Nonspecific mildly hyperdense left MCA. Otherwise unremarkable. Skull: No skull fracture. Sinuses/Orbits: Fluid in left maxillary sinus. Other: Critical Value/emergent results were called by telephone at the time of interpretation on 05/04/2023 at 11:04 pm to provider [facility] , who verbally acknowledged these results. Review of the MIP  images confirms the above findings CTA NECK FINDINGS Aortic arch: Mixed density atherosclerosis of the aortic arch with bulky predominantly soft plaque of the proximal descending aorta. There is a normal 3 vessel branch pattern. Atherosclerotic disease of both subclavian arteries, worse on the left. There is an atherosclerotic web crossing the proximal left subclavian artery lumen, but the artery remains widely patent. Right carotid system: There is mild calcific atherosclerosis of the right common carotid artery. Moderate atherosclerosis at the bifurcation, but no hemodynamically significant stenosis by  NASCET criteria. The more distal ICA is normal. Left carotid system: Mild atherosclerosis of the common carotid artery with calcification at the bifurcation. No hemodynamically significant stenosis. The ICA is normal. Vertebral arteries: Mildly left dominant system. Both vertebral arteries are normal to the skull base. Skeleton: Mild cervical spondylosis.  No acute finding. Other neck: Negative Upper chest: Biapical paraseptal emphysema. Review of the MIP images confirms the above findings CTA HEAD FINDINGS Anterior circulation: --Intracranial internal carotid arteries: Atherosclerotic calcification of the internal carotid arteries at the skull base without hemodynamically significant stenosis. --Anterior cerebral arteries (ACA): Normal. --Middle cerebral arteries (MCA): Normal. Posterior circulation: --Vertebral arteries: Normal --Inferior cerebellar arteries: Normal. --Basilar artery: Normal. --Superior cerebellar arteries: Normal. --Posterior cerebral arteries: Normal. Venous sinuses: Limited visualization due to contrast phase Anatomic variants: None Review of the MIP images confirms the above findings IMPRESSION: 1. A 60 mL intraparenchymal hematoma centered within the left corona radiata with extension into the overlying subarachnoid space. Lobar location in combination with patient age raises suspicion for  amyloid angiopathy. If rightward midline shift of approximately 6 mm. 2. Small focus of hemorrhage along the right superior temporal gyrus, measuring 6 mm. Multifocal hemorrhage means metastatic disease must also be considered. 3. No emergent large vessel occlusion or hemodynamically significant stenosis of the head or neck. No aneurysm or vascular malformation. 4. 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 Aortic Atherosclerosis (ICD10-I70.0) and Emphysema (ICD10-J43.9). Critical Value/emergent results were called by telephone at the time of interpretation on 05/04/2023 at 11:04 pm to provider [facility] , who verbally acknowledged these results. Electronically Signed   By: Deatra Robinson M.D.   On: 05/04/2023 23:24   DG Chest Port 1 View  Result Date: 05/04/2023 CLINICAL DATA:  Status post fall off porch. EXAM: PORTABLE CHEST 1 VIEW COMPARISON:  None Available. FINDINGS: The heart size and mediastinal contours are within normal limits. There is marked severity calcification of the aortic arch. Both lungs are clear. The visualized skeletal structures are unremarkable. IMPRESSION: No active disease. Electronically Signed   By: Aram Candela M.D.   On: 05/04/2023 23:23   DG Pelvis Portable  Result Date: 05/04/2023 CLINICAL DATA:  Status post fall. EXAM: PORTABLE PELVIS 1-2 VIEWS COMPARISON:  None Available. FINDINGS: There is no evidence of pelvic fracture or diastasis. No pelvic bone lesions are seen. There is marked severity vascular calcification with a radiopaque stent seen within the expected region of the left common iliac artery. IMPRESSION: No acute osseous abnormality. Electronically Signed   By: Aram Candela M.D.   On: 05/04/2023 23:22    Pending Labs Unresulted Labs (From admission, onward)     Start     Ordered   05/04/23 2231  Urinalysis, Routine w reflex microscopic -Urine, Clean Catch  [facility] ED TRAUMA PANEL  [facility])  Once,   URGENT       Question:  Specimen Source  Answer:  Urine, Clean Catch   05/04/23 2232            Vitals/Pain Today's Vitals   05/04/23 2310 05/04/23 2311 05/04/23 2313 05/04/23 2325  BP: (!) 228/116 (!) 220/115  (!) 82/65  Pulse: 94 89 96 94  Resp: 16 16 (!) 26 (!) 21  Temp:      SpO2: 100% 100% 100% 100%  Height:   6' (1.829 m)     Isolation Precautions No active isolations  Medications Medications  clevidipine (CLEVIPREX) infusion 0.5 mg/mL (0 mg/hr Intravenous  Stopped 05/04/23 2323)  etomidate (AMIDATE) injection (20 mg Intravenous Given 05/04/23 2303)  succinylcholine (ANECTINE) injection (140 mg Intravenous Given 05/04/23 2303)  propofol (DIPRIVAN) 1000 MG/100ML infusion (10 mcg/kg/min  90 kg (Order-Specific) Intravenous Restarted 05/04/23 2336)  fentaNYL in NS (42mcg/ml) infusion-PREMIX (150 mcg/hr Intravenous New Bag/Given 05/04/23 2322)  fentaNYL (SUBLIMAZE) bolus via infusion 25-100 mcg (50 mcg Intravenous Bolus from Bag 05/04/23 2355)   stroke: early stages of recovery book (has no administration in time range)  acetaminophen (TYLENOL) tablet 650 mg (has no administration in time range)    Or  acetaminophen (TYLENOL) 160 MG/5ML solution 650 mg (has no administration in time range)    Or  acetaminophen (TYLENOL) suppository 650 mg (has no administration in time range)  senna-docusate (Senokot-S) tablet 1 tablet (has no administration in time range)  pantoprazole (PROTONIX) injection 40 mg (has no administration in time range)  labetalol (NORMODYNE) injection 10 mg (10 mg Intravenous Given 05/04/23 2251)  iohexol (OMNIPAQUE) 350 MG/ML injection 75 mL (75 mLs Intravenous Contrast Given 05/04/23 2302)    Mobility non-ambulatory     Focused Assessments Neuro Assessment Handoff:  Swallow screen pass?  Not complete pt intubated   NIH Stroke Scale  Dizziness Present: No Headache Present: No Level of Consciousness (1a.)   : Responds only  with reflex motor or autonomic effects or totally unresponsive, flaccid, and areflexic LOC Questions (1b. )   : Answers neither question correctly LOC Commands (1c. )   : Performs neither task correctly Best Gaze (2. )  : Normal (UTA) Visual (3. )  : No visual loss (UTA) Facial Palsy (4. )    : Normal symmetrical movements Motor Arm, Left (5a. )   : Amputation or joint fusion Motor Arm, Right (5b. ) : Amputation or joint fusion Motor Leg, Left (6a. )  : Amputation or joint fusion Motor Leg, Right (6b. ) : Amputation or joint fusion Limb Ataxia (7. ): Amputation or joint fusion Sensory (8. )  : Normal, no sensory loss Best Language (9. )  : Mute, global aphasia Dysarthria (10. ): Intubated or other physical barrier Extinction/Inattention (11.)   : No Abnormality Complete NIHSS TOTAL: 10     Neuro Assessment:   Neuro Checks:      Has TPA been given? No If patient is a Neuro Trauma and patient is going to OR before floor call report to 4N Charge nurse: [phone] or [phone]   R Recommendations: See Admitting Provider Note  Report given to:   Additional Notes: n/a

## 2023-05-05 NOTE — Progress Notes (Deleted)
eLink Physician-Brief Progress Note Patient Name: Jeffrey Stout DOB: 14-Mar-1951 MRN: [id number]   Date of Service  05/05/2023  HPI/Events of Note  Hemoglobin 6.9 gm / dl, K+ 3.3  eICU Interventions  One unit PRBC transfused, K+ replaced.        Migdalia Dk 05/05/2023, 6:37 AM

## 2023-05-05 NOTE — Progress Notes (Signed)
Pharmacy Antibiotic Note  Jeffrey Stout is a 72 y.o. male admitted on 05/04/2023 with  asp pna .  Pharmacy has been consulted for Unasyn dosing.  Plan: Unasyn 3gm IV q6h Will f/u micro data, renal function, and pt's clinical condition  Height: 6' 0.01" (182.9 cm) IBW/kg (Calculated) : 77.62  Temp (24hrs), Avg:99 F (37.2 C), Min:96 F (35.6 C), Max:100 F (37.8 C)  Recent Labs  Lab 05/04/23 2229 05/04/23 2236 05/04/23 2237  WBC 14.1*  --   --   CREATININE 1.63* 1.70*  --   LATICACIDVEN  --   --  2.1*    CrCl cannot be calculated (Unknown ideal weight.).    No Known Allergies  Antimicrobials this admission: 9/11 Unasyn >>   Microbiology results: Pending  Thank you for allowing pharmacy to be a part of this patient's care.  Christoper Fabian, PharmD, BCPS Please see amion for complete clinical pharmacist phone list 05/05/2023 1:03 AM

## 2023-05-05 NOTE — Progress Notes (Signed)
An USGPIV (ultrasound guided PIV) has been placed for short-term vasopressor infusion. A correctly placed ivWatch must be used when administering Vasopressors. Should this treatment be needed beyond 24 hours, central line access should be obtained.  It will be the responsibility of the bedside nurse to follow best practice to prevent extravasations.

## 2023-05-05 NOTE — Progress Notes (Signed)
RT assisted with transport of this pt from 3M08 to MRI and back while on full ventilatory support. Pt tolerated well with SVS and no complications.

## 2023-05-05 NOTE — Progress Notes (Signed)
SLP Cancellation Note  Patient Details Name: [name] MRN: [id number] DOB: 07-06-1951   Cancelled treatment:        Received order for speech-language-cognition however pt is currently on the vent. Will continue to follow.    Royce Macadamia 05/05/2023, 12:54 PM

## 2023-05-05 NOTE — Progress Notes (Signed)
Echocardiogram 2D Echocardiogram has been performed.  Jeffrey Stout 05/05/2023, 9:59 AM

## 2023-05-05 NOTE — Progress Notes (Addendum)
STROKE TEAM PROGRESS NOTE   BRIEF HPI [name] is a 72 y.o. male with history of HTN, HLD, renal cell carcinoma s/p R nephrectomy, renal artery stenting on DAPT (aspirin/plavix), presenting with R sided weakness, AMS, left gaze preference after a fall -- found to have large left corona radiata IPH extending into subarachnoid space (hematoma 60cc volume). Additionally, found small hemorrhage along R superior temporal gyrus, raising suspicion for metastatic RCC. Midline shift of 6mm. GCS 12. BP 228/116. Hypoxic to mid-80s. Patient lost consciousness, requiring intubation. EEG negative.    SIGNIFICANT HOSPITAL EVENTS 9/10: Admission, CTA Head/Neck: no LVO. CT Head: multiple hemorrhages, rightward mass effect. intubation  INTERIM HISTORY/SUBJECTIVE  Patient is intubated and sedated on exam but opens eyes. Follows commands. Vision crosses midline while tracking to the right. Noted R leg weakness. SCDs placed.    OBJECTIVE  CBC    Component Value Date/Time   WBC 9.1 05/05/2023 0857   RBC 4.46 05/05/2023 0857   HGB 15.0 05/05/2023 0857   HCT 43.2 05/05/2023 0857   PLT 103 (L) 05/05/2023 0857   MCV 96.9 05/05/2023 0857   MCH 33.6 05/05/2023 0857   MCHC 34.7 05/05/2023 0857   RDW 12.6 05/05/2023 0857    BMET    Component Value Date/Time   NA 144 05/05/2023 0857   K 4.0 05/05/2023 0857   CL 114 (H) 05/05/2023 0857   CO2 21 (L) 05/05/2023 0857   GLUCOSE 134 (H) 05/05/2023 0857   BUN 17 05/05/2023 0857   CREATININE 1.56 (H) 05/05/2023 0857   CALCIUM 8.5 (L) 05/05/2023 0857   GFRNONAA 47 (L) 05/05/2023 0857    IMAGING past 24 hours ECHOCARDIOGRAM COMPLETE  Result Date: 05/05/2023    ECHOCARDIOGRAM REPORT   Patient Name:   Jeffrey Stout Date of Exam: 05/05/2023 Medical Rec #:  [id number]     Height:       72.0 in Accession #:    [id number]    Weight:       174.2 lb Date of Birth:  [birth year]     BSA:          2.009 m Patient Age:    72 years      BP:           119/67 mmHg  Patient Gender: M             HR:           68 bpm. Exam Location:  Inpatient Procedure: 2D Echo, Cardiac Doppler and Color Doppler Indications:    Stroke I63.9  History:        Patient has no prior history of Echocardiogram examinations.                 Signs/Symptoms:Hypotension; Risk Factors:Hypertension.  Sonographer:    Lucendia Herrlich Referring Phys: [id number] [facility]  Sonographer Comments: Echo performed with patient supine and on artificial respirator. IMPRESSIONS  1. Poor acoustic windows. limited study. Left ventricular ejection fraction, by estimation, is 60 to 65%. The left ventricle has normal function. The left ventricle has no regional wall motion abnormalities. There is mild left ventricular hypertrophy. Left ventricular diastolic function could not be evaluated.  2. Right ventricular systolic function is normal. The right ventricular size is normal. Tricuspid regurgitation signal is inadequate for assessing PA pressure.  3. The mitral valve was not well visualized. No evidence of mitral valve regurgitation.  4. The aortic valve was not well visualized. Aortic valve regurgitation is not visualized.  5. The inferior vena cava is normal in size with greater than 50% respiratory variability, suggesting right atrial pressure of 3 mmHg. FINDINGS  Left Ventricle: Poor acoustic windows. limited study. Left ventricular ejection fraction, by estimation, is 60 to 65%. The left ventricle has normal function. The left ventricle has no regional wall motion abnormalities. The left ventricular internal cavity size was normal in size. There is mild left ventricular hypertrophy. Left ventricular diastolic function could not be evaluated. Right Ventricle: The right ventricular size is normal. Right ventricular systolic function is normal. Tricuspid regurgitation signal is inadequate for assessing PA pressure. Left Atrium: Left atrial size was normal in size. Right Atrium: Right atrial size was normal in size.  Pericardium: Trivial pericardial effusion is present. Mitral Valve: The mitral valve was not well visualized. No evidence of mitral valve regurgitation. Tricuspid Valve: Tricuspid valve regurgitation is not demonstrated. Aortic Valve: The aortic valve was not well visualized. Aortic valve regurgitation is not visualized. Pulmonic Valve: The pulmonic valve was not well visualized. Aorta: The aortic root and ascending aorta are structurally normal, with no evidence of dilitation. Venous: The inferior vena cava is normal in size with greater than 50% respiratory variability, suggesting right atrial pressure of 3 mmHg. IAS/Shunts: The interatrial septum was not well visualized.  LEFT VENTRICLE PLAX 2D LVIDd:         4.60 cm LVIDs:         3.40 cm LV PW:         1.00 cm LV IVS:        1.10 cm LVOT diam:     2.10 cm LVOT Area:     3.46 cm  IVC IVC diam: 1.80 cm LEFT ATRIUM         Index LA diam:    2.80 cm 1.39 cm/m   AORTA Ao Root diam: 3.40 cm Ao Asc diam:  3.30 cm  SHUNTS Systemic Diam: 2.10 cm Carolan Clines Electronically signed by Carolan Clines Signature Date/Time: 05/05/2023/11:09:41 AM    Final    CT HEAD WO CONTRAST  Result Date: 05/05/2023 CLINICAL DATA:  72 year old male status post fall, altered mental status with intracranial hemorrhage. EXAM: CT HEAD WITHOUT CONTRAST TECHNIQUE: Contiguous axial images were obtained from the base of the skull through the vertex without intravenous contrast. RADIATION DOSE REDUCTION: This exam was performed according to the departmental dose-optimization program which includes automated exposure control, adjustment of the mA and/or kV according to patient size and/or use of iterative reconstruction technique. COMPARISON:  CT head, CTA head and neck yesterday. FINDINGS: Brain: Large and hyperdense left hemisphere intra-axial hemorrhage with some extra-axial extension into the subarachnoid spaces. No convincing intraventricular extension. Layering hematocrit level on series 4,  image 24. The intra-axial blood volume is estimated at 113 mL (75 x 52 by 58 mm AP by transverse by CC), and does appear slightly larger since yesterday (versus 77 by 49 x 55 mm when measured in the same way yesterday, 104 mL). Bilateral subarachnoid hemorrhage, left greater than right. Right temporal lobe hemorrhage is favored to be in the subarachnoid space. No convincing 2nd site of intra-axial blood. Left hemisphere mass effect with rightward midline shift of 5-6 mm (versus 3-4 mm yesterday). Mass effect on the ventricles is stable. No ventriculomegaly. Basilar cisterns remain patent although the suprasellar cistern is slightly effaced. Edema in the left hemisphere with evidence of underlying bilateral white matter disease. No right hemisphere or posterior fossa cortically based infarct identified. Vascular: No suspicious intracranial vascular  hyperdensity. Skull: Intact calvarium. Difficult to exclude nondisplaced left maxillary fracture. Sinuses/Orbits: Layering fluid or hemorrhage in the left maxillary sinus redemonstrated. Other paranasal sinuses and mastoids are stable and well aerated. Other: Intubated. Oral enteric tube partially visible. Stable orbit and scalp soft tissues. IMPRESSION: 1. Large left hemisphere Intra-axial Hemorrhage estimated at 113 mL, appears mildly increased since yesterday (104 mL when estimated with the same technique). 2. Bilateral subarachnoid extension of blood, greater on the left. No convincing 2nd site of intra-axial hemorrhage. 3. Intracranial mass effect with rightward midline shift of 5-6 mm, about 1 mm increased from yesterday. 4. No ventriculomegaly.  No new intracranial abnormality. 5. Difficult to exclude posttraumatic hemorrhage in the left maxillary sinus. Electronically Signed   By: Odessa Fleming M.D.   On: 05/05/2023 05:53   EEG adult  Result Date: 05/05/2023 Charlsie Quest, MD     05/05/2023  5:35 AM Patient Name: Basilios Brabec MRN: [id number] Epilepsy Attending:  Charlsie Quest Referring Physician/Provider: Erick Blinks, MD Date: 05/05/2023 Duration: 25.10 mins Patient history:  72 yo male who is presumably on Plavix with multifocal hemorrhage including an acute L sided intraparenchymal hemorrhage with additional hemorrhage along R superior temporal gyrus. EEG to evaluate for seizure.  Level of alertness:  lethargic / comatose AEDs during EEG study: None Technical aspects: This EEG study was done with scalp electrodes positioned according to the 10-20 International system of electrode placement. Electrical activity was reviewed with band pass filter of 1-70Hz , sensitivity of 7 uV/mm, display speed of 8mm/sec with a 60Hz  notched filter applied as appropriate. EEG data were recorded continuously and digitally stored.  Video monitoring was available and reviewed as appropriate. Description: EEG showed continuous generalized and lateralized left hemisphere 3 to 5 Hz theta-delta slowing.  Hyperventilation and photic stimulation were not performed.   ABNORMALITY - Continuous slow, generalized and lateralized left hemisphere IMPRESSION: This study is  suggestive of cortical dysfunction arising from left hemisphere likely secondary to underlying hemorrhage. Additionally there is moderate to severe diffuse encephalopathy. No seizures or epileptiform discharges were seen throughout the recording. Charlsie Quest   DG Abdomen 1 View  Result Date: 05/05/2023 CLINICAL DATA:  OG tube placement. EXAM: ABDOMEN - 1 VIEW COMPARISON:  None Available. FINDINGS: An orogastric tube is seen with its distal tip overlying the expected region of the body of the stomach. Its distal side hole sits approximately 2.0 cm distal to the expected region of the gastroesophageal junction. The bowel gas pattern is normal. Surgical clips are seen overlying the right upper quadrant. A mild amount of radiopaque contrast is seen within the left renal collecting system. IMPRESSION: Orogastric tube  positioning, as described above. Electronically Signed   By: Aram Candela M.D.   On: 05/05/2023 00:14   DG Chest Port 1 View  Result Date: 05/05/2023 CLINICAL DATA:  Level 1 trauma, status post intubation. EXAM: PORTABLE CHEST 1 VIEW COMPARISON:  May 04, 2023 (10:29 p.m.) FINDINGS: Since the prior study there is been interval endotracheal tube placement, with its distal tip approximately 2.9 cm from the carina. Interval enteric tube placement is also seen with its distal end extending into the body of the stomach. The heart size and mediastinal contours are within normal limits. Low lung volumes are seen with mild atelectatic changes present within the bilateral lung bases. No pleural effusion or pneumothorax is identified. The visualized skeletal structures are unremarkable. IMPRESSION: 1. Interval endotracheal and enteric tube placement, as described above. 2. Low lung volumes with  mild bibasilar atelectasis. Electronically Signed   By: Aram Candela M.D.   On: 05/05/2023 00:13   CT CHEST ABDOMEN PELVIS W CONTRAST  Result Date: 05/04/2023 CLINICAL DATA:  Level 1 trauma.  Fall. EXAM: CT CHEST, ABDOMEN, AND PELVIS WITH CONTRAST TECHNIQUE: Multidetector CT imaging of the chest, abdomen and pelvis was performed following the standard protocol during bolus administration of intravenous contrast. RADIATION DOSE REDUCTION: This exam was performed according to the departmental dose-optimization program which includes automated exposure control, adjustment of the mA and/or kV according to patient size and/or use of iterative reconstruction technique. CONTRAST:  75mL OMNIPAQUE IOHEXOL 350 MG/ML SOLN COMPARISON:  None Available. FINDINGS: CT CHEST FINDINGS Cardiovascular: There is no cardiomegaly or pericardial effusion. Advanced 3 vessel coronary vascular calcification. There is severe calcified and noncalcified plaque of the thoracic aorta. No aneurysmal dilatation or dissection. The origins of the  great vessels of the aortic arch and the central pulmonary arteries appear patent. Mediastinum/Nodes: No hilar or mediastinal adenopathy. The esophagus and thyroid gland are grossly unremarkable. No mediastinal fluid collection. Lungs/Pleura: Background of emphysema. No focal consolidation, pleural effusion, or pneumothorax. Small amount of mucous debris in the trachea. The central airways remain patent. Musculoskeletal: No acute osseous pathology. CT ABDOMEN PELVIS FINDINGS No intra-abdominal free air or free fluid. Hepatobiliary: The liver is unremarkable. There is biliary dilatation, post cholecystectomy. No retained calcified stone noted in the central CBD. Pancreas: Unremarkable. No pancreatic ductal dilatation or surrounding inflammatory changes. Spleen: Normal in size without focal abnormality. Adrenals/Urinary Tract: The left adrenal glands unremarkable. There is a 3.5 cm right adrenal myelolipoma. There is a solitary left kidney. No hydronephrosis. Subcentimeter left renal hypodense lesions are too small to characterize. The left ureter and urinary bladder appear unremarkable. Stomach/Bowel: There is sigmoid diverticulosis without active inflammatory changes. There is moderate stool throughout the colon. There is no bowel obstruction or active inflammation. The appendix is normal. Vascular/Lymphatic: Advanced aortoiliac atherosclerotic disease. There is a 3 cm partially thrombosed infrarenal abdominal aortic aneurysm. 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 Left common iliac artery stent is noted which is suboptimally evaluated on the CT. There is flow within the left external iliac artery. A femorofemoral bypass graft appears patent. The IVC is unremarkable. No portal venous gas. There is no adenopathy. Reproductive: The prostate and seminal vesicles are grossly unremarkable. Other: None Musculoskeletal: Osteopenia with  degenerative changes of the spine. No acute osseous pathology. IMPRESSION: 1. No acute/traumatic intrathoracic, abdominal, or pelvic pathology. 2. 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 Patent femorofemoral bypass graft. 3. Sigmoid diverticulosis. No bowel obstruction. Normal appendix. 4. A 3.5 cm right adrenal myelolipoma. Recommend follow-up ultrasound every 3 years. This recommendation follows ACR consensus guidelines: White Paper of the ACR Incidental Findings Committee II on Vascular Findings. J Am Coll Radiol 2013; 10:789-794. 5. Aortic Atherosclerosis (ICD10-I70.0) and Emphysema (ICD10-J43.9). These results were called by telephone at the time of interpretation on 05/04/2023 at 11:14 pm to Dr. Derrell Lolling, who verbally acknowledged these results. Electronically Signed   By: Elgie Collard M.D.   On: 05/04/2023 23:58   CT CERVICAL SPINE WO CONTRAST  Result Date: 05/04/2023 CLINICAL DATA:  Trauma.  Altered mental status. EXAM: CT CERVICAL SPINE WITHOUT CONTRAST TECHNIQUE: Multidetector CT imaging of the cervical spine was performed without intravenous contrast. Multiplanar CT image reconstructions were also generated. RADIATION  DOSE REDUCTION: This exam was performed according to the departmental dose-optimization program which includes automated exposure control, adjustment of the mA and/or kV according to patient size and/or use of iterative reconstruction technique. COMPARISON:  None Available. FINDINGS: Alignment: No acute subluxation. Skull base and vertebrae: No acute fracture.  Osteopenia. Soft tissues and spinal canal: No prevertebral fluid or swelling. No visible canal hematoma. Disc levels:  No acute findings.  Degenerative changes. Upper chest: Emphysema. Other: Bilateral carotid bulb calcified plaques. IMPRESSION: 1. No acute/traumatic cervical spine pathology. 2.  Emphysema (ICD10-J43.9). These results were called by  telephone at the time of interpretation on 05/04/2023 at 11:14 pm to Dr. Derrell Lolling, who verbally acknowledged these results. Electronically Signed   By: Elgie Collard M.D.   On: 05/04/2023 23:34   CT ANGIO HEAD NECK W WO CM  Result Date: 05/04/2023 CLINICAL DATA:  Altered mental status EXAM: CT ANGIOGRAPHY HEAD AND NECK WITH AND WITHOUT CONTRAST TECHNIQUE: Multidetector CT imaging of the head and neck was performed using the standard protocol during bolus administration of intravenous contrast. Multiplanar CT image reconstructions and MIPs were obtained to evaluate the vascular anatomy. Carotid stenosis measurements (when applicable) are obtained utilizing NASCET criteria, using the distal internal carotid diameter as the denominator. RADIATION DOSE REDUCTION: This exam was performed according to the departmental dose-optimization program which includes automated exposure control, adjustment of the mA and/or kV according to patient size and/or use of iterative reconstruction technique. CONTRAST:  75mL OMNIPAQUE IOHEXOL 350 MG/ML SOLN COMPARISON:  None Available. FINDINGS: CT HEAD FINDINGS Brain: There is a massive intraparenchymal hematoma centered within the left corona radiata with extension into the overlying subarachnoid space. There is an apparent hematocrit level within the anterior aspect of the hematoma. The hematoma measures 6.8 x 4.6 x 3.9 cm (60 mL). There is rightward midline shift of approximately 6 mm. Subarachnoid blood extends into the left sylvian fissure. There is a small focus of hemorrhage along the right superior temporal gyrus (series 3, image 19) that measures 6 mm. There is confluent white matter hypoattenuation suggestive of chronic small vessel disease. Vascular: Nonspecific mildly hyperdense left MCA. Otherwise unremarkable. Skull: No skull fracture. Sinuses/Orbits: Fluid in left maxillary sinus. Other: Critical Value/emergent results were called by telephone at the time of  interpretation on 05/04/2023 at 11:04 pm to provider [facility] , who verbally acknowledged these results. Review of the MIP images confirms the above findings CTA NECK FINDINGS Aortic arch: Mixed density atherosclerosis of the aortic arch with bulky predominantly soft plaque of the proximal descending aorta. There is a normal 3 vessel branch pattern. Atherosclerotic disease of both subclavian arteries, worse on the left. There is an atherosclerotic web crossing the proximal left subclavian artery lumen, but the artery remains widely patent. Right carotid system: There is mild calcific atherosclerosis of the right common carotid artery. Moderate atherosclerosis at the bifurcation, but no hemodynamically significant stenosis by NASCET criteria. The more distal ICA is normal. Left carotid system: Mild atherosclerosis of the common carotid artery with calcification at the bifurcation. No hemodynamically significant stenosis. The ICA is normal. Vertebral arteries: Mildly left dominant system. Both vertebral arteries are normal to the skull base. Skeleton: Mild cervical spondylosis.  No acute finding. Other neck: Negative Upper chest: Biapical paraseptal emphysema. Review of the MIP images confirms the above findings CTA HEAD FINDINGS Anterior circulation: --Intracranial internal carotid arteries: Atherosclerotic calcification of the internal carotid arteries at the skull base without hemodynamically significant stenosis. --Anterior cerebral arteries (ACA): Normal. --Middle cerebral  arteries (MCA): Normal. Posterior circulation: --Vertebral arteries: Normal --Inferior cerebellar arteries: Normal. --Basilar artery: Normal. --Superior cerebellar arteries: Normal. --Posterior cerebral arteries: Normal. Venous sinuses: Limited visualization due to contrast phase Anatomic variants: None Review of the MIP images confirms the above findings IMPRESSION: 1. A 60 mL intraparenchymal hematoma centered within the left corona  radiata with extension into the overlying subarachnoid space. Lobar location in combination with patient age raises suspicion for amyloid angiopathy. If rightward midline shift of approximately 6 mm. 2. Small focus of hemorrhage along the right superior temporal gyrus, measuring 6 mm. Multifocal hemorrhage means metastatic disease must also be considered. 3. No emergent large vessel occlusion or hemodynamically significant stenosis of the head or neck. No aneurysm or vascular malformation. 4. 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 Aortic Atherosclerosis (ICD10-I70.0) and Emphysema (ICD10-J43.9). Critical Value/emergent results were called by telephone at the time of interpretation on 05/04/2023 at 11:04 pm to [facility] , who verbally acknowledged these results. Electronically Signed   By: DeaDeatra Robinsonon M.D.   On: 05/04/2023 23:24   DG Chest Port 1 View  Result Date: 05/04/2023 CLINICAL DATA:  Status post fall off porch. EXAM: PORTABLE CHEST 1 VIEW COMPARISON:  None Available. FINDINGS: The heart size and mediastinal contours are within normal limits. There is marked severity calcification of the aortic arch. Both lungs are clear. The visualized skeletal structures are unremarkable. IMPRESSION: No active disease. Electronically Signed   Aram Candelala M.D.   On: 05/04/2023 23:23   DG Pelvis Portable  Result Date: 05/04/2023 CLINICAL DATA:  Status post fall. EXAM: PORTABLE PELVIS 1-2 VIEWS COMPARISON:  None Available. FINDINGS: There is no evidence of pelvic fracture or diastasis. No pelvic bone lesions are seen. There is marked severity vascular calcification with a radiopaque stent seen within the expected region of the left common iliac artery. IMPRESSION: No acute osseous abnormality. Electronically Signed   Aram Candelala M.D.   On: 05/04/2023 23:22    Vitals:   05/05/23 1133 05/05/23 1156 05/05/23 1200  05/05/23 1230  BP:   127/68   Pulse: 74 (!) 59 60 61  Resp:  20 20 20   Temp:  99 F (37.2 C) 99.1 F (37.3 C) 99.5 F (37.5 C)  SpO2:  100% 100% 100%  Weight:      Height:         PHYSICAL EXAM General:  ill-appearing patient, on ventilator  Psych:  UTA CV: Regular rate and rhythm on monitor Respiratory: Intubated GI: Not assessed   NEURO:  Mental Status: UTA, patient is intubated Speech/Language: UTA, patient is intubated  Cranial Nerves:  II: Patient tracks objects   III, IV, VI: EOMI. Patient tracks objects across midline to the right. Eyelids elevate symmetrically.  V: UTA VII: UTA VIII: hearing intact to voice. IX, X: UTA [id number] shrug 5/5. XII: tongue is midline without fasciculations. Motor: 2/5 strength to R side RUE, RLE, all muscle groups tested: 3/5 Tone: is normal and bulk is normal Sensation: Intact to light touch bilaterally. Extinction absent to light touch to DSS.   Coordination: UTA Gait: deferred   ASSESSMENT/PLAN  Very large intraparenchymal hematoma of left corona radiata into subarachnoid space Etiology: Indeterminate -metastatic RCC vs amyloid versus hypertensive  Continue 3% IV NaCl. Goal NA 150-155 per neurosurgery. Given 250 ml bolus 9/11. Continue to monitor BP. Consider transition from SCDs to lovenox in future. Pending MRI.   CTA head & neck:  60 mL intraparenchymal hematoma within L corona radiata into subarachnoid space. Another hemorrhage along R superior temporal gyrus. No LVO.  MR Brain: Pending 2D Echo: none found LDL No results found for requested labs within last 1095 days. HgbA1c Awaiting VTE prophylaxis - SCDs. On aspirin/plavix at home. Consider transition to lovenox when possible.  Disposition:  TBD  Atrial fibrillation Home Meds: Aspirin/Plavix Continue telemetry monitoring  Hypertension Home meds:  Hydralazine 25 BID, lotensin 40 mg qday, lopressor 50 mg BID  Stable BP goal: 130-150 on IV cleviprex    Tobacco Abuse Longstanding, per chart review  Substance Abuse Unknown  Dysphagia Patient has post-stroke dysphagia, SLP consulted    Diet   Diet NPO time specified   Advance diet as tolerated  Other Stroke Risk Factors Advanced age  Hospital day # 1  Luiz Iron, MD Psychiatry Resident, PGY-1  STROKE MD NOTE :  I have personally obtained history,examined this patient, reviewed notes, independently viewed imaging studies, participated in medical decision making and plan of care.ROS completed by me personally and pertinent positives fully documented  I have made any additions or clarifications directly to the above note. Agree with note above.  Patient presented with decreased responsiveness, left gaze deviation and right hemiparesis secondary to a large 60 cc volume left subcortical hemorrhage with extension into subarachnoid space with 6 mm left-to-right midline shift.  Etiology indeterminate hypertensive versus amyloid and without metastasis given history of renal cell carcinoma.  Continue ventilatory support for respiratory failure as per critical care team.  Strict control of blood pressure with systolic goal below 150 for the first 24 hours and then below 160.  Continue hypertonic saline with serum sodium goal between 150 and 155.  Check MRI scan of the brain later today.  No family available at the bedside.This patient is critically ill and at significant risk of neurological worsening, death and care requires constant monitoring of vital signs, hemodynamics,respiratory and cardiac monitoring, extensive review of multiple databases, frequent neurological assessment, discussion with family, other specialists and medical decision making of high complexity.I have made any additions or clarifications directly to the above note.This critical care time does not reflect procedure time, or teaching time or supervisory time of PA/NP/Med Resident etc but could involve care discussion time.  I  spent 30 minutes of neurocritical care time  in the care of  this patient.       Delia Heady, MD Medical Director [facility] Pager: [phone] 05/05/2023 4:25 PM   To contact Stroke Continuity provider, please refer to [email or web address]. After hours, contact General Neurology

## 2023-05-05 NOTE — Progress Notes (Signed)
    Providing Compassionate, Quality Care - Together   NEUROSURGERY PROGRESS NOTE     S: NAEs o/n.    O: EXAM:  BP (!) 124/110   Pulse 67   Temp 99.7 F (37.6 C)   Resp 18   Ht 6' 0.01" (1.829 m)   Wt 79 kg   SpO2 99%   BMI 23.62 kg/m     Eyes open spontaneously to voice,  Pupils 2mm and equally reactive Follows commands LUE/LLE. Minimal movement of RUE/RLE.   ASSESSMENT:  72 y.o. with multifocal L hemispheric hemorrhage presumably after a fall on Plavix. CTH expectedly mildly worsened since yesterday, slightly improved neurologic exam.     PLAN: -Continue supportive care.  -Na goal 145-150 -SBP <150 -MRI w/ and w/o pending.  -Call w/ questions/concerns.    Patrici Ranks, [facility]

## 2023-05-05 NOTE — Procedures (Addendum)
Patient Name: Jeffrey Stout  MRN: [id number]  Epilepsy Attending: Charlsie Quest  Referring Physician/Provider: Erick Blinks, MD  Date: 05/05/2023 Duration: 25.10 mins  Patient history:  72 yo male who is presumably on Plavix with multifocal hemorrhage including an acute L sided intraparenchymal hemorrhage with additional hemorrhage along R superior temporal gyrus. EEG to evaluate for seizure.   Level of alertness:  lethargic / comatose  AEDs during EEG study: None  Technical aspects: This EEG study was done with scalp electrodes positioned according to the 10-20 International system of electrode placement. Electrical activity was reviewed with band pass filter of 1-70Hz , sensitivity of 7 uV/mm, display speed of 43mm/sec with a 60Hz  notched filter applied as appropriate. EEG data were recorded continuously and digitally stored.  Video monitoring was available and reviewed as appropriate.  Description: EEG showed continuous generalized and lateralized left hemisphere 3 to 5 Hz theta-delta slowing.  Hyperventilation and photic stimulation were not performed.     ABNORMALITY - Continuous slow, generalized and lateralized left hemisphere  IMPRESSION: This study is  suggestive of cortical dysfunction arising from left hemisphere likely secondary to underlying hemorrhage. Additionally there is moderate to severe diffuse encephalopathy. No seizures or epileptiform discharges were seen throughout the recording.  Lavonne Cass Annabelle Harman

## 2023-05-05 NOTE — Progress Notes (Signed)
Pt transported from [facility] to 3M08 via ventilator without any complications

## 2023-05-05 NOTE — Progress Notes (Signed)
OT Cancellation Note  Patient Details Name: Jeffrey Stout MRN: [id number] DOB: 21-Feb-1951   Cancelled Treatment:    Reason Eval/Treat Not Completed: Active bedrest order  [name], OTR/L [facility] Acute Rehabilitation Office: [phone]  [name] 05/05/2023, 1:31 PM

## 2023-05-05 NOTE — Progress Notes (Addendum)
1 unit PRBC ordered for this patient by mistake.  Bedside RN Payton Emerald & Dr. Warrick Parisian notified.  Bedside RN asked to notify day shift RN not to give blood as ordered.  Also noticed PO potassium repletion ordered by mistake & given to patient @ [phone].  Dr. Warrick Parisian notified.  MD to place orders.

## 2023-05-05 NOTE — Progress Notes (Addendum)
NAME:  Jeffrey Stout, MRN:  [id number], DOB:  04/15/1951, LOS: 1 ADMISSION DATE:  14-May-2023, CONSULTATION DATE:  05/05/23 REFERRING MD:  Derry Lory , CHIEF COMPLAINT:  R sided weakness, aphasia    History of Present Illness:  72 yo M PMH long standing smoking hx, HTN, HLD, renal mass s/p R nephrectomy, RAS s/p stenting now on DAPT presented to ED w aphasia and R sided wkness late 05-14-23.   05-14-2023 1630 pt came inside from the backyard with blood on his head and elbows, couldn't recall what happened, threw up and took a nap on the couch. Woke up around 1830 and was aphasic, threw up again, was very unsteady on his feet. Wife helped him to bathroom at this time.   After conferring with a family friend, wife called EMS concerned that the blood on his head and elbows + subsequent mental status changes might have been from a fall.   Presented as Level 1 in this setting. GCS 11, R hemiparesis, aphasic, L gaze preference prompting code stroke. CT H revealed large L temporal ICH + some SAH + small R superior temporal gyrus hemorrhage. He was intubated in this setting. NSGY and PCCM consulted.    Pertinent  Medical History  HTN HLD Tobacco use RAS s/p stent On DAPT S/p R nephrectomy   Significant Hospital Events: Including procedures, antibiotic start and stop dates in addition to other pertinent events   2023-05-14 ED after likely fall, LOC, AMS --> large ICH. Intubated.   Interim History / Subjective:  Sedated on 25 Fentanyl. Awakens to voice. Moves LUE Repeat CTH slightly enlarged. Had K administered incorrectly this AM. Repeat labs ordered for 0900 (K previously was 4.8 at 2236 the night prior)  Objective   Blood pressure 119/67, pulse (!) 59, temperature 99.7 F (37.6 C), resp. rate 20, height 6' 0.01" (1.829 m), weight 79 kg, SpO2 99%.    Vent Mode: PRVC FiO2 (%):  [40 %-100 %] 40 % Set Rate:  [16 bmp-20 bmp] 20 bmp Vt Set:  [620 mL] 620 mL PEEP:  [5 cmH20] 5 cmH20 Plateau Pressure:   [14 cmH20-16 cmH20] 16 cmH20   Intake/Output Summary (Last 24 hours) at 05/05/2023 0747 Last data filed at 05/05/2023 0600 Gross per 24 hour  Intake 1662.79 ml  Output 800 ml  Net 862.79 ml   Filed Weights   05/05/23 0122  Weight: 79 kg    Examination: General: Elderly male, in NAD HENT: ETT secure. Anicteric sclera  Lungs: Normal effort. CTAB Cardiovascular: RRR, no M/R/G Abdomen: thin soft. Healed old R nephrectomy scar  Extremities: abrasion on L knee Neuro: Sedated on fent gtt. Opens eyes to voice, doesn't follow commands GU: foley yellow urine    Assessment & Plan:   Large L corona radiata IPH, extending into subarachnoid space-- with "brain compression" (6.8 x 4.6 x 3.9 cm hematoma -- 60cc volume. 6mm Rightward midline shift) - repeat CTH with slight enlargement of IPH R superior temporal gyrus hemorrhage (6mm) P -Neurology following/managing -SBP goal 130-150  -NSGY following -F/u on MRI brain -Continue HTS, goal Na 150-155 -Frequent Na checks -no chemical vte ppx for now  -no antiplt -elevated HOB -close neuro monitoring  -secondary stroke w/u per neuro   Endotracheally intubated Tobacco use disorder (1-3 ppd x 30some years)  Suspected aspiration  P -VAP, PAD, pulm hygiene -Daily SBT as able -Continue Unasyn  -Continue PRN duoneb   Vomiting - likely in setting of ICH  -Zofran -OGT LIMWS  RAS s/p stent, on DAPT  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  Hyperglycemia -SSI   Best Practice (right click and "Reselect all SmartList Selections" daily)   Diet/type: NPO DVT prophylaxis: SCD GI prophylaxis: PPI Lines: N/A Foley:  Yes, and it is still needed Code Status:  full code Last date of multidisciplinary goals of  care discussion [9/11]   Critical care time: 35 min     Tytan Sandate Celine Mans, [place] [facility] [place] [facility] For pager details, please see AMION or use Epic chat  After 1900, please call ELINK for cross coverage needs 05/05/2023, 7:55 AM

## 2023-05-05 NOTE — Progress Notes (Signed)
PT Cancellation Note  Patient Details Name: Jeffrey Stout MRN: [id number] DOB: [birth year]   Cancelled Treatment:    Reason Eval/Treat Not Completed: Active bedrest order; will follow up when stable for mobility.   Elray Mcgregor 05/05/2023, 9:29 AM Sheran Lawless, PT Acute Rehabilitation Services Office:[phone] 05/05/2023

## 2023-05-05 NOTE — Progress Notes (Signed)
eLink Physician-Brief Progress Note Patient Name: Jeffrey Stout DOB: [birth year] MRN: [id number]   Date of Service  05/05/2023  HPI/Events of Note  Patient admitted with ICH and altered mental status requiring intubation and mechanical ventilation, neurosurgery is following.  eICU Interventions  New Patient Evaluation.        Jeffrey Stout 05/05/2023, 1:59 AM

## 2023-05-05 NOTE — Progress Notes (Signed)
Initial Nutrition Assessment  DOCUMENTATION CODES:   Not applicable  INTERVENTION:  Await plans for nutrition- OGT to LIWS at this time   Start Jevity 1.5 @ 60 ml/hr (1440 ml/day)  Start at 20 ml/hr and advance by 10ml q4 hrs to goal rate  Prosource TF20 daily  TF at goal rate provides 2240 kcal, 112 g protein, 1095 ml fluid    NUTRITION DIAGNOSIS:   Inadequate oral intake related to inability to eat as evidenced by NPO status.  GOAL:   Patient will meet greater than or equal to 90% of their needs  MONITOR:   Diet advancement, TF tolerance  REASON FOR ASSESSMENT:   Ventilator    ASSESSMENT:   72 y.o. male with PMHx including HTN, HLD, tobacco abuse, renal mass s/p R nephrectomy and renal artery stenosis s/p stenting who presents with aphasia and R sided weakness.  Visited patient at bedside who is intubated and resting. His wife is at bedside who reports he is a good eater at baseline and is a "meat and potatoes" kind of guy. She reports he was doing work on the house prior to incident. Patient's wife reports his UBW to be around current weight- 174#.   OGT in place set to LIWS with dark green output   Moderate muscle wasting noted  Patient able to move L side    Plans for MRI-brain today   Labs: CBG 155, Cr 1.56 Meds: Unasyn, insulin, protonix, miralax, senokot   Wt: no wt hx  I/O's: +934 ml   NUTRITION - FOCUSED PHYSICAL EXAM:  Flowsheet Row Most Recent Value  Orbital Region No depletion  Upper Arm Region Unable to assess  Thoracic and Lumbar Region Unable to assess  Buccal Region Mild depletion  Temple Region Moderate depletion  Clavicle Bone Region Moderate depletion  Clavicle and Acromion Bone Region Moderate depletion  Scapular Bone Region Unable to assess  Dorsal Hand Unable to assess  Patellar Region Moderate depletion  Anterior Thigh Region Moderate depletion  Posterior Calf Region Moderate depletion  Edema (RD Assessment) None  Hair  Unable to assess  Eyes Unable to assess  Mouth Unable to assess  Skin Reviewed  Nails Unable to assess       Diet Order:   Diet Order             Diet NPO time specified  Diet effective now                   EDUCATION NEEDS:   Not appropriate for education at this time  Skin:  Skin Assessment: Reviewed RN Assessment  Last BM:  unknown  Height:   Ht Readings from Last 1 Encounters:  05/05/23 6' 0.01" (1.829 m)    Weight:   Wt Readings from Last 1 Encounters:  05/05/23 79 kg    Ideal Body Weight:     BMI:  Body mass index is 23.62 kg/m.  Estimated Nutritional Needs:   Kcal:  0865-7846  Protein:  95-120 g  Fluid:  >/= 2L  Leodis Rains, RDN, LDN  Clinical Nutrition

## 2023-05-06 DIAGNOSIS — I611 Nontraumatic intracerebral hemorrhage in hemisphere, cortical: Secondary | ICD-10-CM | POA: Diagnosis not present

## 2023-05-06 LAB — BASIC METABOLIC PANEL
Anion gap: 9 (ref 5–15)
BUN: 16 mg/dL (ref 8–23)
CO2: 19 mmol/L — ABNORMAL LOW (ref 22–32)
Calcium: 8.6 mg/dL — ABNORMAL LOW (ref 8.9–10.3)
Chloride: 124 mmol/L — ABNORMAL HIGH (ref 98–111)
Creatinine, Ser: 1.37 mg/dL — ABNORMAL HIGH (ref 0.61–1.24)
GFR, Estimated: 54 mL/min — ABNORMAL LOW (ref >60)
Glucose, Bld: 145 mg/dL — ABNORMAL HIGH (ref 70–99)
Potassium: 3.7 mmol/L (ref 3.5–5.1)
Sodium: 152 mmol/L — ABNORMAL HIGH (ref 135–145)

## 2023-05-06 LAB — CBC
HCT: 40.5 % (ref 39.0–52.0)
Hemoglobin: 12.8 g/dL — ABNORMAL LOW (ref 13.0–17.0)
MCH: 31.6 pg (ref 26.0–34.0)
MCHC: 31.6 g/dL (ref 30.0–36.0)
MCV: 100 fL (ref 80.0–100.0)
Platelets: 112 10*3/uL — ABNORMAL LOW (ref 150–400)
RBC: 4.05 MIL/uL — ABNORMAL LOW (ref 4.22–5.81)
RDW: 13.1 % (ref 11.5–15.5)
WBC: 9.5 10*3/uL (ref 4.0–10.5)
nRBC: 0 % (ref 0.0–0.2)

## 2023-05-06 LAB — GLUCOSE, CAPILLARY
Glucose-Capillary: 108 mg/dL — ABNORMAL HIGH (ref 70–99)
Glucose-Capillary: 123 mg/dL — ABNORMAL HIGH (ref 70–99)
Glucose-Capillary: 128 mg/dL — ABNORMAL HIGH (ref 70–99)
Glucose-Capillary: 134 mg/dL — ABNORMAL HIGH (ref 70–99)
Glucose-Capillary: 139 mg/dL — ABNORMAL HIGH (ref 70–99)
Glucose-Capillary: 153 mg/dL — ABNORMAL HIGH (ref 70–99)

## 2023-05-06 LAB — HEMOGLOBIN A1C
Hgb A1c MFr Bld: 6.6 % — ABNORMAL HIGH (ref 4.8–5.6)
Mean Plasma Glucose: 143 mg/dL

## 2023-05-06 LAB — SODIUM: Sodium: 156 mmol/L — ABNORMAL HIGH (ref 135–145)

## 2023-05-06 LAB — TRIGLYCERIDES: Triglycerides: 148 mg/dL (ref <150)

## 2023-05-06 LAB — MAGNESIUM: Magnesium: 2.3 mg/dL (ref 1.7–2.4)

## 2023-05-06 LAB — PHOSPHORUS: Phosphorus: 1.3 mg/dL — ABNORMAL LOW (ref 2.5–4.6)

## 2023-05-06 MED ORDER — LABETALOL HCL 5 MG/ML IV SOLN: 10.0000 mg | INTRAVENOUS | Status: DC | PRN

## 2023-05-06 MED ORDER — BENAZEPRIL HCL 20 MG PO TABS
40.0000 mg | ORAL_TABLET | Freq: Every day | ORAL | Status: DC
  Administered 2023-05-06 – 2023-05-07 (×2): 40 mg
  Filled 2023-05-06 (×3): qty 2

## 2023-05-06 MED ORDER — FUROSEMIDE 10 MG/ML IJ SOLN: 40.0000 mg | Freq: Two times a day (BID) | INTRAMUSCULAR | Status: DC

## 2023-05-06 MED ORDER — HYDRALAZINE HCL 20 MG/ML IJ SOLN
10.0000 mg | INTRAMUSCULAR | Status: DC | PRN
  Administered 2023-05-06: 10 mg via INTRAVENOUS
  Filled 2023-05-06: qty 1

## 2023-05-06 MED ORDER — ENOXAPARIN SODIUM 40 MG/0.4ML IJ SOSY
40.0000 mg | PREFILLED_SYRINGE | Freq: Every day | INTRAMUSCULAR | Status: DC
  Administered 2023-05-06 – 2023-05-07 (×2): 40 mg via SUBCUTANEOUS
  Filled 2023-05-06 (×2): qty 0.4

## 2023-05-06 MED ORDER — POTASSIUM CHLORIDE 20 MEQ PO PACK
40.0000 meq | PACK | Freq: Once | ORAL | Status: AC
  Administered 2023-05-06: 40 meq
  Filled 2023-05-06: qty 2

## 2023-05-06 NOTE — Progress Notes (Signed)
SLP Cancellation Note  Patient Details Name: Carliss Kataoka MRN: [id number] DOB: 01-14-1951   Cancelled treatment:       Reason Eval/Treat Not Completed: Patient not medically ready (on vent). Will f/u as able.     Mahala Menghini., M.A. CCC-SLP Acute Rehabilitation Services Office [phone]  Secure chat preferred  05/06/2023, 7:57 AM

## 2023-05-06 NOTE — Progress Notes (Signed)
Subjective: NAEs  Objective: Vital signs in last 24 hours: Temp:  [99 F (37.2 C)-100.4 F (38 C)] 99 F (37.2 C) (09/12 0930) Pulse Rate:  [50-77] 54 (09/12 0930) Resp:  [11-20] 13 (09/12 0930) BP: (112-151)/(59-74) 149/67 (09/12 0930) SpO2:  [96 %-99 %] 98 % (09/12 0930) FiO2 (%):  [40 %] 40 % (09/12 1333)  Intake/Output from previous day: 09/11 0701 - 09/12 0700 In: 1711.7 [I.V.:1161.7; IV Piggyback:550] Out: 2385 [Urine:2035; Emesis/NG output:350] Intake/Output this shift: Total I/O In: -  Out: 360 [Urine:360]  Intubated Eyes open spontaneously, regards Purposeful movement LUE, no movement RUE  Lab Results: Recent Labs    05/05/23 0857 05/06/23 0715  WBC 9.1 9.5  HGB 15.0 12.8*  HCT 43.2 40.5  PLT 103* 112*   BMET Recent Labs    05/05/23 0857 05/05/23 1841 05/06/23 0049 05/06/23 0713  NA 144   < > 156* 152*  K 4.0  --   --  3.7  CL 114*  --   --  124*  CO2 21*  --   --  19*  GLUCOSE 134*  --   --  145*  BUN 17  --   --  16  CREATININE 1.56*  --   --  1.37*  CALCIUM 8.5*  --   --  8.6*   < > = values in this interval not displayed.    Studies/Results: MR BRAIN W WO CONTRAST  Result Date: 05/05/2023 CLINICAL DATA:  Neuro deficit, acute, stroke suspected. Mental status change of unknown cause. History of renal cell carcinoma. Intracranial hemorrhage on the left. EXAM: MRI HEAD WITHOUT AND WITH CONTRAST TECHNIQUE: Multiplanar, multiecho pulse sequences of the brain and surrounding structures were obtained without and with intravenous contrast. CONTRAST:  8mL GADAVIST GADOBUTROL 1 MMOL/ML IV SOLN COMPARISON:  Head CT earlier same day FINDINGS: Brain: No acute finding affects the brainstem or cerebellum. Few old small vessel cerebellar infarctions. Right cerebral hemisphere shows moderate chronic small-vessel ischemic changes of the white matter. Few scattered punctate foci of hemosiderin deposition on the right related to old infarctions. Within the left  posterior frontal lobe 2 frontoparietal vertex, redemonstrated is an intraparenchymal hematoma with a fluid level. By MRI, this measures approximately 8.6 x 4.9 x 5.7 cm. This is probably a centrally stable since the previous CT. Small amount of adjacent subarachnoid hemorrhage is visible, not appreciably increased. Mass effect appears similar, with left-to-right shift of 6 mm. No ventricular trapping. Very small amount of subdural blood along the left lateral convexity, maximal thickness 2-3 mm. After contrast administration, no abnormal enhancement occurs. Vascular: Major vessels at the base of the brain show flow. Skull and upper cervical spine: Negative Sinuses/Orbits: Mucosal thickening and layering fluid in the left maxillary sinus. Other: None IMPRESSION: 1. No change in the appearance of the brain since the CT earlier same day. Large intraparenchymal hematoma in the left posterior frontal lobe to frontoparietal vertex, measuring 8.6 x 4.9 x 5.7 cm. Small amount of adjacent subarachnoid hemorrhage, not appreciably increased. Mass effect appears similar, with left-to-right shift of 6 mm. No ventricular trapping. Very small amount of subdural blood along the left lateral convexity, maximal thickness 2-3 mm. 2. No abnormal enhancement after contrast administration. 3. Chronic small-vessel ischemic changes elsewhere as outlined above. 4. Mucosal thickening and layering fluid in the left maxillary sinus. Electronically Signed   By: Paulina Fusi M.D.   On: 05/05/2023 15:24   ECHOCARDIOGRAM COMPLETE  Result Date: 05/05/2023    ECHOCARDIOGRAM  REPORT   Patient Name:   Jeffrey Stout Date of Exam: 05/05/2023 Medical Rec #:  [id number]     Height:       72.0 in Accession #:    [id number]    Weight:       174.2 lb Date of Birth:  October 04, 1950     BSA:          2.009 m Patient Age:    72 years      BP:           119/67 mmHg Patient Gender: M             HR:           68 bpm. Exam Location:  Inpatient Procedure: 2D Echo,  Cardiac Doppler and Color Doppler Indications:    Stroke I63.9  History:        Patient has no prior history of Echocardiogram examinations.                 Signs/Symptoms:Hypotension; Risk Factors:Hypertension.  Sonographer:    Lucendia Herrlich Referring Phys: [id number] [facility]  Sonographer Comments: Echo performed with patient supine and on artificial respirator. IMPRESSIONS  1. Poor acoustic windows. limited study. Left ventricular ejection fraction, by estimation, is 60 to 65%. The left ventricle has normal function. The left ventricle has no regional wall motion abnormalities. There is mild left ventricular hypertrophy. Left ventricular diastolic function could not be evaluated.  2. Right ventricular systolic function is normal. The right ventricular size is normal. Tricuspid regurgitation signal is inadequate for assessing PA pressure.  3. The mitral valve was not well visualized. No evidence of mitral valve regurgitation.  4. The aortic valve was not well visualized. Aortic valve regurgitation is not visualized.  5. The inferior vena cava is normal in size with greater than 50% respiratory variability, suggesting right atrial pressure of 3 mmHg. FINDINGS  Left Ventricle: Poor acoustic windows. limited study. Left ventricular ejection fraction, by estimation, is 60 to 65%. The left ventricle has normal function. The left ventricle has no regional wall motion abnormalities. The left ventricular internal cavity size was normal in size. There is mild left ventricular hypertrophy. Left ventricular diastolic function could not be evaluated. Right Ventricle: The right ventricular size is normal. Right ventricular systolic function is normal. Tricuspid regurgitation signal is inadequate for assessing PA pressure. Left Atrium: Left atrial size was normal in size. Right Atrium: Right atrial size was normal in size. Pericardium: Trivial pericardial effusion is present. Mitral Valve: The mitral valve was not well  visualized. No evidence of mitral valve regurgitation. Tricuspid Valve: Tricuspid valve regurgitation is not demonstrated. Aortic Valve: The aortic valve was not well visualized. Aortic valve regurgitation is not visualized. Pulmonic Valve: The pulmonic valve was not well visualized. Aorta: The aortic root and ascending aorta are structurally normal, with no evidence of dilitation. Venous: The inferior vena cava is normal in size with greater than 50% respiratory variability, suggesting right atrial pressure of 3 mmHg. IAS/Shunts: The interatrial septum was not well visualized.  LEFT VENTRICLE PLAX 2D LVIDd:         4.60 cm LVIDs:         3.40 cm LV PW:         1.00 cm LV IVS:        1.10 cm LVOT diam:     2.10 cm LVOT Area:     3.46 cm  IVC IVC diam: 1.80 cm LEFT ATRIUM  Index LA diam:    2.80 cm 1.39 cm/m   AORTA Ao Root diam: 3.40 cm Ao Asc diam:  3.30 cm  SHUNTS Systemic Diam: 2.10 cm Carolan Clines Electronically signed by Carolan Clines Signature Date/Time: 05/05/2023/11:09:41 AM    Final    CT HEAD WO CONTRAST  Result Date: 05/05/2023 CLINICAL DATA:  72 year old male status post fall, altered mental status with intracranial hemorrhage. EXAM: CT HEAD WITHOUT CONTRAST TECHNIQUE: Contiguous axial images were obtained from the base of the skull through the vertex without intravenous contrast. RADIATION DOSE REDUCTION: This exam was performed according to the departmental dose-optimization program which includes automated exposure control, adjustment of the mA and/or kV according to patient size and/or use of iterative reconstruction technique. COMPARISON:  CT head, CTA head and neck yesterday. FINDINGS: Brain: Large and hyperdense left hemisphere intra-axial hemorrhage with some extra-axial extension into the subarachnoid spaces. No convincing intraventricular extension. Layering hematocrit level on series 4, image 24. The intra-axial blood volume is estimated at 113 mL (75 x 52 by 58 mm AP by transverse by  CC), and does appear slightly larger since yesterday (versus 77 by 49 x 55 mm when measured in the same way yesterday, 104 mL). Bilateral subarachnoid hemorrhage, left greater than right. Right temporal lobe hemorrhage is favored to be in the subarachnoid space. No convincing 2nd site of intra-axial blood. Left hemisphere mass effect with rightward midline shift of 5-6 mm (versus 3-4 mm yesterday). Mass effect on the ventricles is stable. No ventriculomegaly. Basilar cisterns remain patent although the suprasellar cistern is slightly effaced. Edema in the left hemisphere with evidence of underlying bilateral white matter disease. No right hemisphere or posterior fossa cortically based infarct identified. Vascular: No suspicious intracranial vascular hyperdensity. Skull: Intact calvarium. Difficult to exclude nondisplaced left maxillary fracture. Sinuses/Orbits: Layering fluid or hemorrhage in the left maxillary sinus redemonstrated. Other paranasal sinuses and mastoids are stable and well aerated. Other: Intubated. Oral enteric tube partially visible. Stable orbit and scalp soft tissues. IMPRESSION: 1. Large left hemisphere Intra-axial Hemorrhage estimated at 113 mL, appears mildly increased since yesterday (104 mL when estimated with the same technique). 2. Bilateral subarachnoid extension of blood, greater on the left. No convincing 2nd site of intra-axial hemorrhage. 3. Intracranial mass effect with rightward midline shift of 5-6 mm, about 1 mm increased from yesterday. 4. No ventriculomegaly.  No new intracranial abnormality. 5. Difficult to exclude posttraumatic hemorrhage in the left maxillary sinus. Electronically Signed   By: Odessa Fleming M.D.   On: 05/05/2023 05:53   EEG adult  Result Date: 05/05/2023 Charlsie Quest, MD     05/05/2023  5:35 AM Patient Name: Winnie Runnion MRN: [id number] Epilepsy Attending: Charlsie Quest Referring Physician/Provider: Erick Blinks, MD Date: 05/05/2023 Duration: 25.10  mins Patient history:  72 yo male who is presumably on Plavix with multifocal hemorrhage including an acute L sided intraparenchymal hemorrhage with additional hemorrhage along R superior temporal gyrus. EEG to evaluate for seizure.  Level of alertness:  lethargic / comatose AEDs during EEG study: None Technical aspects: This EEG study was done with scalp electrodes positioned according to the 10-20 International system of electrode placement. Electrical activity was reviewed with band pass filter of 1-70Hz , sensitivity of 7 uV/mm, display speed of 70mm/sec with a 60Hz  notched filter applied as appropriate. EEG data were recorded continuously and digitally stored.  Video monitoring was available and reviewed as appropriate. Description: EEG showed continuous generalized and lateralized left hemisphere 3 to 5 Hz theta-delta  slowing.  Hyperventilation and photic stimulation were not performed.   ABNORMALITY - Continuous slow, generalized and lateralized left hemisphere IMPRESSION: This study is  suggestive of cortical dysfunction arising from left hemisphere likely secondary to underlying hemorrhage. Additionally there is moderate to severe diffuse encephalopathy. No seizures or epileptiform discharges were seen throughout the recording. Charlsie Quest   DG Abdomen 1 View  Result Date: 05/05/2023 CLINICAL DATA:  OG tube placement. EXAM: ABDOMEN - 1 VIEW COMPARISON:  None Available. FINDINGS: An orogastric tube is seen with its distal tip overlying the expected region of the body of the stomach. Its distal side hole sits approximately 2.0 cm distal to the expected region of the gastroesophageal junction. The bowel gas pattern is normal. Surgical clips are seen overlying the right upper quadrant. A mild amount of radiopaque contrast is seen within the left renal collecting system. IMPRESSION: Orogastric tube positioning, as described above. Electronically Signed   By: Aram Candela M.D.   On: 05/05/2023 00:14    DG Chest Port 1 View  Result Date: 05/05/2023 CLINICAL DATA:  Level 1 trauma, status post intubation. EXAM: PORTABLE CHEST 1 VIEW COMPARISON:  May 04, 2023 (10:29 p.m.) FINDINGS: Since the prior study there is been interval endotracheal tube placement, with its distal tip approximately 2.9 cm from the carina. Interval enteric tube placement is also seen with its distal end extending into the body of the stomach. The heart size and mediastinal contours are within normal limits. Low lung volumes are seen with mild atelectatic changes present within the bilateral lung bases. No pleural effusion or pneumothorax is identified. The visualized skeletal structures are unremarkable. IMPRESSION: 1. Interval endotracheal and enteric tube placement, as described above. 2. Low lung volumes with mild bibasilar atelectasis. Electronically Signed   By: Aram Candela M.D.   On: 05/05/2023 00:13   CT CHEST ABDOMEN PELVIS W CONTRAST  Result Date: 05/04/2023 CLINICAL DATA:  Level 1 trauma.  Fall. EXAM: CT CHEST, ABDOMEN, AND PELVIS WITH CONTRAST TECHNIQUE: Multidetector CT imaging of the chest, abdomen and pelvis was performed following the standard protocol during bolus administration of intravenous contrast. RADIATION DOSE REDUCTION: This exam was performed according to the departmental dose-optimization program which includes automated exposure control, adjustment of the mA and/or kV according to patient size and/or use of iterative reconstruction technique. CONTRAST:  75mL OMNIPAQUE IOHEXOL 350 MG/ML SOLN COMPARISON:  None Available. FINDINGS: CT CHEST FINDINGS Cardiovascular: There is no cardiomegaly or pericardial effusion. Advanced 3 vessel coronary vascular calcification. There is severe calcified and noncalcified plaque of the thoracic aorta. No aneurysmal dilatation or dissection. The origins of the great vessels of the aortic arch and the central pulmonary arteries appear patent. Mediastinum/Nodes: No  hilar or mediastinal adenopathy. The esophagus and thyroid gland are grossly unremarkable. No mediastinal fluid collection. Lungs/Pleura: Background of emphysema. No focal consolidation, pleural effusion, or pneumothorax. Small amount of mucous debris in the trachea. The central airways remain patent. Musculoskeletal: No acute osseous pathology. CT ABDOMEN PELVIS FINDINGS No intra-abdominal free air or free fluid. Hepatobiliary: The liver is unremarkable. There is biliary dilatation, post cholecystectomy. No retained calcified stone noted in the central CBD. Pancreas: Unremarkable. No pancreatic ductal dilatation or surrounding inflammatory changes. Spleen: Normal in size without focal abnormality. Adrenals/Urinary Tract: The left adrenal glands unremarkable. There is a 3.5 cm right adrenal myelolipoma. There is a solitary left kidney. No hydronephrosis. Subcentimeter left renal hypodense lesions are too small to characterize. The left ureter and urinary bladder appear unremarkable. Stomach/Bowel:  There is sigmoid diverticulosis without active inflammatory changes. There is moderate stool throughout the colon. There is no bowel obstruction or active inflammation. The appendix is normal. Vascular/Lymphatic: Advanced aortoiliac atherosclerotic disease. There is a 3 cm partially thrombosed infrarenal abdominal aortic aneurysm. 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 Left common iliac artery stent is noted which is suboptimally evaluated on the CT. There is flow within the left external iliac artery. A femorofemoral bypass graft appears patent. The IVC is unremarkable. No portal venous gas. There is no adenopathy. Reproductive: The prostate and seminal vesicles are grossly unremarkable. Other: None Musculoskeletal: Osteopenia with degenerative changes of the spine. No acute osseous pathology. IMPRESSION: 1. No acute/traumatic  intrathoracic, abdominal, or pelvic pathology. 2. 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 Patent femorofemoral bypass graft. 3. Sigmoid diverticulosis. No bowel obstruction. Normal appendix. 4. A 3.5 cm right adrenal myelolipoma. Recommend follow-up ultrasound every 3 years. This recommendation follows ACR consensus guidelines: White Paper of the ACR Incidental Findings Committee II on Vascular Findings. J Am Coll Radiol 2013; 10:789-794. 5. Aortic Atherosclerosis (ICD10-I70.0) and Emphysema (ICD10-J43.9). These results were called by telephone at the time of interpretation on May 22, 2023 at 11:14 pm to Dr. Derrell Lolling, who verbally acknowledged these results. Electronically Signed   By: Elgie Collard M.D.   On: 2023-05-22 23:58   CT CERVICAL SPINE WO CONTRAST  Result Date: 05/22/23 CLINICAL DATA:  Trauma.  Altered mental status. EXAM: CT CERVICAL SPINE WITHOUT CONTRAST TECHNIQUE: Multidetector CT imaging of the cervical spine was performed without intravenous contrast. Multiplanar CT image reconstructions were also generated. RADIATION DOSE REDUCTION: This exam was performed according to the departmental dose-optimization program which includes automated exposure control, adjustment of the mA and/or kV according to patient size and/or use of iterative reconstruction technique. COMPARISON:  None Available. FINDINGS: Alignment: No acute subluxation. Skull base and vertebrae: No acute fracture.  Osteopenia. Soft tissues and spinal canal: No prevertebral fluid or swelling. No visible canal hematoma. Disc levels:  No acute findings.  Degenerative changes. Upper chest: Emphysema. Other: Bilateral carotid bulb calcified plaques. IMPRESSION: 1. No acute/traumatic cervical spine pathology. 2.  Emphysema (ICD10-J43.9). These results were called by telephone at the time of interpretation on May 22, 2023 at 11:14 pm to Dr. Derrell Lolling, who verbally  acknowledged these results. Electronically Signed   By: Elgie Collard M.D.   On: 05-22-2023 23:34   CT ANGIO HEAD NECK W WO CM  Result Date: 2023/05/22 CLINICAL DATA:  Altered mental status EXAM: CT ANGIOGRAPHY HEAD AND NECK WITH AND WITHOUT CONTRAST TECHNIQUE: Multidetector CT imaging of the head and neck was performed using the standard protocol during bolus administration of intravenous contrast. Multiplanar CT image reconstructions and MIPs were obtained to evaluate the vascular anatomy. Carotid stenosis measurements (when applicable) are obtained utilizing NASCET criteria, using the distal internal carotid diameter as the denominator. RADIATION DOSE REDUCTION: This exam was performed according to the departmental dose-optimization program which includes automated exposure control, adjustment of the mA and/or kV according to patient size and/or use of iterative reconstruction technique. CONTRAST:  75mL OMNIPAQUE IOHEXOL 350 MG/ML SOLN COMPARISON:  None Available. FINDINGS: CT HEAD FINDINGS Brain: There is a massive intraparenchymal hematoma centered within the left corona radiata with extension into the overlying subarachnoid space. There is an apparent hematocrit level within the anterior aspect of the hematoma. The hematoma measures 6.8  x 4.6 x 3.9 cm (60 mL). There is rightward midline shift of approximately 6 mm. Subarachnoid blood extends into the left sylvian fissure. There is a small focus of hemorrhage along the right superior temporal gyrus (series 3, image 19) that measures 6 mm. There is confluent white matter hypoattenuation suggestive of chronic small vessel disease. Vascular: Nonspecific mildly hyperdense left MCA. Otherwise unremarkable. Skull: No skull fracture. Sinuses/Orbits: Fluid in left maxillary sinus. Other: Critical Value/emergent results were called by telephone at the time of interpretation on 06-01-2023 at 11:04 pm to provider [facility] , who verbally acknowledged these  results. Review of the MIP images confirms the above findings CTA NECK FINDINGS Aortic arch: Mixed density atherosclerosis of the aortic arch with bulky predominantly soft plaque of the proximal descending aorta. There is a normal 3 vessel branch pattern. Atherosclerotic disease of both subclavian arteries, worse on the left. There is an atherosclerotic web crossing the proximal left subclavian artery lumen, but the artery remains widely patent. Right carotid system: There is mild calcific atherosclerosis of the right common carotid artery. Moderate atherosclerosis at the bifurcation, but no hemodynamically significant stenosis by NASCET criteria. The more distal ICA is normal. Left carotid system: Mild atherosclerosis of the common carotid artery with calcification at the bifurcation. No hemodynamically significant stenosis. The ICA is normal. Vertebral arteries: Mildly left dominant system. Both vertebral arteries are normal to the skull base. Skeleton: Mild cervical spondylosis.  No acute finding. Other neck: Negative Upper chest: Biapical paraseptal emphysema. Review of the MIP images confirms the above findings CTA HEAD FINDINGS Anterior circulation: --Intracranial internal carotid arteries: Atherosclerotic calcification of the internal carotid arteries at the skull base without hemodynamically significant stenosis. --Anterior cerebral arteries (ACA): Normal. --Middle cerebral arteries (MCA): Normal. Posterior circulation: --Vertebral arteries: Normal --Inferior cerebellar arteries: Normal. --Basilar artery: Normal. --Superior cerebellar arteries: Normal. --Posterior cerebral arteries: Normal. Venous sinuses: Limited visualization due to contrast phase Anatomic variants: None Review of the MIP images confirms the above findings IMPRESSION: 1. A 60 mL intraparenchymal hematoma centered within the left corona radiata with extension into the overlying subarachnoid space. Lobar location in combination with patient  age raises suspicion for amyloid angiopathy. If rightward midline shift of approximately 6 mm. 2. Small focus of hemorrhage along the right superior temporal gyrus, measuring 6 mm. Multifocal hemorrhage means metastatic disease must also be considered. 3. No emergent large vessel occlusion or hemodynamically significant stenosis of the head or neck. No aneurysm or vascular malformation. 4. 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 Aortic Atherosclerosis (ICD10-I70.0) and Emphysema (ICD10-J43.9). Critical Value/emergent results were called by telephone at the time of interpretation on 01-Jun-2023 at 11:04 pm to provider [place] , who verbally acknowledged these results. Electronically Signed   By: Deatra Robinson M.D.   On: June 01, 2023 23:24   DG Chest Port 1 View  Result Date: June 01, 2023 CLINICAL DATA:  Status post fall off porch. EXAM: PORTABLE CHEST 1 VIEW COMPARISON:  None Available. FINDINGS: The heart size and mediastinal contours are within normal limits. There is marked severity calcification of the aortic arch. Both lungs are clear. The visualized skeletal structures are unremarkable. IMPRESSION: No active disease. Electronically Signed   By: Aram Candela M.D.   On: 06/01/2023 23:23   DG Pelvis Portable  Result Date: 01-Jun-2023 CLINICAL DATA:  Status post fall. EXAM: PORTABLE PELVIS 1-2 VIEWS COMPARISON:  None Available. FINDINGS: There is no evidence of pelvic fracture or diastasis. No pelvic  bone lesions are seen. There is marked severity vascular calcification with a radiopaque stent seen within the expected region of the left common iliac artery. IMPRESSION: No acute osseous abnormality. Electronically Signed   By: Aram Candela M.D.   On: 06-02-2023 23:22    Assessment/Plan: 72 yo M with hx of RCC who suffered large left frontoparietal hemorrhage from DAPT with possible amyloid angiopathy and/or trauma, less  likely metastatic RCC. - generally, I would consider surgery for large hematoma such as this as there is weak data that suggests this may help with rate of recovery though not his ultimate functional outcome. However, with his DAPT, risks of hemorrhagic complications from surgery exceed these weak benefits, so I would not recommend surgery except in the case of progressive neurologic decline with concomitant increasing mass effect.  Potential for surgery can be reassessed next week after Plavix effect has run its course. - he likely will need f/u MRI brain with/without contrast in 6 weeks for reassessment of any underlying brain mass/pathology that may have contributed to his ICH.  Bedelia Person 05/06/2023, 1:43 PM

## 2023-05-06 NOTE — Progress Notes (Signed)
NAME:  Jeffrey Stout, MRN:  [id number], DOB:  07/29/1951, LOS: 2 ADMISSION DATE:  05/20/2023, CONSULTATION DATE:  9/11 REFERRING MD:  Derry Lory, CHIEF COMPLAINT:  R sided weakness, aphasia   History of Present Illness:  Jeffrey Stout is a 72 y.o male with PMH smoking, HTN, HLD, Renal mass s/p R nephrectomy, RAS s/p stenting now on DAPT presented to ED w aphasia and R sided weakness late 2023/05/20.  2023/05/20 1630 pt came inside from the backyard with blood on his head and elbows, couldn't recall what happened, threw up and took a nap on the couch. Woke up around 1830 and was aphasic, threw up again, was very unsteady on his feet. Wife helped him to bathroom at this time.   After conferring with a family friend, wife called EMS concerned that the blood on his head and elbows + subsequent mental status changes might have been from a fall.   Presented as Level 1 in this setting. GCS 11, R hemiparesis, aphasic, L gaze preference prompting code stroke. CT H revealed large L temporal ICH + some SAH + small R superior temporal gyrus hemorrhage. He was intubated in this setting. NSGY and PCCM consulted.  Patient has started to wake. Sedation was cut off this AM. Following commands. SBT in process, if successful will extubate.  Pertinent  Medical History  HTN HLD Tobacco use RAS s/p stent On DAPT S/p R nephrectomy   Significant Hospital Events: Including procedures, antibiotic start and stop dates in addition to other pertinent events   May 20, 2023 ED after likely fall, LOC, AMS --> large ICH. Intubated.   Interim History / Subjective:  Patient wakes and follows commands.   Objective   Blood pressure 137/67, pulse (!) 51, temperature 99.3 F (37.4 C), resp. rate 20, height 6' 0.01" (1.829 m), weight 79 kg, SpO2 98%.    Vent Mode: PSV;CPAP FiO2 (%):  [40 %] 40 % Set Rate:  [20 bmp] 20 bmp Vt Set:  [620 mL] 620 mL PEEP:  [5 cmH20] 5 cmH20 Pressure Support:  [10 cmH20] 10 cmH20 Plateau Pressure:  [8  cmH20-17 cmH20] 15 cmH20   Intake/Output Summary (Last 24 hours) at 05/06/2023 0814 Last data filed at 05/06/2023 0600 Gross per 24 hour  Intake 1711.71 ml  Output 2385 ml  Net -673.29 ml   Filed Weights   05/05/23 0122  Weight: 79 kg    Examination: General: NAD HENT: ETT secure Lungs: Normal effort, CTAB Cardiovascular: RRR, no MRG Abdomen: Soft, not distended. BS + Extremities: Abrasion on L knee, no edema Neuro: Opens eyes to voice, follows commands GU: Foley in place  Assessment & Plan:  Large L corona radiata IPH R superior temporal gyrus hemorrhage  MRI w/wo contrast showing stable hematomas, no abnormal enhancement with contrast administration.  -Neurology following/managing -SBP goal 130-150  -NSGY following -Continue HTS, goal Na 150-155 -Frequent Na checks -no chemical vte ppx for now  -no antiplt -elevated HOB -close neuro monitoring -Secondary stroke w/u per neuro   Endotracheally intubated Tobacco use disorder  Suspected aspiration  Patient awake and following commands. Will wean sedation.  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

## 2023-05-06 NOTE — TOC CM/SW Note (Signed)
Transition of Care [facility]) - Inpatient Brief Assessment   Patient Details  Name: Jeffrey Stout MRN: [id number] Date of Birth: 09-16-1950  Transition of [facility]) CM/SW Contact:    Tom-Johnson, Hershal Coria, RN Phone Number: 05/06/2023, 1:37 PM   Clinical Narrative:  Patient presented to the ED after a fall at home and hitting back of head with abrasion. Patient less responsive. Admitted with Intracerebral Hemorrhage, Neurology following. On Plavix and ASA for Kidney Stent placement, has Rt Nephrostomy.   Patient currently Intubated and Sedated.  No TOC needs or recommendations noted at this time.  Patient not Medically ready for discharge.  CM will continue to follow as patient progresses with care towards discharge.   Transition of Care Asessment: Insurance and Status: Insurance coverage has been reviewed Patient has primary care physician: Yes Home environment has been reviewed: Yes Prior level of function:: Independent Prior/Current Home Services: No current home services Social Determinants of Health Reivew: SDOH reviewed no interventions necessary Readmission risk has been reviewed: Yes Transition of care needs: transition of care needs identified, TOC will continue to follow

## 2023-05-06 NOTE — Plan of Care (Signed)
  Problem: Nutrition: Goal: Risk of aspiration will decrease Outcome: Progressing   Problem: Activity: Goal: Ability to tolerate increased activity will improve Outcome: Progressing   Problem: Coping: Goal: Level of anxiety will decrease Outcome: Progressing

## 2023-05-06 NOTE — Progress Notes (Signed)
Brief Nutrition Support Note  Discussed pt with RN and during ICU rounds. Extubation being considered today. Discussed starting nutrition support with PCCM Attending and Resident. Per PCCM Resident, GOC discussions with family are ongoing. Plan is to hold off on starting enteral nutrition at this time.  RD will continue to follow pt during admission.   MerMertie Clausese, MS, RD, LDN Registered Dietitian II Please see AMiON for contact information.

## 2023-05-06 NOTE — Progress Notes (Addendum)
Spoke with patient's spouse Kendal Hymen and family. Discussed patient's prognosis. Family would like to proceed with extubation tomorrow, and request they be present prior to extubation. Kendal Hymen and family explicitly state that patient would not want CPR/re-intubation if he were unable to breath on his own or develop cardiac arrest after extubation. Kendal Hymen and family request patient be made DNR. Kendal Hymen and family do not yet wish to pursue hospice nor palliative care at this time. They wish to see how he does after extubation prior to making final decision between comfort care and rehabilitation.   Patient will be made DNR-limited starting now, however extubation will occur tomorrow. Family expressed understanding and agreement with plan.  This conversation was mediated by Tiffany Kocher D.O and DNR decision was witnessed by nurse Charise Killian RN.

## 2023-05-06 NOTE — Progress Notes (Signed)
OT Cancellation Note  Patient Details Name: Kenjuan Hrivnak MRN: [id number] DOB: 1951-08-09   Cancelled Treatment:    Reason Eval/Treat Not Completed: Medical issues which prohibited therapy (Pt remains intubated and with limited arousal. Per RN family continues deciding on POC. Will follow up as schedule allows and pt ready.)  [name], OTR/L [facility] Acute Rehabilitation Office: [phone]   [name] 05/06/2023, 2:38 PM

## 2023-05-06 NOTE — Progress Notes (Addendum)
STROKE TEAM PROGRESS NOTE   BRIEF HPI [name] is a 72 y.o. male with history of HTN, HLD, renal cell carcinoma s/p R nephrectomy, renal artery stenting on DAPT (aspirin/plavix), presenting with R sided weakness, AMS, left gaze preference after a fall -- found to have large left corona radiata IPH extending into subarachnoid space (hematoma 60cc volume). Additionally, found small hemorrhage along R superior temporal gyrus, raising suspicion for metastatic RCC. Midline shift of 6mm. GCS 12. BP 228/116. Hypoxic to mid-80s. Patient lost consciousness, requiring intubation. EEG negative.   SIGNIFICANT HOSPITAL EVENTS 9/10: Admission, CTA Head/Neck: no LVO. CT Head: multiple hemorrhages, rightward mass effect. Intubation 9/11: Patient is intubated and sedated on exam but opens eyes. Follows commands. Vision crosses midline while tracking to the right. Noted R leg weakness. SCDs placed.   INTERIM HISTORY/SUBJECTIVE  On interview, patient was intubated but more alert than yesterday.  Patient still cannot follow commands.  Spontaneous left-sided movement, remains almost entirely plegic on right, with trace movements.  Continued left gaze preference.  On reinterview later in day, discussed with family likely outcomes if extubated.  While there is a chance he could show some recovery, he likely cannot protect his airway, raising aspiration pneumonia risk.  Family will think about DNR/DNI.    OBJECTIVE  CBC    Component Value Date/Time   WBC 9.5 05/06/2023 0715   RBC 4.05 (L) 05/06/2023 0715   HGB 12.8 (L) 05/06/2023 0715   HCT 40.5 05/06/2023 0715   PLT 112 (L) 05/06/2023 0715   MCV 100.0 05/06/2023 0715   MCH 31.6 05/06/2023 0715   MCHC 31.6 05/06/2023 0715   RDW 13.1 05/06/2023 0715    BMET    Component Value Date/Time   NA 152 (H) 05/06/2023 0713   K 3.7 05/06/2023 0713   CL 124 (H) 05/06/2023 0713   CO2 19 (L) 05/06/2023 0713   GLUCOSE 145 (H) 05/06/2023 0713   BUN 16  05/06/2023 0713   CREATININE 1.37 (H) 05/06/2023 0713   CALCIUM 8.6 (L) 05/06/2023 0713   GFRNONAA 54 (L) 05/06/2023 0713    IMAGING past 24 hours MR BRAIN W WO CONTRAST  Result Date: 05/05/2023 CLINICAL DATA:  Neuro deficit, acute, stroke suspected. Mental status change of unknown cause. History of renal cell carcinoma. Intracranial hemorrhage on the left. EXAM: MRI HEAD WITHOUT AND WITH CONTRAST TECHNIQUE: Multiplanar, multiecho pulse sequences of the brain and surrounding structures were obtained without and with intravenous contrast. CONTRAST:  8mL GADAVIST GADOBUTROL 1 MMOL/ML IV SOLN COMPARISON:  Head CT earlier same day FINDINGS: Brain: No acute finding affects the brainstem or cerebellum. Few old small vessel cerebellar infarctions. Right cerebral hemisphere shows moderate chronic small-vessel ischemic changes of the white matter. Few scattered punctate foci of hemosiderin deposition on the right related to old infarctions. Within the left posterior frontal lobe 2 frontoparietal vertex, redemonstrated is an intraparenchymal hematoma with a fluid level. By MRI, this measures approximately 8.6 x 4.9 x 5.7 cm. This is probably a centrally stable since the previous CT. Small amount of adjacent subarachnoid hemorrhage is visible, not appreciably increased. Mass effect appears similar, with left-to-right shift of 6 mm. No ventricular trapping. Very small amount of subdural blood along the left lateral convexity, maximal thickness 2-3 mm. After contrast administration, no abnormal enhancement occurs. Vascular: Major vessels at the base of the brain show flow. Skull and upper cervical spine: Negative Sinuses/Orbits: Mucosal thickening and layering fluid in the left maxillary sinus. Other: None IMPRESSION: 1.  No change in the appearance of the brain since the CT earlier same day. Large intraparenchymal hematoma in the left posterior frontal lobe to frontoparietal vertex, measuring 8.6 x 4.9 x 5.7 cm. Small  amount of adjacent subarachnoid hemorrhage, not appreciably increased. Mass effect appears similar, with left-to-right shift of 6 mm. No ventricular trapping. Very small amount of subdural blood along the left lateral convexity, maximal thickness 2-3 mm. 2. No abnormal enhancement after contrast administration. 3. Chronic small-vessel ischemic changes elsewhere as outlined above. 4. Mucosal thickening and layering fluid in the left maxillary sinus. Electronically Signed   By: Paulina Fusi M.D.   On: 05/05/2023 15:24    Vitals:   05/06/23 0800 05/06/23 0830 05/06/23 0900 05/06/23 0930  BP: (!) 146/67  (!) 151/60 (!) 149/67  Pulse: (!) 59 (!) 53 (!) 54 (!) 54  Resp: 16 11 13 13   Temp: 99.3 F (37.4 C) 99.1 F (37.3 C) 99 F (37.2 C) 99 F (37.2 C)  TempSrc:      SpO2: 99% 98% 99% 98%  Weight:      Height:         PHYSICAL EXAM General:  ill-appearing patient, on ventilator  Psych:  UTA CV: Regular rate and rhythm on monitor Respiratory: Intubated GI: Not assessed   NEURO:  Mental Status: UTA, patient is intubated Speech/Language: UTA, patient is intubated  Cranial Nerves:  II: Unable to assess III, IV, VI: EOMI. Left gaze preference.  Patient tracks objects across midline to the right. Eyelids elevate symmetrically.  V: UTA VII: UTA VIII: hearing intact to voice. IX, X: UTA [id number] shrug 5/5. XII: tongue is midline without fasciculations. Motor: 2/5 strength to R side RUE, RLE, all muscle groups tested: 3/5 Tone: is normal and bulk is normal Sensation: Intact to light touch bilaterally. Extinction absent to light touch to DSS.   Coordination: UTA Gait: deferred   ASSESSMENT/PLAN  Very large intraparenchymal hematoma of left corona radiata into subarachnoid space Etiology: Indeterminate -metastatic RCC vs amyloid versus hypertensive  Plan is for extubation sometime today.  MRI brain 9/11 showed no change in appearance from previous CT.  Transitioned to Lovenox  from SCDs.  Restarted home benazepril via NGT.  Added PRN hydralazine/labetalol, anticipating need for more intensive blood pressure control during extubation.  Hypertonic saline discontinued at Na: 156.  Continue goals of CARE discussion with family.  Per family, patient does not want to go to a nursing home, but they did not discuss intubation or resuscitation.  CTA head & neck: 60 mL intraparenchymal hematoma within L corona radiata into subarachnoid space. Another hemorrhage along R superior temporal gyrus. No LVO.  MR Brain: Large intraparenchymal hematoma in left posterior frontal lobe 2D Echo: Negative LDL No results found for requested labs within last 1095 days. HgbA1c 6.6 VTE prophylaxis -transitioned to Lovenox. Disposition:  TBD  Atrial fibrillation Home Meds: Aspirin/Plavix Continue telemetry monitoring  Hypertension Home meds:  Hydralazine 25 BID, lotensin 40 mg qday, lopressor 50 mg BID, continue Lotensin Stable BP goal: 130-150 on IV cleviprex   Tobacco Abuse Longstanding, per chart review  Dysphagia Patient has post-stroke dysphagia, SLP consulted    Diet   Diet NPO time specified   Advance diet as tolerated  Other Stroke Risk Factors Advanced age  Hospital day # 2  Luiz Iron, MD Psychiatry Resident, PGY-1  STROKE MD NOTE :  I have personally obtained history,examined this patient, reviewed notes, independently viewed imaging studies, participated in medical decision making and plan  of care.ROS completed by me personally and pertinent positives fully documented  I have made any additions or clarifications directly to the above note. Agree with note above.  Patient remains intubated but is weaning and extubation as per critical care team.  Remains aphasic with dense right hemiplegia and left gaze preference.  Long discussion at bedside with the patient wife and son regarding his prognosis and plan of care.  Family needs to consider DNR/DNI versus reintubation  and trach PEG and nursing home care.  Wife states patient would not want.  Family needs more time to discuss amongst himself's and come up with goals of care.  Continue strict blood pressure control with systolic goal less than 160.  Discussed with Dr. Isaiah Serge critical medicine.This patient is critically ill and at significant risk of neurological worsening, death and care requires constant monitoring of vital signs, hemodynamics,respiratory and cardiac monitoring, extensive review of multiple databases, frequent neurological assessment, discussion with family, other specialists and medical decision making of high complexity.I have made any additions or clarifications directly to the above note.This critical care time does not reflect procedure time, or teaching time or supervisory time of PA/NP/Med Resident etc but could involve care discussion time.  I spent 50 minutes of neurocritical care time  in the care of  this patient.      Delia Heady, MD Medical Director [facility] Pager: [phone] 05/06/2023 2:24 PM   To contact Stroke Continuity provider, please refer to [email or web address]. After hours, contact General Neurology

## 2023-05-06 NOTE — Progress Notes (Signed)
PT Cancellation Note  Patient Details Name: Jeffrey Stout MRN: [id number] DOB: Dec 19, 1950   Cancelled Treatment:    Reason Eval/Treat Not Completed: Patient not medically ready (pt remains intubated with limited response, RN hopeful for extubation today and will hold til post extubation)   Tineka Uriegas B Leopoldo Mazzie 05/06/2023, 7:41 AM Merryl Hacker, PT Acute Rehabilitation Services Office: [phone]

## 2023-05-07 DIAGNOSIS — I611 Nontraumatic intracerebral hemorrhage in hemisphere, cortical: Secondary | ICD-10-CM | POA: Diagnosis not present

## 2023-05-07 DIAGNOSIS — S0635AA Traumatic hemorrhage of left cerebrum with loss of consciousness status unknown, initial encounter: Secondary | ICD-10-CM | POA: Diagnosis not present

## 2023-05-07 LAB — BASIC METABOLIC PANEL
Anion gap: 14 (ref 5–15)
BUN: 16 mg/dL (ref 8–23)
CO2: 23 mmol/L (ref 22–32)
Calcium: 9.3 mg/dL (ref 8.9–10.3)
Chloride: 119 mmol/L — ABNORMAL HIGH (ref 98–111)
Creatinine, Ser: 1.36 mg/dL — ABNORMAL HIGH (ref 0.61–1.24)
GFR, Estimated: 55 mL/min — ABNORMAL LOW (ref >60)
Glucose, Bld: 128 mg/dL — ABNORMAL HIGH (ref 70–99)
Potassium: 3.4 mmol/L — ABNORMAL LOW (ref 3.5–5.1)
Sodium: 156 mmol/L — ABNORMAL HIGH (ref 135–145)

## 2023-05-07 LAB — GLUCOSE, CAPILLARY
Glucose-Capillary: 132 mg/dL — ABNORMAL HIGH (ref 70–99)
Glucose-Capillary: 103 mg/dL — ABNORMAL HIGH (ref 70–99)
Glucose-Capillary: 112 mg/dL — ABNORMAL HIGH (ref 70–99)

## 2023-05-07 MED ORDER — MORPHINE 100MG IN NS 100ML (1MG/ML) PREMIX INFUSION
0.0000 mg/h | INTRAVENOUS | Status: DC
  Administered 2023-05-07: 5 mg/h via INTRAVENOUS
  Administered 2023-05-08 (×3): 10 mg/h via INTRAVENOUS
  Filled 2023-05-07 (×4): qty 100

## 2023-05-07 MED ORDER — ACETAMINOPHEN 650 MG RE SUPP
650.0000 mg | Freq: Four times a day (QID) | RECTAL | Status: DC | PRN
  Administered 2023-05-07: 650 mg via RECTAL

## 2023-05-07 MED ORDER — ACETAMINOPHEN 325 MG PO TABS: 650.0000 mg | ORAL_TABLET | Freq: Four times a day (QID) | ORAL | Status: DC | PRN

## 2023-05-07 MED ORDER — GLYCOPYRROLATE 0.2 MG/ML IJ SOLN
0.2000 mg | INTRAMUSCULAR | Status: DC | PRN
  Administered 2023-05-07: 0.2 mg via INTRAVENOUS
  Filled 2023-05-07: qty 1

## 2023-05-07 MED ORDER — MORPHINE BOLUS VIA INFUSION: 5.0000 mg | INTRAVENOUS | Status: DC | PRN

## 2023-05-07 MED ORDER — GLYCOPYRROLATE 0.2 MG/ML IJ SOLN: 0.2000 mg | Freq: Four times a day (QID) | INTRAMUSCULAR | Status: DC | PRN

## 2023-05-07 MED ORDER — GLYCOPYRROLATE 0.2 MG/ML IJ SOLN: 0.2000 mg | INTRAMUSCULAR | Status: DC | PRN

## 2023-05-07 MED ORDER — GLYCOPYRROLATE 1 MG PO TABS: 1.0000 mg | ORAL_TABLET | ORAL | Status: DC | PRN

## 2023-05-07 MED ORDER — SODIUM CHLORIDE 0.9 % IV SOLN: INTRAVENOUS | Status: DC

## 2023-05-07 MED ORDER — POLYVINYL ALCOHOL 1.4 % OP SOLN: 1.0000 [drp] | Freq: Four times a day (QID) | OPHTHALMIC | Status: DC | PRN

## 2023-05-07 NOTE — Progress Notes (Addendum)
STROKE TEAM PROGRESS NOTE   BRIEF HPI [name] is a 72 y.o. male with history of HTN, HLD, renal cell carcinoma s/p R nephrectomy, renal artery stenting on DAPT (aspirin/plavix), presenting with R sided weakness, AMS, left gaze preference after a fall -- found to have large left corona radiata IPH extending into subarachnoid space (hematoma 60cc volume). Additionally, found small hemorrhage along R superior temporal gyrus, raising suspicion for metastatic RCC. Midline shift of 6mm. GCS 12. BP 228/116. Hypoxic to mid-80s. Patient lost consciousness, requiring intubation. EEG negative.   SIGNIFICANT HOSPITAL EVENTS 9/10: Admission, CTA Head/Neck: no LVO. CT Head: multiple hemorrhages, rightward mass effect. Intubation 9/11: Patient is intubated and sedated on exam but opens eyes. Follows commands. Vision crosses midline while tracking to the right. Noted R leg weakness. SCDs placed.  9/12: Patient not following commands.  Spontaneous left-sided movement, remain speech [facility] right.  Family decided on extubation tomorrow with DNR/DNI.  INTERIM HISTORY/SUBJECTIVE  On interview, patient's neuro status is at baseline.  Continue left gaze preference.  Spontaneous left-sided movements, minimal right sided responsiveness.  Does not follow commands.  Per primary team, patient's family is deciding to DNR/DNI but do not want comfort care as they would like to give him a chance to recover.  However they believe that if things were to deteriorate he would not want to be reintubated and would transition to comfort care at that time.    OBJECTIVE  CBC    Component Value Date/Time   WBC 9.5 05/06/2023 0715   RBC 4.05 (L) 05/06/2023 0715   HGB 12.8 (L) 05/06/2023 0715   HCT 40.5 05/06/2023 0715   PLT 112 (L) 05/06/2023 0715   MCV 100.0 05/06/2023 0715   MCH 31.6 05/06/2023 0715   MCHC 31.6 05/06/2023 0715   RDW 13.1 05/06/2023 0715    BMET    Component Value Date/Time   NA 156 (H) 05/07/2023  0856   K 3.4 (L) 05/07/2023 0856   CL 119 (H) 05/07/2023 0856   CO2 23 05/07/2023 0856   GLUCOSE 128 (H) 05/07/2023 0856   BUN 16 05/07/2023 0856   CREATININE 1.36 (H) 05/07/2023 0856   CALCIUM 9.3 05/07/2023 0856   GFRNONAA 55 (L) 05/07/2023 0856    IMAGING past 24 hours No results found.  Vitals:   05/07/23 0430 05/07/23 0500 05/07/23 0530 05/07/23 0600  BP: (!) 144/66 139/65 (!) 153/60 (!) 151/66  Pulse: (!) 55 (!) 49 (!) 47 (!) 51  Resp: 20 20 20 20   Temp: 99.3 F (37.4 C) 99.3 F (37.4 C) 99.5 F (37.5 C) 99.3 F (37.4 C)  TempSrc:      SpO2: 99% 99% 99% 100%  Weight:      Height:         PHYSICAL EXAM General:  ill-appearing patient, on ventilator  Psych:  UTA CV: Regular rate and rhythm on monitor Respiratory: Intubated GI: Not assessed   NEURO:  Mental Status: UTA, patient is intubated Speech/Language: UTA, patient is intubated  Cranial Nerves:  II: Unable to assess III, IV, VI: EOMI. Left gaze preference.  Patient not reliably tracking objects.  No blink to threat on right side, present on left.  Eyelids elevate symmetrically.  V: UTA VII: UTA VIII: hearing intact to voice. IX, X: UTA [id number] shrug 5/5. XII: tongue is midline without fasciculations. Motor: 2/5 strength to R side RUE, RLE, all muscle groups tested: 3/5 Tone: is normal and bulk is normal Sensation: Intact to light touch bilaterally.  Extinction absent to light touch to DSS.   Coordination: UTA Gait: deferred  Overall: Unchanged from yesterday.   ASSESSMENT/PLAN  Very large intraparenchymal hematoma of left corona radiata into subarachnoid space Etiology: Indeterminate -metastatic RCC vs amyloid versus hypertensive  Extubation today.  Now DNR/DNI.  Comfort measures if patient worsens after extubation.  Continue Lovenox.  Continue home benazepril with as needed hydralazine/labetalol for blood pressure control during extubation.  Goal remains SBPs under 160.  CTA head & neck:  60 mL intraparenchymal hematoma within L corona radiata into subarachnoid space. Another hemorrhage along R superior temporal gyrus. No LVO.  MR Brain: Large intraparenchymal hematoma in left posterior frontal lobe 2D Echo: Negative LDL No results found for requested labs within last 1095 days. HgbA1c 6.6 VTE prophylaxis -transitioned to Lovenox. Disposition:  TBD  Atrial fibrillation Home Meds: Aspirin/Plavix Continue telemetry monitoring  Hypertension Home meds:  Hydralazine 25 BID, lotensin 40 mg qday, lopressor 50 mg BID, continue Lotensin Stable BP goal: 130-150 on IV cleviprex   Tobacco Abuse Longstanding, per chart review  Dysphagia Patient has post-stroke dysphagia, SLP consulted    Diet   Diet NPO time specified   Advance diet as tolerated  Other Stroke Risk Factors Advanced age  Hospital day # 3  Luiz Iron, MD Psychiatry Resident, PGY-1  I have personally obtained history,examined this patient, reviewed notes, independently viewed imaging studies, participated in medical decision making and plan of care.ROS completed by me personally and pertinent positives fully documented  I have made any additions or clarifications directly to the above note. Agree with note above.  Patient remains aphasic with dense right hemiplegia.  I had a long discussion with family members yesterday including his wife and they agreed to DNR and one-way extubation but would like to give him a chance to see how he does.  If he has significant deterioration they will agree to comfort care.  Discussed with Dr. Isaiah Serge critical care medicine and patient's wife and family and answered questions.This patient is critically ill and at significant risk of neurological worsening, death and care requires constant monitoring of vital signs, hemodynamics,respiratory and cardiac monitoring, extensive review of multiple databases, frequent neurological assessment, discussion with family, other specialists and  medical decision making of high complexity.I have made any additions or clarifications directly to the above note.This critical care time does not reflect procedure time, or teaching time or supervisory time of PA/NP/Med Resident etc but could involve care discussion time.  I spent 30 minutes of neurocritical care time  in the care of  this patient.      Delia Heady, MD Medical Director [facility] Pager: [phone] 05/07/2023 1:11 PM   To contact Stroke Continuity provider, please refer to [email or web address]. After hours, contact General Neurology

## 2023-05-07 NOTE — Progress Notes (Signed)
PCCM note  Patient's been weaning on 5/5 since morning.  Mental status is still poor Discussed with family and told them that there is a good chance that he may not be able to maintain his airway They do want to proceed with extubation with DNR in place Transition to comfort measures if he does not do well.  Chilton Greathouse MD [facility] See Amion for pager  If no response to pager , please call [phone] until 7pm After 7:00 pm call Elink  [phone] 05/07/2023, 1:37 PM

## 2023-05-07 NOTE — Progress Notes (Signed)
PT Cancellation Note  Patient Details Name: Phoenix Drey MRN: [id number] DOB: 29-Apr-1951   Cancelled Treatment:    Reason Eval/Treat Not Completed: Patient not medically ready Planning for extubation today, family still deciding GOC.  Aleda Grana, PT, DPT 05/07/23, 10:01 AM   Sandi Mariscal 05/07/2023, 10:00 AM

## 2023-05-07 NOTE — Progress Notes (Signed)
SLP Cancellation Note  Patient Details Name: Jeffrey Stout MRN: [id number] DOB: 1951/03/11   Cancelled treatment:        Pt intubated at present with plans for extubation today. GOC pending extubation.  SLP will hold SLE awaiting determination of rehab v comfort care.  If swallow evaluation is indicated, please place orders.  ST will be available over the weekend for consults.  Please reach out to [facility] SLP group via secure chat or call acute rehab dept at [phone].  Kerrie Pleasure, MA, CCC-SLP Acute Rehabilitation Services Office: [phone] 05/07/2023, 8:54 AM

## 2023-05-07 NOTE — TOC Progression Note (Signed)
Transition of Care [facility]) - Progression Note    Patient Details  Name: Jeffrey Stout MRN: [id number] Date of Birth: 1951-06-30  Transition of [facility]) CM/SW Contact  Tom-Johnson, Hershal Coria, RN Phone Number: 05/07/2023, 4:16 PM  Clinical Narrative:     Pt had one way Extubation today and transitioned to Comfort Care per family's request. On 4L [place].  Chaplain consulted for End Of Life Spiritual Care.   CM will continue to follow and render compassionate support at this transitioning time.          Expected Discharge Plan and Services                                               Social Determinants of Health (SDOH) Interventions    Readmission Risk Interventions    05/06/2023    1:36 PM  Readmission Risk Prevention Plan  Transportation Screening Complete  PCP or Specialist Appt within 5-7 Days Complete  Home Care Screening Complete  Medication Review (RN CM) Referral to Pharmacy

## 2023-05-07 NOTE — Progress Notes (Signed)
PCCM note  Patient extubated and immediately started having stridor and agonal breathing Discussed with family and will transition to comfort measures per their request  Chilton Greathouse MD [facility] See Amion for pager  If no response to pager , please call [phone] until 7pm After 7:00 pm call Elink  [phone] 05/07/2023, 2:01 PM

## 2023-05-07 NOTE — Progress Notes (Signed)
NAME:  Jeffrey Stout, MRN:  [id number], DOB:  [birth year], LOS: 3 ADMISSION DATE:  05/04/2023, CONSULTATION DATE:  05/05/23 REFERRING MD:  Derry Lory, CHIEF COMPLAINT:  R sided weakness, aphasia   History of Present Illness:  Jeffrey Stout is a 72 y.o male with PMH smoking, HTN, HLD, Renal mass s/p R nephrectomy, RAS s/p stenting now on DAPT presented to ED w aphasia and R sided weakness late 9/10.   9/10 1630 pt came inside from the backyard with blood on his head and elbows, couldn't recall what happened, threw up and took a nap on the couch. Woke up around 1830 and was aphasic, threw up again, was very unsteady on his feet. Wife helped him to bathroom at this time.   After conferring with a family friend, wife called EMS concerned that the blood on his head and elbows + subsequent mental status changes might have been from a fall.   Presented as Level 1 in this setting. GCS 11, R hemiparesis, aphasic, L gaze preference prompting code stroke. CT H revealed large L temporal ICH + some SAH + small R superior temporal gyrus hemorrhage. He was intubated in this setting. NSGY and PCCM consulted.   Patient wakes, cut sedation this AM. Plan to wean and extubate today. Family has indicated he is DNR-limited, see note on 9/12 @5 :02PM by Dr. Claudean Severance for details.  Pertinent  Medical History  HTN HLD Tobacco use RAS s/p stent On DAPT S/p R nephrectomy   Significant Hospital Events: Including procedures, antibiotic start and stop dates in addition to other pertinent events   9/10 ED after likely fall, LOC, AMS --> large ICH. Intubated.  9/12 CODE STATUS changed to DNR-limited  Interim History / Subjective:  Patient wakes to voice, moving his left upper and lower extremity.  Objective   Blood pressure (!) 151/66, pulse (!) 51, temperature 99.3 F (37.4 C), resp. rate 20, height 6' 0.01" (1.829 m), weight 79 kg, SpO2 100%.    Vent Mode: PRVC FiO2 (%):  [40 %] 40 % Set Rate:  [20 bmp] 20  bmp Vt Set:  [620 mL] 620 mL PEEP:  [5 cmH20] 5 cmH20 Pressure Support:  [5 cmH20] 5 cmH20 Plateau Pressure:  [14 cmH20-15 cmH20] 15 cmH20   Intake/Output Summary (Last 24 hours) at 05/07/2023 0804 Last data filed at 05/07/2023 0600 Gross per 24 hour  Intake 474.76 ml  Output 1505 ml  Net -1030.24 ml   Filed Weights   05/05/23 0122  Weight: 79 kg   Examination: General: NAD HENT: EET secure Lungs: CTAB Cardiovascular: RRR, no murmurs Abdomen: Soft, not distended. BS + Extremities: Moving left U/L extremity. Unable to move right side of body. Neuro: Opens eyes to sound, unable to follow verbal commands but follows gestures.  Assessment & Plan:  Large L corona radiata IPH R superior temporal gyrus hemorrhage  Still pending complete goals of care by family. No surgical intervention per NSGY. -Neurology following/managing -SBP goal 130-150  -Continue HTS, goal Na 150-155 -no antiplt -elevated HOB -close neuro monitoring  Endotracheally intubated Tobacco use disorder  Suspected aspiration  Patient awake. Aphasic, unable to follow commands. Plan to wean today, extubate when able. Family understands extubation may not be compatible with life and requested DNR-limited.  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

## 2023-05-07 NOTE — Procedures (Signed)
Extubation Procedure Note  Patient Details:   Name: Jeffrey Stout DOB: 05/07/1951 MRN: [id number]   Airway Documentation:    Vent end date: 05/07/23 Vent end time: 1404   Evaluation  O2 sats: stable throughout Complications: No apparent complications Patient did tolerate procedure well. Bilateral Breath Sounds: Clear, Diminished   No  Pt had a one way extubation per order and placed on 4L [place]. Pt had a slight cuff leak, a weak cough and had developed some stridor after extubation. MD and family notified.   Kerri Perches 05/07/2023, 2:05 PM

## 2023-05-07 NOTE — Progress Notes (Signed)
OT Cancellation Note  Patient Details Name: Jeffrey Stout MRN: [id number] DOB: August 18, 1951   Cancelled Treatment:    Reason Eval/Treat Not Completed: Medical issues which prohibited therapy (Extubation planned for today. Family considering POC options.)  Raynald Kemp, OT Acute Rehabilitation Services Office: [phone]   Pilar Grammes 05/07/2023, 9:54 AM

## 2023-05-07 NOTE — Progress Notes (Signed)
   05/07/23 1440  Spiritual Encounters  Type of Visit Initial  Care provided to: Family  Referral source Family  Reason for visit End-of-life  OnCall Visit No  Spiritual Framework  Presenting Themes Significant life change;Impactful experiences and emotions  Community/Connection Family  Patient Stress Factors Health changes  Family Stress Factors Loss  Interventions  Spiritual Care Interventions Made Compassionate presence;Prayer  Intervention Outcomes  Outcomes Connection to spiritual care;Awareness of support  Spiritual Care Plan  Spiritual Care Issues Still Outstanding No further spiritual care needs at this time (see row info)   Patient's family requested a chaplain for EOL care. Patient's spouse requested prayer. Chaplain prayed over the patient. Patient's sons, daughter in laws and his grandson were also present.   Arlyce Dice, Chaplain Resident [phone]

## 2023-05-08 DIAGNOSIS — I611 Nontraumatic intracerebral hemorrhage in hemisphere, cortical: Secondary | ICD-10-CM | POA: Diagnosis not present

## 2023-05-08 MED ORDER — ONDANSETRON 4 MG PO TBDP: 4.0000 mg | ORAL_TABLET | Freq: Four times a day (QID) | ORAL | Status: DC | PRN

## 2023-05-08 MED ORDER — BIOTENE DRY MOUTH MT LIQD: 15.0000 mL | OROMUCOSAL | Status: DC | PRN

## 2023-05-08 MED ORDER — HALOPERIDOL 0.5 MG PO TABS: 0.5000 mg | ORAL_TABLET | ORAL | Status: DC | PRN

## 2023-05-08 MED ORDER — HALOPERIDOL LACTATE 2 MG/ML PO CONC: 0.5000 mg | ORAL | Status: DC | PRN

## 2023-05-08 MED ORDER — HALOPERIDOL LACTATE 5 MG/ML IJ SOLN: 0.5000 mg | INTRAMUSCULAR | Status: DC | PRN

## 2023-05-08 MED ORDER — ONDANSETRON HCL 4 MG/2ML IJ SOLN: 4.0000 mg | Freq: Four times a day (QID) | INTRAMUSCULAR | Status: DC | PRN

## 2023-05-08 NOTE — Progress Notes (Incomplete)
STROKE TEAM PROGRESS NOTE   BRIEF HPI [name] is a 72 y.o. male with history of HTN, HLD, renal cell carcinoma s/p R nephrectomy, renal artery stenting on DAPT (aspirin/plavix), presenting with R sided weakness, AMS, left gaze preference after a fall -- found to have large left corona radiata IPH extending into subarachnoid space (hematoma 60cc volume). Additionally, found small hemorrhage along R superior temporal gyrus, raising suspicion for metastatic RCC. Midline shift of 6mm. GCS 12. BP 228/116. Hypoxic to mid-80s. Patient lost consciousness, requiring intubation. EEG negative.   SIGNIFICANT HOSPITAL EVENTS 9/10: Admission, CTA Head/Neck: no LVO. CT Head: multiple hemorrhages, rightward mass effect. Intubation 9/11: Patient is intubated and sedated on exam but opens eyes. Follows commands. Vision crosses midline while tracking to the right. Noted R leg weakness. SCDs placed.  9/12: Patient not following commands.  Spontaneous left-sided movement, remain speech [facility] right.  Family decided on extubation tomorrow with DNR/DNI. 9/13: patient extubated, transitioned to comfort care.   INTERIM HISTORY/SUBJECTIVE  Patient on comfort care. Available as needed.     OBJECTIVE  CBC    Component Value Date/Time   WBC 9.5 05/06/2023 0715   RBC 4.05 (L) 05/06/2023 0715   HGB 12.8 (L) 05/06/2023 0715   HCT 40.5 05/06/2023 0715   PLT 112 (L) 05/06/2023 0715   MCV 100.0 05/06/2023 0715   MCH 31.6 05/06/2023 0715   MCHC 31.6 05/06/2023 0715   RDW 13.1 05/06/2023 0715    BMET    Component Value Date/Time   NA 156 (H) 05/07/2023 0856   K 3.4 (L) 05/07/2023 0856   CL 119 (H) 05/07/2023 0856   CO2 23 05/07/2023 0856   GLUCOSE 128 (H) 05/07/2023 0856   BUN 16 05/07/2023 0856   CREATININE 1.36 (H) 05/07/2023 0856   CALCIUM 9.3 05/07/2023 0856   GFRNONAA 55 (L) 05/07/2023 0856    IMAGING past 24 hours No results found.  Vitals:   05/08/23 0300 05/08/23 0400 05/08/23 0500  05/08/23 0600  BP:      Pulse: 81 76 78 77  Resp: 16 19 17 18   Temp: 99.5 F (37.5 C) 98.6 F (37 C) 98.4 F (36.9 C) 98.4 F (36.9 C)  TempSrc:  Bladder    SpO2: (!) 63% (!) 65% (!) 63% (!) 63%  Weight:      Height:         PHYSICAL EXAM General:  ill-appearing patient, on ventilator  Psych:  UTA CV: Regular rate and rhythm on monitor Respiratory: Intubated GI: Not assessed   NEURO:  Mental Status: UTA, patient is intubated Speech/Language: UTA, patient is intubated  Cranial Nerves:  II: Unable to assess III, IV, VI: EOMI. Left gaze preference.  Patient not reliably tracking objects.  No blink to threat on right side, present on left.  Eyelids elevate symmetrically.  V: UTA VII: UTA VIII: hearing intact to voice. IX, X: UTA [id number] shrug 5/5. XII: tongue is midline without fasciculations. Motor: 2/5 strength to R side RUE, RLE, all muscle groups tested: 3/5 Tone: is normal and bulk is normal Sensation: Intact to light touch bilaterally. Extinction absent to light touch to DSS.   Coordination: UTA Gait: deferred  Overall: Unchanged from yesterday.   ASSESSMENT/PLAN  Very large intraparenchymal hematoma of left corona radiata into subarachnoid space Etiology: Indeterminate -metastatic RCC vs amyloid versus hypertensive  Extubation today.  Now DNR/DNI.  Comfort measures if patient worsens after extubation.  Continue Lovenox.  Continue home benazepril with as needed hydralazine/labetalol  for blood pressure control during extubation.  Goal remains SBPs under 160.  CTA head & neck: 60 mL intraparenchymal hematoma within L corona radiata into subarachnoid space. Another hemorrhage along R superior temporal gyrus. No LVO.  MR Brain: Large intraparenchymal hematoma in left posterior frontal lobe 2D Echo: Negative LDL No results found for requested labs within last 1095 days. HgbA1c 6.6 VTE prophylaxis -transitioned to Lovenox. Disposition:  TBD  Atrial  fibrillation Home Meds: Aspirin/Plavix Continue telemetry monitoring  Hypertension Home meds:  Hydralazine 25 BID, lotensin 40 mg qday, lopressor 50 mg BID, continue Lotensin Stable BP goal: 130-150 on IV cleviprex   Tobacco Abuse Longstanding, per chart review  Dysphagia Patient has post-stroke dysphagia, SLP consulted    Diet   Diet NPO time specified   Advance diet as tolerated  Other Stroke Risk Factors Advanced age  Hospital day # 4     To contact Stroke Continuity provider, please refer to [email or web address]. After hours, contact General Neurology

## 2023-05-08 NOTE — Progress Notes (Signed)
STROKE TEAM PROGRESS NOTE   BRIEF HPI [name] is a 72 y.o. male with history of HTN, HLD, renal cell carcinoma s/p R nephrectomy, renal artery stenting on DAPT (aspirin/plavix), presenting with R sided weakness, AMS, left gaze preference after a fall -- found to have large left corona radiata IPH extending into subarachnoid space (hematoma 60cc volume). Additionally, found small hemorrhage along R superior temporal gyrus, raising suspicion for metastatic RCC. Midline shift of 6mm. GCS 12. BP 228/116. Hypoxic to mid-80s. Patient lost consciousness, requiring intubation. EEG negative.   SIGNIFICANT HOSPITAL EVENTS 9/10: Admission, CTA Head/Neck: no LVO. CT Head: multiple hemorrhages, rightward mass effect. Intubation 9/11: Patient is intubated and sedated on exam but opens eyes. Follows commands. Vision crosses midline while tracking to the right. Noted R leg weakness. SCDs placed.  9/12: Patient not following commands.  Spontaneous left-sided movement, remain speech [facility] right.  Family decided on extubation tomorrow with DNR/DNI. 9/13: comfort care measures  INTERIM HISTORY/SUBJECTIVE Wife and children are at the bedside. Pt was extubated yesterday and then had respiratory distress. CCM discussed with family and put on full comfort care measures. Now pt on morphine drip and comfortable.   OBJECTIVE  CBC    Component Value Date/Time   WBC 9.5 05/06/2023 0715   RBC 4.05 (L) 05/06/2023 0715   HGB 12.8 (L) 05/06/2023 0715   HCT 40.5 05/06/2023 0715   PLT 112 (L) 05/06/2023 0715   MCV 100.0 05/06/2023 0715   MCH 31.6 05/06/2023 0715   MCHC 31.6 05/06/2023 0715   RDW 13.1 05/06/2023 0715    BMET    Component Value Date/Time   NA 156 (H) 05/07/2023 0856   K 3.4 (L) 05/07/2023 0856   CL 119 (H) 05/07/2023 0856   CO2 23 05/07/2023 0856   GLUCOSE 128 (H) 05/07/2023 0856   BUN 16 05/07/2023 0856   CREATININE 1.36 (H) 05/07/2023 0856   CALCIUM 9.3 05/07/2023 0856   GFRNONAA 55  (L) 05/07/2023 0856    IMAGING past 24 hours No results found.  Vitals:   05/08/23 0734 05/08/23 0800 05/08/23 0900 05/08/23 1000  BP: 102/60     Pulse: 82 80 80 80  Resp: 16 19 (!) 21 (!) 22  Temp: 98.6 F (37 C) 98.6 F (37 C) 98.6 F (37 C) 99 F (37.2 C)  TempSrc: Bladder     SpO2: (!) 60% (!) 62% (!) 61% (!) 64%  Weight:      Height:         PHYSICAL EXAM General:  ill-appearing patient, unresponsive Respiratory: not in distress Neuro: limited exam due to comfort care measure, pt eyes closed, not open on voice, no spontaneous movement   ASSESSMENT/PLAN  ICH - left frontal large ICH, etiology unclear, amyloid angiopathy vs. hypertensive CT head large intraparenchymal hematoma centered within the left corona radiata with extension into the overlying subarachnoid space. There is rightward midline shift of approximately 6 mm. Subarachnoid blood extends into the left sylvian fissure.  CTA head & neck No emergent large vessel occlusion or hemodynamically significant stenosis of the head or neck. CT repeat Large left hemisphere Intra-axial Hemorrhage estimated at 113 mL, appears mildly increased since yesterday. Bilateral subarachnoid extension of blood, greater on the left. No convincing 2nd site of intra-axial hemorrhage. MR Brain with and without Large intraparenchymal hematoma in left posterior frontal lobe. No abnormal enhancement after contrast administration.  2D Echo EF 60-65% LDL cancelled for comfort care HgbA1c 6.6 UDS neg VTE prophylaxis -  was on Lovenox. Now off for comfort care Disposition:  full comfort care measures  Cerebral edema Was on 3% but now off Na 156  Hypertension Home meds:  Hydralazine 25 BID, lotensin 40 mg qday, lopressor 50 mg BID, continue Lotensin Stable Was on lotensin Now on comfort care measures  Fever  Tmax 101.1 Could be central fever vs. Aspiration  Was on unasyn  Now on comfort care measures  Tobacco abuse Current long  standing smoker  Other Stroke Risk Factors Advanced age  Other acute issues RCC s/p nephrectomy AKI on CKD Cre 1.56->1.36  Hospital day # 4  Marvel Plan, MD PhD Stroke Neurology 05/08/2023 5:46 PM    To contact Stroke Continuity provider, please refer to [email or web address]. After hours, contact General Neurology

## 2023-05-08 NOTE — Plan of Care (Signed)
  Problem: Education: Goal: Knowledge of disease or condition will improve 05/08/2023 1124 by Darryl Nestle, RN Outcome: Completed/Met 05/08/2023 1001 by Darryl Nestle, RN Outcome: Not Progressing Goal: Knowledge of secondary prevention will improve (MUST DOCUMENT ALL) 05/08/2023 1124 by Darryl Nestle, RN Outcome: Completed/Met 05/08/2023 1001 by Darryl Nestle, RN Outcome: Not Progressing Goal: Knowledge of patient specific risk factors will improve Loraine Leriche N/A or DELETE if not current risk factor) 05/08/2023 1124 by Darryl Nestle, RN Outcome: Completed/Met 05/08/2023 1001 by Darryl Nestle, RN Outcome: Not Progressing

## 2023-05-08 NOTE — Progress Notes (Signed)
PCCM note  Patient is now on full comfort measures PCCM will sign off.  Please call with any questions.  Chilton Greathouse MD [place] [facility] See Amion for pager  If no response to pager , please call [phone] until 7pm After 7:00 pm call Elink  [phone] 05/08/2023, 9:31 AM

## 2023-05-08 NOTE — Plan of Care (Signed)
  Problem: Education: Goal: Knowledge of disease or condition will improve Outcome: Not Progressing Goal: Knowledge of secondary prevention will improve (MUST DOCUMENT ALL) Outcome: Not Progressing Goal: Knowledge of patient specific risk factors will improve Loraine Leriche N/A or DELETE if not current risk factor) Outcome: Not Progressing   Problem: Intracerebral Hemorrhage Tissue Perfusion: Goal: Complications of Intracerebral Hemorrhage will be minimized Outcome: Not Progressing   Problem: Coping: Goal: Will verbalize positive feelings about self Outcome: Not Progressing Goal: Will identify appropriate support needs Outcome: Progressing   Problem: Health Behavior/Discharge Planning: Goal: Ability to manage health-related needs will improve Outcome: Not Progressing Goal: Goals will be collaboratively established with patient/family Outcome: Progressing   Problem: Self-Care: Goal: Ability to participate in self-care as condition permits will improve Outcome: Not Progressing Goal: Verbalization of feelings and concerns over difficulty with self-care will improve Outcome: Not Progressing Goal: Ability to communicate needs accurately will improve Outcome: Not Progressing   Problem: Nutrition: Goal: Risk of aspiration will decrease Outcome: Progressing Goal: Dietary intake will improve Outcome: Progressing

## 2023-05-10 ENCOUNTER — Encounter: Payer: Self-pay | Admitting: Critical Care Medicine

## 2023-05-25 NOTE — Progress Notes (Signed)
2023/05/17 0408  Attending Physican Contact  Attending Physician Notified Y  Attending Physician (First and Last Name) Erick Blinks MD  Post Mortem Checklist  Date of Death 05/17/23  Time of Death 0345  Pronounced By Mendel Corning RN/ D. Steffanie Dunn RN  Next of kin notified Yes  Name of next of kin notified of death Jeffrey Stout  Contact Person's Relationship to Patient Spouse  Contact Person's Phone Number [phone]  Contact Person's address [address] [place] [address]  Was the patient a No Code Blue or a Limited Code Blue? Yes  Did the patient die unattended? Yes  Patient restrained? Not applicable  Height 6' (1.829 m)  Weight 79 kg  Body preparation complete Y  [employer] (previously known as Administrator, sports)  Notification Date 05/07/23  Notification Time 1740  [employer] Number [phone]  Is patient a potential donor? Y  Donation Type Tissue;Eyes  Autopsy  Autopsy requested by MD or Family ( Non ME Case) N/A  Patient and Hospital Property Returned  Patient is satisfied that all belongings have been returned? Yes  Name of person receiving valuables? N/A  Dermatherapy linen/gowns NOT sent with patient or transporter Disposable Patient Transfer/ Apparel Kit used  Dead on Arrival (Emergency Department)  Patient dead on arrival? No  Medical Examiner  Is this a medical examiner's case? N  Funeral Home  Funeral home name/address/phone # [facility] unknown/ undecided  Planned location of pickup [place]

## 2023-05-25 NOTE — Death Summary Note (Signed)
DEATH SUMMARY   Patient Details  Name: Jeffrey Stout MRN: [id number] DOB: May 24, 1951 XLK:[id number], Jeffrey Stabs, MD Admission/Discharge Information   Admit Date:  04/28/2023  Date of Death: Date of Death: 05-14-23  Time of Death: Time of Death: 0345  Length of Stay: 5   Principle Cause of death:  ICH - left frontal large ICH, etiology unclear, amyloid angiopathy vs. hypertensive  Cerebral edema Brain herniation  Hospital Diagnoses:   HTN   Acute hypoxic respiratory failure (HCC)   Fever    AKI (acute kidney injury) (HCC)   Tobacco abuse   RCC s/p nephrectomy   Hospital Course: Jeffrey Stout is a 72 y.o. male with history of HTN, HLD, renal cell carcinoma s/p R nephrectomy, renal artery stenting on DAPT (aspirin/plavix), presenting with R sided weakness, AMS, left gaze preference after a fall -- found to have large left corona radiata IPH extending into subarachnoid space (hematoma 60cc volume). Additionally, found small hemorrhage along R superior temporal gyrus, raising suspicion for metastatic RCC. Midline shift of 6mm. GCS 12. BP 228/116. Hypoxic to mid-80s. Patient lost consciousness, requiring intubation. EEG negative.    SIGNIFICANT HOSPITAL EVENTS 04/26/2023: Admission, CTA Head/Neck: no LVO. CT Head: multiple hemorrhages, rightward mass effect. Intubation 9/11: Patient is intubated and sedated on exam but opens eyes. Follows commands. Vision crosses midline while tracking to the right. Noted R leg weakness. SCDs placed.  9/12: Patient not following commands.  Spontaneous left-sided movement, remain speech [facility] right.  Family decided on extubation tomorrow with DNR/DNI. 9/13: comfort care measures  Assessment and Plan: ICH - left frontal large ICH, etiology unclear, amyloid angiopathy vs. hypertensive CT head large intraparenchymal hematoma centered within the left corona radiata with extension into the overlying subarachnoid space. There is rightward midline shift of  approximately 6 mm. Subarachnoid blood extends into the left sylvian fissure.  CTA head & neck No emergent large vessel occlusion or hemodynamically significant stenosis of the head or neck. CT repeat Large left hemisphere Intra-axial Hemorrhage estimated at 113 mL, appears mildly increased since yesterday. Bilateral subarachnoid extension of blood, greater on the left. No convincing 2nd site of intra-axial hemorrhage. MR Brain with and without Large intraparenchymal hematoma in left posterior frontal lobe. No abnormal enhancement after contrast administration.  2D Echo EF 60-65% LDL cancelled for comfort care HgbA1c 6.6 UDS neg VTE prophylaxis - was on Lovenox. Now off for comfort care Pt passed away 14-May-2023 0345   Cerebral edema Was on 3% but now off Na 156   Hypertension Home meds:  Hydralazine 25 BID, lotensin 40 mg qday, lopressor 50 mg BID, continue Lotensin Stable Was on lotensin comfort care measures   Fever  Tmax 101.1 Could be central fever vs. Aspiration  Was on unasyn  comfort care measures   Tobacco abuse long standing smoker   Other Stroke Risk Factors Advanced age   Other acute issues RCC s/p nephrectomy AKI on CKD Cre 1.56->1.36  Procedures: intubation and extubation  Consultations: neurosurgery, PCCM  The results of significant diagnostics from this hospitalization (including imaging, microbiology, ancillary and laboratory) are listed below for reference.   Significant Diagnostic Studies: MR BRAIN W WO CONTRAST  Result Date: 05/05/2023 CLINICAL DATA:  Neuro deficit, acute, stroke suspected. Mental status change of unknown cause. History of renal cell carcinoma. Intracranial hemorrhage on the left. EXAM: MRI HEAD WITHOUT AND WITH CONTRAST TECHNIQUE: Multiplanar, multiecho pulse sequences of the brain and surrounding structures were obtained without and with intravenous contrast. CONTRAST:  8mL  GADAVIST GADOBUTROL 1 MMOL/ML IV SOLN COMPARISON:  Head CT  earlier same day FINDINGS: Brain: No acute finding affects the brainstem or cerebellum. Few old small vessel cerebellar infarctions. Right cerebral hemisphere shows moderate chronic small-vessel ischemic changes of the white matter. Few scattered punctate foci of hemosiderin deposition on the right related to old infarctions. Within the left posterior frontal lobe 2 frontoparietal vertex, redemonstrated is an intraparenchymal hematoma with a fluid level. By MRI, this measures approximately 8.6 x 4.9 x 5.7 cm. This is probably a centrally stable since the previous CT. Small amount of adjacent subarachnoid hemorrhage is visible, not appreciably increased. Mass effect appears similar, with left-to-right shift of 6 mm. No ventricular trapping. Very small amount of subdural blood along the left lateral convexity, maximal thickness 2-3 mm. After contrast administration, no abnormal enhancement occurs. Vascular: Major vessels at the base of the brain show flow. Skull and upper cervical spine: Negative Sinuses/Orbits: Mucosal thickening and layering fluid in the left maxillary sinus. Other: None IMPRESSION: 1. No change in the appearance of the brain since the CT earlier same day. Large intraparenchymal hematoma in the left posterior frontal lobe to frontoparietal vertex, measuring 8.6 x 4.9 x 5.7 cm. Small amount of adjacent subarachnoid hemorrhage, not appreciably increased. Mass effect appears similar, with left-to-right shift of 6 mm. No ventricular trapping. Very small amount of subdural blood along the left lateral convexity, maximal thickness 2-3 mm. 2. No abnormal enhancement after contrast administration. 3. Chronic small-vessel ischemic changes elsewhere as outlined above. 4. Mucosal thickening and layering fluid in the left maxillary sinus. Electronically Signed   By: Jeffrey Stout M.D.   On: 05/05/2023 15:24   ECHOCARDIOGRAM COMPLETE  Result Date: 05/05/2023    ECHOCARDIOGRAM REPORT   Patient Name:   Jeffrey Stout Date of Exam: 05/05/2023 Medical Rec #:  [id number]     Height:       72.0 in Accession #:    [id number]    Weight:       174.2 lb Date of Birth:  18-Dec-1950     BSA:          2.009 m Patient Age:    72 years      BP:           119/67 mmHg Patient Gender: M             HR:           68 bpm. Exam Location:  Inpatient Procedure: 2D Echo, Cardiac Doppler and Color Doppler Indications:    Stroke I63.9  History:        Patient has no prior history of Echocardiogram examinations.                 Signs/Symptoms:Hypotension; Risk Factors:Hypertension.  Sonographer:    Lucendia Herrlich Referring Phys: [id number] [facility]  Sonographer Comments: Echo performed with patient supine and on artificial respirator. IMPRESSIONS  1. Poor acoustic windows. limited study. Left ventricular ejection fraction, by estimation, is 60 to 65%. The left ventricle has normal function. The left ventricle has no regional wall motion abnormalities. There is mild left ventricular hypertrophy. Left ventricular diastolic function could not be evaluated.  2. Right ventricular systolic function is normal. The right ventricular size is normal. Tricuspid regurgitation signal is inadequate for assessing PA pressure.  3. The mitral valve was not well visualized. No evidence of mitral valve regurgitation.  4. The aortic valve was not well visualized. Aortic valve regurgitation is not visualized.  5. The inferior vena cava is normal in size with greater than 50% respiratory variability, suggesting right atrial pressure of 3 mmHg. FINDINGS  Left Ventricle: Poor acoustic windows. limited study. Left ventricular ejection fraction, by estimation, is 60 to 65%. The left ventricle has normal function. The left ventricle has no regional wall motion abnormalities. The left ventricular internal cavity size was normal in size. There is mild left ventricular hypertrophy. Left ventricular diastolic function could not be evaluated. Right Ventricle: The right  ventricular size is normal. Right ventricular systolic function is normal. Tricuspid regurgitation signal is inadequate for assessing PA pressure. Left Atrium: Left atrial size was normal in size. Right Atrium: Right atrial size was normal in size. Pericardium: Trivial pericardial effusion is present. Mitral Valve: The mitral valve was not well visualized. No evidence of mitral valve regurgitation. Tricuspid Valve: Tricuspid valve regurgitation is not demonstrated. Aortic Valve: The aortic valve was not well visualized. Aortic valve regurgitation is not visualized. Pulmonic Valve: The pulmonic valve was not well visualized. Aorta: The aortic root and ascending aorta are structurally normal, with no evidence of dilitation. Venous: The inferior vena cava is normal in size with greater than 50% respiratory variability, suggesting right atrial pressure of 3 mmHg. IAS/Shunts: The interatrial septum was not well visualized.  LEFT VENTRICLE PLAX 2D LVIDd:         4.60 cm LVIDs:         3.40 cm LV PW:         1.00 cm LV IVS:        1.10 cm LVOT diam:     2.10 cm LVOT Area:     3.46 cm  IVC IVC diam: 1.80 cm LEFT ATRIUM         Index LA diam:    2.80 cm 1.39 cm/m   AORTA Ao Root diam: 3.40 cm Ao Asc diam:  3.30 cm  SHUNTS Systemic Diam: 2.10 cm Carolan Clines Electronically signed by Carolan Clines Signature Date/Time: 05/05/2023/11:09:41 AM    Final    CT HEAD WO CONTRAST  Result Date: 05/05/2023 CLINICAL DATA:  72 year old male status post fall, altered mental status with intracranial hemorrhage. EXAM: CT HEAD WITHOUT CONTRAST TECHNIQUE: Contiguous axial images were obtained from the base of the skull through the vertex without intravenous contrast. RADIATION DOSE REDUCTION: This exam was performed according to the departmental dose-optimization program which includes automated exposure control, adjustment of the mA and/or kV according to patient size and/or use of iterative reconstruction technique. COMPARISON:  CT head,  CTA head and neck yesterday. FINDINGS: Brain: Large and hyperdense left hemisphere intra-axial hemorrhage with some extra-axial extension into the subarachnoid spaces. No convincing intraventricular extension. Layering hematocrit level on series 4, image 24. The intra-axial blood volume is estimated at 113 mL (75 x 52 by 58 mm AP by transverse by CC), and does appear slightly larger since yesterday (versus 77 by 49 x 55 mm when measured in the same way yesterday, 104 mL). Bilateral subarachnoid hemorrhage, left greater than right. Right temporal lobe hemorrhage is favored to be in the subarachnoid space. No convincing 2nd site of intra-axial blood. Left hemisphere mass effect with rightward midline shift of 5-6 mm (versus 3-4 mm yesterday). Mass effect on the ventricles is stable. No ventriculomegaly. Basilar cisterns remain patent although the suprasellar cistern is slightly effaced. Edema in the left hemisphere with evidence of underlying bilateral white matter disease. No right hemisphere or posterior fossa cortically based infarct identified. Vascular: No suspicious intracranial vascular  hyperdensity. Skull: Intact calvarium. Difficult to exclude nondisplaced left maxillary fracture. Sinuses/Orbits: Layering fluid or hemorrhage in the left maxillary sinus redemonstrated. Other paranasal sinuses and mastoids are stable and well aerated. Other: Intubated. Oral enteric tube partially visible. Stable orbit and scalp soft tissues. IMPRESSION: 1. Large left hemisphere Intra-axial Hemorrhage estimated at 113 mL, appears mildly increased since yesterday (104 mL when estimated with the same technique). 2. Bilateral subarachnoid extension of blood, greater on the left. No convincing 2nd site of intra-axial hemorrhage. 3. Intracranial mass effect with rightward midline shift of 5-6 mm, about 1 mm increased from yesterday. 4. No ventriculomegaly.  No new intracranial abnormality. 5. Difficult to exclude posttraumatic  hemorrhage in the left maxillary sinus. Electronically Signed   By: Odessa Fleming M.D.   On: 05/05/2023 05:53   EEG adult  Result Date: 05/05/2023 Charlsie Quest, MD     05/05/2023  5:35 AM Patient Name: Sherri Balbuena MRN: [id number] Epilepsy Attending: Charlsie Quest Referring Physician/Provider: Erick Blinks, MD Date: 05/05/2023 Duration: 25.10 mins Patient history:  72 yo male who is presumably on Plavix with multifocal hemorrhage including an acute L sided intraparenchymal hemorrhage with additional hemorrhage along R superior temporal gyrus. EEG to evaluate for seizure.  Level of alertness:  lethargic / comatose AEDs during EEG study: None Technical aspects: This EEG study was done with scalp electrodes positioned according to the 10-20 International system of electrode placement. Electrical activity was reviewed with band pass filter of 1-70Hz , sensitivity of 7 uV/mm, display speed of 80mm/sec with a 60Hz  notched filter applied as appropriate. EEG data were recorded continuously and digitally stored.  Video monitoring was available and reviewed as appropriate. Description: EEG showed continuous generalized and lateralized left hemisphere 3 to 5 Hz theta-delta slowing.  Hyperventilation and photic stimulation were not performed.   ABNORMALITY - Continuous slow, generalized and lateralized left hemisphere IMPRESSION: This study is  suggestive of cortical dysfunction arising from left hemisphere likely secondary to underlying hemorrhage. Additionally there is moderate to severe diffuse encephalopathy. No seizures or epileptiform discharges were seen throughout the recording. Charlsie Quest   DG Abdomen 1 View  Result Date: 05/05/2023 CLINICAL DATA:  OG tube placement. EXAM: ABDOMEN - 1 VIEW COMPARISON:  None Available. FINDINGS: An orogastric tube is seen with its distal tip overlying the expected region of the body of the stomach. Its distal side hole sits approximately 2.0 cm distal to the expected  region of the gastroesophageal junction. The bowel gas pattern is normal. Surgical clips are seen overlying the right upper quadrant. A mild amount of radiopaque contrast is seen within the left renal collecting system. IMPRESSION: Orogastric tube positioning, as described above. Electronically Signed   By: Aram Candela M.D.   On: 05/05/2023 00:14   DG Chest Port 1 View  Result Date: 05/05/2023 CLINICAL DATA:  Level 1 trauma, status post intubation. EXAM: PORTABLE CHEST 1 VIEW COMPARISON:  05/22/2023 (10:29 p.m.) FINDINGS: Since the prior study there is been interval endotracheal tube placement, with its distal tip approximately 2.9 cm from the carina. Interval enteric tube placement is also seen with its distal end extending into the body of the stomach. The heart size and mediastinal contours are within normal limits. Low lung volumes are seen with mild atelectatic changes present within the bilateral lung bases. No pleural effusion or pneumothorax is identified. The visualized skeletal structures are unremarkable. IMPRESSION: 1. Interval endotracheal and enteric tube placement, as described above. 2. Low lung volumes with  mild bibasilar atelectasis. Electronically Signed   By: Aram Candela M.D.   On: 05/05/2023 00:13   CT CHEST ABDOMEN PELVIS W CONTRAST  Result Date: May 11, 2023 CLINICAL DATA:  Level 1 trauma.  Fall. EXAM: CT CHEST, ABDOMEN, AND PELVIS WITH CONTRAST TECHNIQUE: Multidetector CT imaging of the chest, abdomen and pelvis was performed following the standard protocol during bolus administration of intravenous contrast. RADIATION DOSE REDUCTION: This exam was performed according to the departmental dose-optimization program which includes automated exposure control, adjustment of the mA and/or kV according to patient size and/or use of iterative reconstruction technique. CONTRAST:  75mL OMNIPAQUE IOHEXOL 350 MG/ML SOLN COMPARISON:  None Available. FINDINGS: CT CHEST FINDINGS  Cardiovascular: There is no cardiomegaly or pericardial effusion. Advanced 3 vessel coronary vascular calcification. There is severe calcified and noncalcified plaque of the thoracic aorta. No aneurysmal dilatation or dissection. The origins of the great vessels of the aortic arch and the central pulmonary arteries appear patent. Mediastinum/Nodes: No hilar or mediastinal adenopathy. The esophagus and thyroid gland are grossly unremarkable. No mediastinal fluid collection. Lungs/Pleura: Background of emphysema. No focal consolidation, pleural effusion, or pneumothorax. Small amount of mucous debris in the trachea. The central airways remain patent. Musculoskeletal: No acute osseous pathology. CT ABDOMEN PELVIS FINDINGS No intra-abdominal free air or free fluid. Hepatobiliary: The liver is unremarkable. There is biliary dilatation, post cholecystectomy. No retained calcified stone noted in the central CBD. Pancreas: Unremarkable. No pancreatic ductal dilatation or surrounding inflammatory changes. Spleen: Normal in size without focal abnormality. Adrenals/Urinary Tract: The left adrenal glands unremarkable. There is a 3.5 cm right adrenal myelolipoma. There is a solitary left kidney. No hydronephrosis. Subcentimeter left renal hypodense lesions are too small to characterize. The left ureter and urinary bladder appear unremarkable. Stomach/Bowel: There is sigmoid diverticulosis without active inflammatory changes. There is moderate stool throughout the colon. There is no bowel obstruction or active inflammation. The appendix is normal. Vascular/Lymphatic: Advanced aortoiliac atherosclerotic disease. There is a 3 cm partially thrombosed infrarenal abdominal aortic aneurysm. 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 Left common iliac artery stent is noted which is suboptimally evaluated on the CT. There is flow within the left external  iliac artery. A femorofemoral bypass graft appears patent. The IVC is unremarkable. No portal venous gas. There is no adenopathy. Reproductive: The prostate and seminal vesicles are grossly unremarkable. Other: None Musculoskeletal: Osteopenia with degenerative changes of the spine. No acute osseous pathology. IMPRESSION: 1. No acute/traumatic intrathoracic, abdominal, or pelvic pathology. 2. 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 Patent femorofemoral bypass graft. 3. Sigmoid diverticulosis. No bowel obstruction. Normal appendix. 4. A 3.5 cm right adrenal myelolipoma. Recommend follow-up ultrasound every 3 years. This recommendation follows ACR consensus guidelines: White Paper of the ACR Incidental Findings Committee II on Vascular Findings. J Am Coll Radiol 2013; 10:789-794. 5. Aortic Atherosclerosis (ICD10-I70.0) and Emphysema (ICD10-J43.9). These results were called by telephone at the time of interpretation on 2023/05/11 at 11:14 pm to Dr. Derrell Lolling, who verbally acknowledged these results. Electronically Signed   By: Elgie Collard M.D.   On: 05/11/23 23:58   CT CERVICAL SPINE WO CONTRAST  Result Date: 05/11/2023 CLINICAL DATA:  Trauma.  Altered mental status. EXAM: CT CERVICAL SPINE WITHOUT CONTRAST TECHNIQUE: Multidetector CT imaging of the cervical spine was performed without intravenous contrast. Multiplanar CT image reconstructions were also generated. RADIATION  DOSE REDUCTION: This exam was performed according to the departmental dose-optimization program which includes automated exposure control, adjustment of the mA and/or kV according to patient size and/or use of iterative reconstruction technique. COMPARISON:  None Available. FINDINGS: Alignment: No acute subluxation. Skull base and vertebrae: No acute fracture.  Osteopenia. Soft tissues and spinal canal: No prevertebral fluid or swelling. No visible canal hematoma.  Disc levels:  No acute findings.  Degenerative changes. Upper chest: Emphysema. Other: Bilateral carotid bulb calcified plaques. IMPRESSION: 1. No acute/traumatic cervical spine pathology. 2.  Emphysema (ICD10-J43.9). These results were called by telephone at the time of interpretation on 05/04/2023 at 11:14 pm to Dr. Derrell Lolling, who verbally acknowledged these results. Electronically Signed   By: Elgie Collard M.D.   On: 05/04/2023 23:34   CT ANGIO HEAD NECK W WO CM  Result Date: 05/04/2023 CLINICAL DATA:  Altered mental status EXAM: CT ANGIOGRAPHY HEAD AND NECK WITH AND WITHOUT CONTRAST TECHNIQUE: Multidetector CT imaging of the head and neck was performed using the standard protocol during bolus administration of intravenous contrast. Multiplanar CT image reconstructions and MIPs were obtained to evaluate the vascular anatomy. Carotid stenosis measurements (when applicable) are obtained utilizing NASCET criteria, using the distal internal carotid diameter as the denominator. RADIATION DOSE REDUCTION: This exam was performed according to the departmental dose-optimization program which includes automated exposure control, adjustment of the mA and/or kV according to patient size and/or use of iterative reconstruction technique. CONTRAST:  75mL OMNIPAQUE IOHEXOL 350 MG/ML SOLN COMPARISON:  None Available. FINDINGS: CT HEAD FINDINGS Brain: There is a massive intraparenchymal hematoma centered within the left corona radiata with extension into the overlying subarachnoid space. There is an apparent hematocrit level within the anterior aspect of the hematoma. The hematoma measures 6.8 x 4.6 x 3.9 cm (60 mL). There is rightward midline shift of approximately 6 mm. Subarachnoid blood extends into the left sylvian fissure. There is a small focus of hemorrhage along the right superior temporal gyrus (series 3, image 19) that measures 6 mm. There is confluent white matter hypoattenuation suggestive of chronic small vessel  disease. Vascular: Nonspecific mildly hyperdense left MCA. Otherwise unremarkable. Skull: No skull fracture. Sinuses/Orbits: Fluid in left maxillary sinus. Other: Critical Value/emergent results were called by telephone at the time of interpretation on 05/04/2023 at 11:04 pm to provider [facility] , who verbally acknowledged these results. Review of the MIP images confirms the above findings CTA NECK FINDINGS Aortic arch: Mixed density atherosclerosis of the aortic arch with bulky predominantly soft plaque of the proximal descending aorta. There is a normal 3 vessel branch pattern. Atherosclerotic disease of both subclavian arteries, worse on the left. There is an atherosclerotic web crossing the proximal left subclavian artery lumen, but the artery remains widely patent. Right carotid system: There is mild calcific atherosclerosis of the right common carotid artery. Moderate atherosclerosis at the bifurcation, but no hemodynamically significant stenosis by NASCET criteria. The more distal ICA is normal. Left carotid system: Mild atherosclerosis of the common carotid artery with calcification at the bifurcation. No hemodynamically significant stenosis. The ICA is normal. Vertebral arteries: Mildly left dominant system. Both vertebral arteries are normal to the skull base. Skeleton: Mild cervical spondylosis.  No acute finding. Other neck: Negative Upper chest: Biapical paraseptal emphysema. Review of the MIP images confirms the above findings CTA HEAD FINDINGS Anterior circulation: --Intracranial internal carotid arteries: Atherosclerotic calcification of the internal carotid arteries at the skull base without hemodynamically significant stenosis. --Anterior cerebral arteries (ACA): Normal. --Middle cerebral  arteries (MCA): Normal. Posterior circulation: --Vertebral arteries: Normal --Inferior cerebellar arteries: Normal. --Basilar artery: Normal. --Superior cerebellar arteries: Normal. --Posterior cerebral  arteries: Normal. Venous sinuses: Limited visualization due to contrast phase Anatomic variants: None Review of the MIP images confirms the above findings IMPRESSION: 1. A 60 mL intraparenchymal hematoma centered within the left corona radiata with extension into the overlying subarachnoid space. Lobar location in combination with patient age raises suspicion for amyloid angiopathy. If rightward midline shift of approximately 6 mm. 2. Small focus of hemorrhage along the right superior temporal gyrus, measuring 6 mm. Multifocal hemorrhage means metastatic disease must also be considered. 3. No emergent large vessel occlusion or hemodynamically significant stenosis of the head or neck. No aneurysm or vascular malformation. 4. 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 Aortic Atherosclerosis (ICD10-I70.0) and Emphysema (ICD10-J43.9). Critical Value/emergent results were called by telephone at the time of interpretation on May 15, 2023 at 11:04 pm to provider [facility] , who verbally acknowledged these results. Electronically Signed   By: Deatra Robinson M.D.   On: May 15, 2023 23:24   DG Chest Port 1 View  Result Date: May 15, 2023 CLINICAL DATA:  Status post fall off porch. EXAM: PORTABLE CHEST 1 VIEW COMPARISON:  None Available. FINDINGS: The heart size and mediastinal contours are within normal limits. There is marked severity calcification of the aortic arch. Both lungs are clear. The visualized skeletal structures are unremarkable. IMPRESSION: No active disease. Electronically Signed   By: Aram Candela M.D.   On: 05/15/2023 23:23   DG Pelvis Portable  Result Date: 15-May-2023 CLINICAL DATA:  Status post fall. EXAM: PORTABLE PELVIS 1-2 VIEWS COMPARISON:  None Available. FINDINGS: There is no evidence of pelvic fracture or diastasis. No pelvic bone lesions are seen. There is marked severity vascular calcification with a radiopaque stent  seen within the expected region of the left common iliac artery. IMPRESSION: No acute osseous abnormality. Electronically Signed   By: Aram Candela M.D.   On: 05-15-2023 23:22    Microbiology: Recent Results (from the past 240 hour(s))  MRSA Next Gen by PCR, Nasal     Status: None   Collection Time: 05/05/23 12:57 AM   Specimen: Nasal Mucosa; Nasal Swab  Result Value Ref Range Status   MRSA by PCR Next Gen NOT DETECTED NOT DETECTED Final    Comment: (NOTE) The GeneXpert MRSA Assay (FDA approved for NASAL specimens only), is one component of a comprehensive MRSA colonization surveillance program. It is not intended to diagnose MRSA infection nor to guide or monitor treatment for MRSA infections. Test performance is not FDA approved in patients less than 67 years old. Performed at [facility], [address], [place], [place] [address]     Time spent: 20 minutes  Signed:  Marvel Plan, MD PhD Stroke Neurology 05/16/2023 6:38 PM

## 2023-05-25 NOTE — Plan of Care (Signed)
Problem: Clinical Measurements: Goal: Quality of life will improve Outcome: Progressing   Problem: Respiratory: Goal: Verbalizations of increased ease of respirations will increase Outcome: Progressing   Problem: Role Relationship: Goal: Family's ability to cope with current situation will improve Outcome: Progressing Goal: Ability to verbalize concerns, feelings, and thoughts to partner or family member will improve Outcome: Progressing   Problem: Pain Management: Goal: Satisfaction with pain management regimen will improve Outcome: Progressing

## 2023-05-25 DEATH — deceased

## 2023-08-10 ENCOUNTER — Encounter (HOSPITAL_COMMUNITY): Payer: Medicare Other

## 2023-08-10 ENCOUNTER — Ambulatory Visit: Payer: Medicare Other | Admitting: Vascular Surgery
